# Patient Record
Sex: Female | Born: 1940 | Race: Black or African American | Hispanic: No | Smoking: Never smoker
Health system: Southern US, Community
[De-identification: ages and names within clinical notes are randomized; demographics above are authoritative.]

## PROBLEM LIST (undated history)

## (undated) DIAGNOSIS — M810 Age-related osteoporosis without current pathological fracture: Secondary | ICD-10-CM

## (undated) DIAGNOSIS — M1990 Unspecified osteoarthritis, unspecified site: Secondary | ICD-10-CM

## (undated) DIAGNOSIS — H269 Unspecified cataract: Secondary | ICD-10-CM

## (undated) DIAGNOSIS — C50919 Malignant neoplasm of unspecified site of unspecified female breast: Secondary | ICD-10-CM

## (undated) DIAGNOSIS — H409 Unspecified glaucoma: Secondary | ICD-10-CM

## (undated) HISTORY — PX: BREAST SURGERY: SHX581

## (undated) HISTORY — PX: ABDOMINAL HYSTERECTOMY: SHX81

## (undated) HISTORY — DX: Unspecified cataract: H26.9

## (undated) HISTORY — DX: Unspecified glaucoma: H40.9

## (undated) HISTORY — DX: Age-related osteoporosis without current pathological fracture: M81.0

## (undated) HISTORY — DX: Malignant neoplasm of unspecified site of unspecified female breast: C50.919

## (undated) HISTORY — DX: Unspecified osteoarthritis, unspecified site: M19.90

---

## 2012-02-07 DIAGNOSIS — R6889 Other general symptoms and signs: Secondary | ICD-10-CM | POA: Diagnosis not present

## 2012-02-07 DIAGNOSIS — Z1501 Genetic susceptibility to malignant neoplasm of breast: Secondary | ICD-10-CM | POA: Diagnosis not present

## 2012-03-20 DIAGNOSIS — Z1212 Encounter for screening for malignant neoplasm of rectum: Secondary | ICD-10-CM | POA: Diagnosis not present

## 2012-03-20 DIAGNOSIS — Z0000 Encounter for general adult medical examination without abnormal findings: Secondary | ICD-10-CM | POA: Diagnosis not present

## 2012-03-21 DIAGNOSIS — M810 Age-related osteoporosis without current pathological fracture: Secondary | ICD-10-CM | POA: Diagnosis not present

## 2012-03-21 DIAGNOSIS — C50919 Malignant neoplasm of unspecified site of unspecified female breast: Secondary | ICD-10-CM | POA: Diagnosis not present

## 2012-04-07 DIAGNOSIS — Z780 Asymptomatic menopausal state: Secondary | ICD-10-CM | POA: Diagnosis not present

## 2012-04-07 DIAGNOSIS — C50919 Malignant neoplasm of unspecified site of unspecified female breast: Secondary | ICD-10-CM | POA: Diagnosis not present

## 2012-04-07 DIAGNOSIS — M810 Age-related osteoporosis without current pathological fracture: Secondary | ICD-10-CM | POA: Diagnosis not present

## 2012-08-26 DIAGNOSIS — H4011X0 Primary open-angle glaucoma, stage unspecified: Secondary | ICD-10-CM | POA: Diagnosis not present

## 2012-08-26 DIAGNOSIS — H409 Unspecified glaucoma: Secondary | ICD-10-CM | POA: Diagnosis not present

## 2012-08-26 DIAGNOSIS — H524 Presbyopia: Secondary | ICD-10-CM | POA: Diagnosis not present

## 2012-08-28 DIAGNOSIS — C50919 Malignant neoplasm of unspecified site of unspecified female breast: Secondary | ICD-10-CM | POA: Diagnosis not present

## 2012-08-28 DIAGNOSIS — K219 Gastro-esophageal reflux disease without esophagitis: Secondary | ICD-10-CM | POA: Diagnosis not present

## 2012-09-09 DIAGNOSIS — Z23 Encounter for immunization: Secondary | ICD-10-CM | POA: Diagnosis not present

## 2012-12-27 ENCOUNTER — Ambulatory Visit (INDEPENDENT_AMBULATORY_CARE_PROVIDER_SITE_OTHER): Payer: Medicare Other | Admitting: Emergency Medicine

## 2012-12-27 VITALS — BP 144/68 | HR 77 | Temp 99.0°F | Resp 16 | Ht 64.5 in | Wt 128.0 lb

## 2012-12-27 DIAGNOSIS — R42 Dizziness and giddiness: Secondary | ICD-10-CM | POA: Diagnosis not present

## 2012-12-27 LAB — POCT CBC
HCT, POC: 44.9 % (ref 37.7–47.9)
Lymph, poc: 2.1 (ref 0.6–3.4)
MCH, POC: 30.7 pg (ref 27–31.2)
MCHC: 31.8 g/dL (ref 31.8–35.4)
MCV: 96.4 fL (ref 80–97)
MID (cbc): 0.4 (ref 0–0.9)
POC LYMPH PERCENT: 33.1 %L (ref 10–50)
Platelet Count, POC: 187 10*3/uL (ref 142–424)
RDW, POC: 13.8 %
WBC: 6.3 10*3/uL (ref 4.6–10.2)

## 2012-12-27 MED ORDER — MECLIZINE HCL 25 MG PO TABS: 25.0000 mg | ORAL_TABLET | Freq: Three times a day (TID) | ORAL | Status: DC | PRN

## 2012-12-27 NOTE — Progress Notes (Signed)
[facility] [address] [place] [address] [phone]  Date:  12/27/2012   Name:  Wanda Rivera   DOB:  [birth year]   MRN:  [id number]  PCP:  No primary provider on file.    Chief Complaint: Dizziness and Nausea   History of Present Illness:  Wanda Rivera is a 71 y.o. very pleasant female patient who presents with the following:  10 day history of intermittent lightheadedness.  Denies dizziness.  Some nausea with lightheadedness.  No vomiting.  No antecedent illness or injury.  No fever or chills.  No cough, coryza, headache.  No neuro or visual symptoms.  No stool change.  No rash.  No difficulty with gait balance or coordination.  No chest pain, tightness or shortness of breath or tachycardia or palpitations.  No slurred speech, weakness, facial asymmetry, no dysphasia.  Describes lightheadedness as being postural and not associated with position changes or her head.  There is no problem list on file for this patient.   Past Medical History  Diagnosis Date  . Arthritis   . Cancer   . Osteoporosis     Past Surgical History  Procedure Date  . Breast surgery   . Abdominal hysterectomy     History  Substance Use Topics  . Smoking status: Never Smoker   . Smokeless tobacco: Not on file  . Alcohol Use: No    Family History  Problem Relation Age of Onset  . Cancer Mother   . Diabetes Father   . Hypertension Sister   . Diabetes Brother   . Heart disease Brother     No Known Allergies  Medication list has been reviewed and updated.  Current Outpatient Prescriptions on File Prior to Visit  Medication Sig Dispense Refill  . omeprazole (PRILOSEC) 20 MG capsule Take 20 mg by mouth 2 (two) times daily.        Review of Systems:  As per HPI, otherwise negative.    Physical Examination: Filed Vitals:   12/27/12 1624  BP: 144/68  Pulse: 77  Temp: 99 F (37.2 C)  Resp: 16   Filed Vitals:   12/27/12 1624  Height: 5' 4.5" (1.638  m)  Weight: 128 lb (58.06 kg)   Body mass index is 21.63 kg/(m^2). Ideal Body Weight: Weight in (lb) to have BMI = 25: 147.6   GEN: WDWN, NAD, Non-toxic, A & O x 3  No sepsis or rash HEENT: Atraumatic, Normocephalic. Neck supple. No masses, No LAD.  Oropharynx negative.  PRRERLA EOMI CN2-12 intact Ears and Nose: No external deformity.  TM negative CV: RRR, No M/G/R. No JVD. No thrill. No extra heart sounds. PULM: CTA B, no wheezes, crackles, rhonchi. No retractions. No resp. distress. No accessory muscle use. ABD: S, NT, ND, +BS. No rebound. No HSM. EXTR: No c/c/e NEURO Normal gait, balance and coordination PSYCH: Normally interactive. Conversant. Not depressed or anxious appearing.  Calm demeanor.    Assessment and Plan:  Lightheadedness Labs Follow up based on labs antivert for nausea Consider cardiology consult after labs   Carmelina Dane, MD  Results for orders placed in visit on 12/27/12  POCT CBC      Component Value Range   WBC 6.3  4.6 - 10.2 K/uL   Lymph, poc 2.1  0.6 - 3.4   POC LYMPH PERCENT 33.1  10 - 50 %L   MID (cbc) 0.4  0 - 0.9   POC MID % 6.9  0 -  12 %M   POC Granulocyte 3.8  2 - 6.9   Granulocyte percent 60.0  37 - 80 %G   RBC 4.66  4.04 - 5.48 M/uL   Hemoglobin 14.3  12.2 - 16.2 g/dL   HCT, POC 96.0  45.4 - 47.9 %   MCV 96.4  80 - 97 fL   MCH, POC 30.7  27 - 31.2 pg   MCHC 31.8  31.8 - 35.4 g/dL   RDW, POC 09.8     Platelet Count, POC 187  142 - 424 K/uL   MPV 9.1  0 - 99.8 fL

## 2012-12-28 LAB — COMPREHENSIVE METABOLIC PANEL
AST: 20 U/L (ref 0–37)
Albumin: 4.1 g/dL (ref 3.5–5.2)
Alkaline Phosphatase: 73 U/L (ref 39–117)
BUN: 18 mg/dL (ref 6–23)
Creat: 0.99 mg/dL (ref 0.50–1.10)
Glucose, Bld: 93 mg/dL (ref 70–99)
Potassium: 4.2 mEq/L (ref 3.5–5.3)
Total Bilirubin: 0.4 mg/dL (ref 0.3–1.2)

## 2013-01-05 ENCOUNTER — Telehealth: Payer: Self-pay

## 2013-01-05 NOTE — Telephone Encounter (Signed)
Pt would like to know the results of the labs that she had done on 12/27/12, she is worried because she has not heard anything from [place]. Please advise.  Betst# [phone]

## 2013-01-06 NOTE — Telephone Encounter (Signed)
Can you please review pt labs?

## 2013-01-06 NOTE — Telephone Encounter (Signed)
Please let patient know they are normal

## 2013-01-09 DIAGNOSIS — H4011X0 Primary open-angle glaucoma, stage unspecified: Secondary | ICD-10-CM | POA: Diagnosis not present

## 2013-01-11 NOTE — Telephone Encounter (Signed)
Pt.notified

## 2013-01-22 DIAGNOSIS — H4011X0 Primary open-angle glaucoma, stage unspecified: Secondary | ICD-10-CM | POA: Diagnosis not present

## 2013-02-16 DIAGNOSIS — H4011X0 Primary open-angle glaucoma, stage unspecified: Secondary | ICD-10-CM | POA: Diagnosis not present

## 2013-03-09 DIAGNOSIS — H4011X0 Primary open-angle glaucoma, stage unspecified: Secondary | ICD-10-CM | POA: Diagnosis not present

## 2013-03-30 DIAGNOSIS — H4011X0 Primary open-angle glaucoma, stage unspecified: Secondary | ICD-10-CM | POA: Diagnosis not present

## 2013-04-09 DIAGNOSIS — E785 Hyperlipidemia, unspecified: Secondary | ICD-10-CM | POA: Diagnosis not present

## 2013-04-09 DIAGNOSIS — Z0000 Encounter for general adult medical examination without abnormal findings: Secondary | ICD-10-CM | POA: Diagnosis not present

## 2013-04-09 DIAGNOSIS — R109 Unspecified abdominal pain: Secondary | ICD-10-CM | POA: Diagnosis not present

## 2013-04-09 DIAGNOSIS — Z1501 Genetic susceptibility to malignant neoplasm of breast: Secondary | ICD-10-CM | POA: Diagnosis not present

## 2013-04-09 DIAGNOSIS — K219 Gastro-esophageal reflux disease without esophagitis: Secondary | ICD-10-CM | POA: Diagnosis not present

## 2013-04-09 DIAGNOSIS — M810 Age-related osteoporosis without current pathological fracture: Secondary | ICD-10-CM | POA: Diagnosis not present

## 2013-04-09 DIAGNOSIS — Z124 Encounter for screening for malignant neoplasm of cervix: Secondary | ICD-10-CM | POA: Diagnosis not present

## 2013-04-09 DIAGNOSIS — R42 Dizziness and giddiness: Secondary | ICD-10-CM | POA: Diagnosis not present

## 2013-04-10 DIAGNOSIS — Z79899 Other long term (current) drug therapy: Secondary | ICD-10-CM | POA: Diagnosis not present

## 2013-04-10 DIAGNOSIS — E785 Hyperlipidemia, unspecified: Secondary | ICD-10-CM | POA: Diagnosis not present

## 2013-04-17 DIAGNOSIS — N949 Unspecified condition associated with female genital organs and menstrual cycle: Secondary | ICD-10-CM | POA: Diagnosis not present

## 2013-04-17 DIAGNOSIS — R109 Unspecified abdominal pain: Secondary | ICD-10-CM | POA: Diagnosis not present

## 2013-04-17 DIAGNOSIS — Z853 Personal history of malignant neoplasm of breast: Secondary | ICD-10-CM | POA: Diagnosis not present

## 2013-05-05 DIAGNOSIS — H4011X0 Primary open-angle glaucoma, stage unspecified: Secondary | ICD-10-CM | POA: Diagnosis not present

## 2013-06-23 DIAGNOSIS — H4011X0 Primary open-angle glaucoma, stage unspecified: Secondary | ICD-10-CM | POA: Diagnosis not present

## 2013-07-28 DIAGNOSIS — H409 Unspecified glaucoma: Secondary | ICD-10-CM | POA: Diagnosis not present

## 2013-07-28 DIAGNOSIS — H4011X0 Primary open-angle glaucoma, stage unspecified: Secondary | ICD-10-CM | POA: Diagnosis not present

## 2013-08-02 ENCOUNTER — Ambulatory Visit (INDEPENDENT_AMBULATORY_CARE_PROVIDER_SITE_OTHER): Payer: Medicare Other | Admitting: Family Medicine

## 2013-08-02 VITALS — BP 142/80 | HR 55 | Temp 98.0°F | Resp 16 | Ht 65.0 in | Wt 129.2 lb

## 2013-08-02 DIAGNOSIS — R110 Nausea: Secondary | ICD-10-CM | POA: Diagnosis not present

## 2013-08-02 DIAGNOSIS — R001 Bradycardia, unspecified: Secondary | ICD-10-CM

## 2013-08-02 DIAGNOSIS — R42 Dizziness and giddiness: Secondary | ICD-10-CM | POA: Diagnosis not present

## 2013-08-02 DIAGNOSIS — R2681 Unsteadiness on feet: Secondary | ICD-10-CM

## 2013-08-02 DIAGNOSIS — R51 Headache: Secondary | ICD-10-CM | POA: Diagnosis not present

## 2013-08-02 LAB — COMPREHENSIVE METABOLIC PANEL WITH GFR
Albumin: 4.3 g/dL (ref 3.5–5.2)
Alkaline Phosphatase: 78 U/L (ref 39–117)
BUN: 13 mg/dL (ref 6–23)
CO2: 29 meq/L (ref 19–32)
Calcium: 9.7 mg/dL (ref 8.4–10.5)
Chloride: 102 meq/L (ref 96–112)
Glucose, Bld: 97 mg/dL (ref 70–99)
Potassium: 5 meq/L (ref 3.5–5.3)
Total Protein: 6.7 g/dL (ref 6.0–8.3)

## 2013-08-02 LAB — POCT URINALYSIS DIPSTICK
Bilirubin, UA: NEGATIVE
Blood, UA: NEGATIVE
Glucose, UA: NEGATIVE
Ketones, UA: NEGATIVE
Leukocytes, UA: NEGATIVE
Nitrite, UA: NEGATIVE
Protein, UA: NEGATIVE
Spec Grav, UA: 1.01
Urobilinogen, UA: 0.2
pH, UA: 7

## 2013-08-02 LAB — POCT UA - MICROSCOPIC ONLY
Bacteria, U Microscopic: NEGATIVE
Casts, Ur, LPF, POC: NEGATIVE
Crystals, Ur, HPF, POC: NEGATIVE
Epithelial cells, urine per micros: NEGATIVE
Mucus, UA: NEGATIVE
RBC, urine, microscopic: NEGATIVE
WBC, Ur, HPF, POC: NEGATIVE
Yeast, UA: NEGATIVE

## 2013-08-02 LAB — POCT CBC
Granulocyte percent: 70.6 %G (ref 37–80)
HCT, POC: 45.4 % (ref 37.7–47.9)
Hemoglobin: 14.7 g/dL (ref 12.2–16.2)
Lymph, poc: 1.5 (ref 0.6–3.4)
MCH, POC: 31.3 pg — AB (ref 27–31.2)
MCHC: 32.4 g/dL (ref 31.8–35.4)
MCV: 96.5 fL (ref 80–97)
MID (cbc): 0.3 (ref 0–0.9)
MPV: 9.5 fL (ref 0–99.8)
POC Granulocyte: 4.4 (ref 2–6.9)
POC LYMPH PERCENT: 24.2 %L (ref 10–50)
POC MID %: 5.2 % (ref 0–12)
Platelet Count, POC: 188 10*3/uL (ref 142–424)
RBC: 4.7 M/uL (ref 4.04–5.48)
RDW, POC: 13.8 %
WBC: 6.3 10*3/uL (ref 4.6–10.2)

## 2013-08-02 LAB — COMPREHENSIVE METABOLIC PANEL
ALT: 11 U/L (ref 0–35)
AST: 21 U/L (ref 0–37)
Creat: 1.07 mg/dL (ref 0.50–1.10)
Sodium: 140 mEq/L (ref 135–145)
Total Bilirubin: 0.6 mg/dL (ref 0.3–1.2)

## 2013-08-02 NOTE — Patient Instructions (Addendum)
Vertigo Vertigo means you feel like you or your surroundings are moving when they are not. Vertigo can be dangerous if it occurs when you are at work, driving, or performing difficult activities.  CAUSES  Vertigo occurs when there is a conflict of signals sent to your brain from the visual and sensory systems in your body. There are many different causes of vertigo, including:  Infections, especially in the inner ear.  A bad reaction to a drug or misuse of alcohol and medicines.  Withdrawal from drugs or alcohol.  Rapidly changing positions, such as lying down or rolling over in bed.  A migraine headache.  Decreased blood flow to the brain.  Increased pressure in the brain from a head injury, infection, tumor, or bleeding. SYMPTOMS  You may feel as though the world is spinning around or you are falling to the ground. Because your balance is upset, vertigo can cause nausea and vomiting. You may have involuntary eye movements (nystagmus). DIAGNOSIS  Vertigo is usually diagnosed by physical exam. If the cause of your vertigo is unknown, your caregiver may perform imaging tests, such as an MRI scan (magnetic resonance imaging). TREATMENT  Most cases of vertigo resolve on their own, without treatment. Depending on the cause, your caregiver may prescribe certain medicines. If your vertigo is related to body position issues, your caregiver may recommend movements or procedures to correct the problem. In rare cases, if your vertigo is caused by certain inner ear problems, you may need surgery. HOME CARE INSTRUCTIONS   Follow your caregiver's instructions.  Avoid driving.  Avoid operating heavy machinery.  Avoid performing any tasks that would be dangerous to you or others during a vertigo episode.  Tell your caregiver if you notice that certain medicines seem to be causing your vertigo. Some of the medicines used to treat vertigo episodes can actually make them worse in some people. SEEK  IMMEDIATE MEDICAL CARE IF:   Your medicines do not relieve your vertigo or are making it worse.  You develop problems with talking, walking, weakness, or using your arms, hands, or legs.  You develop severe headaches.  Your nausea or vomiting continues or gets worse.  You develop visual changes.  A family member notices behavioral changes.  Your condition gets worse. MAKE SURE YOU:  Understand these instructions.  Will watch your condition.  Will get help right away if you are not doing well or get worse. Document Released: 09/26/2005 Document Revised: 03/10/2012 Document Reviewed: 07/05/2011 Exit[employer] Patient Information 2014 [employer].  

## 2013-08-02 NOTE — Progress Notes (Signed)
[facility]:  Office Visit  Chief Complaint:  Chief Complaint  Patient presents with  . Nausea    x 2 days but worse today  . Headache  . Dizziness    HPI: Wanda Rivera is a 72 y.o. female who complains of  3 days ago of feeling stomach feeling out of sorts, this morning she was leaning down to get something from laundry basketball and she was dizzy when she tried to get up.  She feels nauseated. She has HA with this on the top of her head and also temporal area. She does have reflux. Her sister had a subarachnoid hemorrhage. She has pressure along her nasal area, she has lightheadedness when she moves her head. Was previosuly treated for vertigo and took medicine that was rx by Dr. Dareen Piano for a couple of months which helped. She denies having any CP, assymetric weakness, facial changes, stroke like sxs, confusion.  She does not know if moking her head makes it worse. She knows that walking can make her feel dizzy. Denies any URI sxs, ear infections. She denies any light sensitivity/photophobia, vision changes. No risk factors for heart disease ie DM, HTN, tobacco use, XOL.  She moved here from [place], she only has one son and he is here in the [employer]  She is a breast cancer survivor.   Past Medical History  Diagnosis Date  . Arthritis   . Osteoporosis   . Cataract   . Glaucoma   . Cancer     1982, recurrence in 1985   Past Surgical History  Procedure Laterality Date  . Abdominal hysterectomy    . Breast surgery      double masectomy for breast cancer   History   Social History  . Marital Status: Divorced    Spouse Name: N/A    Number of Children: N/A  . Years of Education: N/A   Social History Main Topics  . Smoking status: Never Smoker   . Smokeless tobacco: None  . Alcohol Use: No  . Drug Use: No  . Sexually Active: No   Other Topics Concern  . None   Social History Narrative  . None   Family History  Problem Relation Age of  Onset  . Cancer Mother   . Diabetes Father   . Hypertension Father   . Hypertension Sister   . Diabetes Brother   . Heart disease Brother    No Known Allergies Prior to Admission medications   Medication Sig Start Date End Date Taking? Authorizing Provider  omeprazole (PRILOSEC) 20 MG capsule Take 20 mg by mouth 2 (two) times daily.   Yes Historical Provider, MD  meclizine (ANTIVERT) 25 MG tablet Take 1 tablet (25 mg total) by mouth 3 (three) times daily as needed. 12/27/12   Phillips Odor, MD     ROS: The patient denies fevers, chills, night sweats, unintentional weight loss, chest pain, palpitations, wheezing, dyspnea on exertion, nausea, vomiting, abdominal pain, dysuria, hematuria, melena, numbness, weakness, or tingling.   All other systems have been reviewed and were otherwise negative with the exception of those mentioned in the HPI and as above.    PHYSICAL EXAM: Filed Vitals:   08/02/13 1352  BP: 142/80  Pulse: 55  Temp: 98 F (36.7 C)  Resp: 16   Filed Vitals:   08/02/13 1352  Height: 5\' 5"  (1.651 m)  Weight: 129 lb 3.2 oz (58.605 kg)   Body mass index is 21.5 kg/(m^2).  General: Alert, no acute distress HEENT:  Normocephalic, atraumatic, oropharynx patent. EOMI, PERRLA , fundoscopic exam nl Cardiovascular:  Regular rate and rhythm, no rubs murmurs or gallops.  No Carotid bruits, radial pulse intact. No pedal edema.  Respiratory: Clear to auscultation bilaterally.  No wheezes, rales, or rhonchi.  No cyanosis, no use of accessory musculature GI: No organomegaly, abdomen is soft and non-tender, positive bowel sounds.  No masses. Skin: No rashes. Neurologic: Facial musculature symmetric. CN 2-11 grossly normal Psychiatric: Patient is appropriate throughout our interaction. Lymphatic: No cervical lymphadenopathy Musculoskeletal: Generalized walking gait intact. Heel to toe--failed, she had unstable gait with this  Romberg was negative Gilberto Better was  negative   LABS: Results for orders placed in visit on 08/02/13  POCT CBC      Result Value Range   WBC 6.3  4.6 - 10.2 K/uL   Lymph, poc 1.5  0.6 - 3.4   POC LYMPH PERCENT 24.2  10 - 50 %L   MID (cbc) 0.3  0 - 0.9   POC MID % 5.2  0 - 12 %M   POC Granulocyte 4.4  2 - 6.9   Granulocyte percent 70.6  37 - 80 %G   RBC 4.70  4.04 - 5.48 M/uL   Hemoglobin 14.7  12.2 - 16.2 g/dL   HCT, POC 40.9  81.1 - 47.9 %   MCV 96.5  80 - 97 fL   MCH, POC 31.3 (*) 27 - 31.2 pg   MCHC 32.4  31.8 - 35.4 g/dL   RDW, POC 91.4     Platelet Count, POC 188  142 - 424 K/uL   MPV 9.5  0 - 99.8 fL  POCT UA - MICROSCOPIC ONLY      Result Value Range   WBC, Ur, HPF, POC neg     RBC, urine, microscopic neg     Bacteria, U Microscopic neg     Mucus, UA neg     Epithelial cells, urine per micros neg     Crystals, Ur, HPF, POC neg     Casts, Ur, LPF, POC neg     Yeast, UA neg    POCT URINALYSIS DIPSTICK      Result Value Range   Color, UA yellow     Clarity, UA clear     Glucose, UA neg     Bilirubin, UA neg     Ketones, UA neg     Spec Grav, UA 1.010     Blood, UA neg     pH, UA 7.0     Protein, UA neg     Urobilinogen, UA 0.2     Nitrite, UA neg     Leukocytes, UA Negative       EKG/XRAY:   Primary read interpreted by Dr. Conley Rolls at [facility]. Sinus brady at 54 bpm, no ST elevation   ASSESSMENT/PLAN: Encounter Diagnoses  Name Primary?  . Nausea alone Yes  . Dizziness   . Headache(784.0)    Sinus Bradycardia   . Unsteady gait    She most likely has an element of orthostatic BP and benign positional vertigo since usually with getting up nad down/head movement Her gait was unsteady with heel-to-toe, recommend CT Head scan if no improvement with meclizine otc. She declines to get t CT scan of head.  She also had sinus bradycardia which was not evident on last office visit 6 months ago. I will  refer to cardiology to make sure this is not the  cause of her sxs.  She will go to the ER prn for  worsening sxs.  Labs pending F/u prn    LE, THAO PHUONG, DO 08/02/2013 3:30 PM   08/05/13-spoke with patient she is doing about the same, increase meclizine to TID which she has been on ly taking daily. Labs were normal. Will refer and she will f.u prn if worsenign sxs or go to ER

## 2013-08-18 DIAGNOSIS — R42 Dizziness and giddiness: Secondary | ICD-10-CM | POA: Diagnosis not present

## 2013-08-18 DIAGNOSIS — I498 Other specified cardiac arrhythmias: Secondary | ICD-10-CM | POA: Diagnosis not present

## 2013-09-08 DIAGNOSIS — H2510 Age-related nuclear cataract, unspecified eye: Secondary | ICD-10-CM | POA: Diagnosis not present

## 2013-09-08 DIAGNOSIS — H4010X0 Unspecified open-angle glaucoma, stage unspecified: Secondary | ICD-10-CM | POA: Diagnosis not present

## 2013-09-08 DIAGNOSIS — H18419 Arcus senilis, unspecified eye: Secondary | ICD-10-CM | POA: Diagnosis not present

## 2013-09-08 DIAGNOSIS — H409 Unspecified glaucoma: Secondary | ICD-10-CM | POA: Diagnosis not present

## 2013-09-21 DIAGNOSIS — Z23 Encounter for immunization: Secondary | ICD-10-CM | POA: Diagnosis not present

## 2013-09-21 DIAGNOSIS — K219 Gastro-esophageal reflux disease without esophagitis: Secondary | ICD-10-CM | POA: Diagnosis not present

## 2013-09-21 DIAGNOSIS — R42 Dizziness and giddiness: Secondary | ICD-10-CM | POA: Diagnosis not present

## 2013-09-21 DIAGNOSIS — M810 Age-related osteoporosis without current pathological fracture: Secondary | ICD-10-CM | POA: Diagnosis not present

## 2013-09-24 DIAGNOSIS — Z853 Personal history of malignant neoplasm of breast: Secondary | ICD-10-CM | POA: Diagnosis not present

## 2013-09-24 DIAGNOSIS — R42 Dizziness and giddiness: Secondary | ICD-10-CM | POA: Diagnosis not present

## 2013-09-24 DIAGNOSIS — R51 Headache: Secondary | ICD-10-CM | POA: Diagnosis not present

## 2013-10-06 DIAGNOSIS — H4010X0 Unspecified open-angle glaucoma, stage unspecified: Secondary | ICD-10-CM | POA: Diagnosis not present

## 2013-10-06 DIAGNOSIS — H409 Unspecified glaucoma: Secondary | ICD-10-CM | POA: Diagnosis not present

## 2013-10-13 DIAGNOSIS — H4011X0 Primary open-angle glaucoma, stage unspecified: Secondary | ICD-10-CM | POA: Diagnosis not present

## 2013-10-27 DIAGNOSIS — H409 Unspecified glaucoma: Secondary | ICD-10-CM | POA: Diagnosis not present

## 2013-10-27 DIAGNOSIS — H4010X0 Unspecified open-angle glaucoma, stage unspecified: Secondary | ICD-10-CM | POA: Diagnosis not present

## 2013-11-03 DIAGNOSIS — H4011X0 Primary open-angle glaucoma, stage unspecified: Secondary | ICD-10-CM | POA: Diagnosis not present

## 2014-03-05 DIAGNOSIS — H4011X0 Primary open-angle glaucoma, stage unspecified: Secondary | ICD-10-CM | POA: Diagnosis not present

## 2014-03-05 DIAGNOSIS — H2510 Age-related nuclear cataract, unspecified eye: Secondary | ICD-10-CM | POA: Diagnosis not present

## 2014-03-05 DIAGNOSIS — H409 Unspecified glaucoma: Secondary | ICD-10-CM | POA: Diagnosis not present

## 2014-05-17 DIAGNOSIS — M810 Age-related osteoporosis without current pathological fracture: Secondary | ICD-10-CM | POA: Diagnosis not present

## 2014-05-17 DIAGNOSIS — C50919 Malignant neoplasm of unspecified site of unspecified female breast: Secondary | ICD-10-CM | POA: Diagnosis not present

## 2014-05-17 DIAGNOSIS — E785 Hyperlipidemia, unspecified: Secondary | ICD-10-CM | POA: Diagnosis not present

## 2014-05-17 DIAGNOSIS — Z0000 Encounter for general adult medical examination without abnormal findings: Secondary | ICD-10-CM | POA: Diagnosis not present

## 2014-05-17 DIAGNOSIS — K219 Gastro-esophageal reflux disease without esophagitis: Secondary | ICD-10-CM | POA: Diagnosis not present

## 2014-05-17 DIAGNOSIS — R42 Dizziness and giddiness: Secondary | ICD-10-CM | POA: Diagnosis not present

## 2014-05-18 DIAGNOSIS — Z01419 Encounter for gynecological examination (general) (routine) without abnormal findings: Secondary | ICD-10-CM | POA: Diagnosis not present

## 2014-05-18 DIAGNOSIS — Z79899 Other long term (current) drug therapy: Secondary | ICD-10-CM | POA: Diagnosis not present

## 2014-05-18 DIAGNOSIS — H409 Unspecified glaucoma: Secondary | ICD-10-CM | POA: Diagnosis not present

## 2014-05-18 DIAGNOSIS — E785 Hyperlipidemia, unspecified: Secondary | ICD-10-CM | POA: Diagnosis not present

## 2014-05-18 DIAGNOSIS — Z1501 Genetic susceptibility to malignant neoplasm of breast: Secondary | ICD-10-CM | POA: Diagnosis not present

## 2014-06-14 DIAGNOSIS — H2510 Age-related nuclear cataract, unspecified eye: Secondary | ICD-10-CM | POA: Diagnosis not present

## 2014-06-14 DIAGNOSIS — H409 Unspecified glaucoma: Secondary | ICD-10-CM | POA: Diagnosis not present

## 2014-06-14 DIAGNOSIS — H4011X0 Primary open-angle glaucoma, stage unspecified: Secondary | ICD-10-CM | POA: Diagnosis not present

## 2014-07-23 ENCOUNTER — Ambulatory Visit (INDEPENDENT_AMBULATORY_CARE_PROVIDER_SITE_OTHER): Payer: Medicare Other

## 2014-07-23 ENCOUNTER — Ambulatory Visit (INDEPENDENT_AMBULATORY_CARE_PROVIDER_SITE_OTHER): Payer: Medicare Other | Admitting: Family Medicine

## 2014-07-23 VITALS — BP 138/70 | HR 52 | Temp 97.5°F | Resp 16 | Ht 65.0 in | Wt 135.0 lb

## 2014-07-23 DIAGNOSIS — M25519 Pain in unspecified shoulder: Secondary | ICD-10-CM | POA: Diagnosis not present

## 2014-07-23 DIAGNOSIS — S43499A Other sprain of unspecified shoulder joint, initial encounter: Secondary | ICD-10-CM

## 2014-07-23 DIAGNOSIS — M25511 Pain in right shoulder: Secondary | ICD-10-CM

## 2014-07-23 DIAGNOSIS — S46811A Strain of other muscles, fascia and tendons at shoulder and upper arm level, right arm, initial encounter: Secondary | ICD-10-CM

## 2014-07-23 DIAGNOSIS — S46819A Strain of other muscles, fascia and tendons at shoulder and upper arm level, unspecified arm, initial encounter: Secondary | ICD-10-CM

## 2014-07-23 NOTE — Progress Notes (Addendum)
[facility] [address], [place] [address] [phone]  Date:  07/23/2014   Name:  Wanda Rivera   DOB:  October 04, 1941   MRN:  [id number]  PCP:  No primary provider on file.    Chief Complaint: Shoulder Pain, Tingling, Neck Pain and Chest Pain   History of Present Illness:  Wanda Rivera is a 73 y.o. very pleasant female patient who presents with the following:  Here today with a possible MSK concern.   It is now Friday night.  On Monday she noted onset of pain in her right shoulder blade.  She is not aware of any particular injury or unusual activity.  Insidious onset.  The pain stayed in her back, and she tried some tylenol.  The pain then moved into the right side of her neck, and then in the right upper chest over the last 2-3 days.  Also, she notes tingling in her right arm.  (She does not have much feeling in her arm to begin with due to her mastectomy.)  She has noted this tingling for a couple of days.   She has never had this sort of problem in the past.    She generally exercises on her eliptical machine; she may do this a few times a week.  No isses with CP or SOB while she is on the eliptical.    Otherwise she feels well, no fever or other systemic sx History of breast cancer and recurrence of same with resultant double mastectomy   There are no active problems to display for this patient.   Past Medical History  Diagnosis Date  . Arthritis   . Osteoporosis   . Cataract   . Glaucoma   . Cancer     1982, recurrence in 1985    Past Surgical History  Procedure Laterality Date  . Abdominal hysterectomy    . Breast surgery      double masectomy for breast cancer    History  Substance Use Topics  . Smoking status: Never Smoker   . Smokeless tobacco: Not on file  . Alcohol Use: No    Family History  Problem Relation Age of Onset  . Cancer Mother   . Diabetes Father   . Hypertension Father   . Hypertension Sister   . Diabetes  Brother   . Heart disease Brother     No Known Allergies  Medication list has been reviewed and updated.  Current Outpatient Prescriptions on File Prior to Visit  Medication Sig Dispense Refill  . omeprazole (PRILOSEC) 20 MG capsule Take 20 mg by mouth 2 (two) times daily.       No current facility-administered medications on file prior to visit.    Review of Systems:  As per HPI- otherwise negative.   Physical Examination: Filed Vitals:   07/23/14 1735  BP: 138/70  Pulse: 60  Temp: 97.5 F (36.4 C)  Resp: 16   Filed Vitals:   07/23/14 1735  Height: 5\' 5"  (1.651 m)  Weight: 135 lb (61.236 kg)   Body mass index is 22.47 kg/(m^2). Ideal Body Weight: Weight in (lb) to have BMI = 25: 149.9  GEN: WDWN, NAD, Non-toxic, A & O x 3, slim build, looks well and younger than age 60: Atraumatic, Normocephalic. Neck supple. No masses, No LAD.  Bilateral TM wnl, oropharynx normal.  PEERL,EOMI.   Normal motion of the cervical spine and no bony tenderness.  Her "neck" tenderness is in the  trapezius muscle Ears and Nose: No external deformity. CV: RRR, No M/G/R. No JVD. No thrill. No extra heart sounds. PULM: CTA B, no wheezes, crackles, rhonchi. No retractions. No resp. distress. No accessory muscle use. ABD: S, NT, ND EXTR: No c/c/e NEURO Normal gait.  PSYCH: Normally interactive. Conversant. Not depressed or anxious appearing.  Calm demeanor.  Normal ROM with no pain in right shoulder.  Tender over the right superior trapezius muscles, and in the right pectoralis especially towards the axilla.  No skin lesion or sign of shingles.   S/p bilateral mastectomy   [facility] reading (PRIMARY) by  Dr. Lorelei Pont. CXR: s/p mastectomy, otherwise negative  CHEST 2 VIEW  COMPARISON: None.  FINDINGS: Cardiac silhouette normal in size. Thoracic aorta mildly atherosclerotic consistent with age. Hilar and mediastinal contours otherwise unremarkable. Lungs clear. Bronchovascular  markings normal. Pulmonary vascularity normal. No visible pleural effusions. No pneumothorax. Visualized bony thorax intact. Bilateral mastectomies and axillary node dissection.  IMPRESSION: No acute cardiopulmonary disease.  No significant discrepancy with the original interpretation by Dr. Lorelei Pont.   EKG: NSR, no ST elevation or depression.  Slight bradycardia  Assessment and Plan: Right shoulder pain - Plan: EKG 12-Lead  Trapezius strain, right, initial encounter - Plan: DG Chest 2 View  Right sided likely MSK pain in the upper back and chest.  Discussed in detail with pt.  My evaluation using the resources I have at clinic does not suggest any dangerous etiology of her pain.  However I am glad to arrange an ER visit for further evaluation and testing as necessary.  At this time she feels comfortable observing her sx and will use tylenol as needed.  As she has had breast cancer twice encouraged her to follow-up if her pain persists beyond a week- in that case a CT might be indicated.   Signed Lamar Blinks, MD

## 2014-07-23 NOTE — Patient Instructions (Addendum)
Use tylenol and a heating pad as needed.  Let me know if your symtoms do not resolve in the next week or so.  If you get worse please seek care right away.

## 2014-08-11 ENCOUNTER — Ambulatory Visit (INDEPENDENT_AMBULATORY_CARE_PROVIDER_SITE_OTHER): Payer: Medicare Other | Admitting: Family Medicine

## 2014-08-11 VITALS — BP 123/72 | HR 54 | Temp 98.6°F | Resp 18 | Wt 131.0 lb

## 2014-08-11 DIAGNOSIS — M25519 Pain in unspecified shoulder: Secondary | ICD-10-CM | POA: Diagnosis not present

## 2014-08-11 DIAGNOSIS — Z853 Personal history of malignant neoplasm of breast: Secondary | ICD-10-CM

## 2014-08-11 DIAGNOSIS — M25511 Pain in right shoulder: Secondary | ICD-10-CM

## 2014-08-11 LAB — COMPLETE METABOLIC PANEL WITH GFR
ALBUMIN: 4.1 g/dL (ref 3.5–5.2)
ALK PHOS: 70 U/L (ref 39–117)
ALT: 14 U/L (ref 0–35)
AST: 23 U/L (ref 0–37)
BUN: 19 mg/dL (ref 6–23)
CO2: 28 mEq/L (ref 19–32)
Calcium: 9.5 mg/dL (ref 8.4–10.5)
Chloride: 105 mEq/L (ref 96–112)
Creat: 1.12 mg/dL — ABNORMAL HIGH (ref 0.50–1.10)
GFR, Est African American: 57 mL/min — ABNORMAL LOW
GFR, Est Non African American: 50 mL/min — ABNORMAL LOW
GLUCOSE: 87 mg/dL (ref 70–99)
POTASSIUM: 5.2 meq/L (ref 3.5–5.3)
SODIUM: 140 meq/L (ref 135–145)
TOTAL PROTEIN: 6.8 g/dL (ref 6.0–8.3)
Total Bilirubin: 0.6 mg/dL (ref 0.2–1.2)

## 2014-08-11 NOTE — Progress Notes (Signed)
Pt has been scheduled for CT Scan 8/13 at [facility] at 5:15pm She needs to RTC for a stat CMP prior to her CT. Pt advised. She will return to clinic- order has been placed.

## 2014-08-11 NOTE — Progress Notes (Signed)
[facility] [address] [place] [address] [phone]  Date:  08/11/2014   Name:  Wanda Rivera   DOB:  [birth year]   MRN:  [id number]  PCP:  No primary provider on file.    Chief Complaint: Follow-up   History of Present Illness:  Wanda Rivera is a 73 y.o. very pleasant female patient who presents with the following:  She was seen here on 7/24 with right shoulder blade pain.  She does not have any CP or SOB.   She is really afraid to take most medications because they affect her so strongly; therefore she has not tried any muscle relaxer or other Rx medication for this issue.  She can take tylenol OTC only.   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  She would like to do further imagine to ensure that all is well.  She had a negative CXR on 07/24/2014.   Otherwise she has felt well.  No skin changes S/p bilateral mastectomy   There are no active problems to display for this patient.   Past Medical History  Diagnosis Date  . Arthritis   . Osteoporosis   . Cataract   . Glaucoma   . Cancer     1982, recurrence in 1985    Past Surgical History  Procedure Laterality Date  . Abdominal hysterectomy    . Breast surgery      double masectomy for breast cancer    History  Substance Use Topics  . Smoking status: Never Smoker   . Smokeless tobacco: Not on file  . Alcohol Use: No    Family History  Problem Relation Age of Onset  . Cancer Mother   . Diabetes Father   . Hypertension Father   . Hypertension Sister   . Diabetes Brother   . Heart disease Brother     No Known Allergies  Medication list has been reviewed and updated.  Current Outpatient Prescriptions on File Prior to Visit  Medication Sig Dispense Refill  . acetaminophen (TYLENOL) 500 MG tablet Take 500 mg by mouth as needed.      Marland Kitchen omeprazole (PRILOSEC) 20 MG capsule  Take 20 mg by mouth 2 (two) times daily.       No current facility-administered medications on file prior to visit.    Review of Systems:  As per HPI- otherwise negative.   Physical Examination: Filed Vitals:   08/11/14 1212  BP: 123/72  Pulse: 49  Temp: 98.6 F (37 C)  Resp: 18   Filed Vitals:   08/11/14 1212  Weight: 131 lb (59.421 kg)   Body mass index is 21.8 kg/(m^2). Ideal Body Weight:    GEN: WDWN, NAD, Non-toxic, A & O x 3, slim build HEENT: Atraumatic, Normocephalic. Neck supple. No masses, No LAD. Ears and Nose: No external deformity. CV: RRR, No M/G/R. No JVD. No thrill. No extra heart sounds.  Chest wall is s/p bilateral mastectomy PULM: CTA B, no wheezes, crackles, rhonchi. No retractions. No resp. distress. No accessory muscle use. EXTR: No c/c/e NEURO Normal gait.  PSYCH: Normally interactive. Conversant. Not depressed or anxious appearing.  Calm demeanor.  She is tender along the medial border of the right scapula.  No swelling, redness, heat or bruising.  No pain with manipulation of the right shoulder. Shoulder with full ROM.    Assessment and  Plan: Right shoulder pain - Plan: CT Chest W Contrast, COMPLETE METABOLIC PANEL WITH GFR  History of breast cancer - Plan: CT Chest W Contrast  Persistent right shoulder pain with history of bilateral breast cancer.  Mahala reasonably would like to make sure there is no evidence of metastatic disease.  Will schedule a CT scan for tomorrow when her son is able to drive her to her appt.   Follow-up pending CT results   Signed Lamar Blinks, MD

## 2014-08-11 NOTE — Patient Instructions (Signed)
We will set up your CT scan for tomorrow and will give you a call with these details.

## 2014-08-12 ENCOUNTER — Ambulatory Visit (HOSPITAL_COMMUNITY)
Admission: RE | Admit: 2014-08-12 | Discharge: 2014-08-12 | Disposition: A | Discharge status: Home / Self Care | Payer: Medicare Other | Source: Ambulatory Visit | Attending: Family Medicine | Admitting: Family Medicine

## 2014-08-12 DIAGNOSIS — Z9010 Acquired absence of unspecified breast and nipple: Secondary | ICD-10-CM | POA: Diagnosis not present

## 2014-08-12 DIAGNOSIS — M25519 Pain in unspecified shoulder: Secondary | ICD-10-CM | POA: Insufficient documentation

## 2014-08-12 DIAGNOSIS — M25511 Pain in right shoulder: Secondary | ICD-10-CM

## 2014-08-12 DIAGNOSIS — Z853 Personal history of malignant neoplasm of breast: Secondary | ICD-10-CM

## 2014-08-12 DIAGNOSIS — J9819 Other pulmonary collapse: Secondary | ICD-10-CM | POA: Diagnosis not present

## 2014-08-12 MED ORDER — IOHEXOL 300 MG/ML  SOLN
80.0000 mL | Freq: Once | INTRAMUSCULAR | Status: AC | PRN
  Administered 2014-08-12: 80 mL via INTRAVENOUS

## 2014-08-14 ENCOUNTER — Telehealth: Payer: Self-pay | Admitting: Family Medicine

## 2014-08-14 ENCOUNTER — Encounter: Payer: Self-pay | Admitting: Family Medicine

## 2014-08-14 NOTE — Telephone Encounter (Signed)
Called and [facility]- CT looks fine.  I will send her a copy, let me know if sx persist

## 2014-08-16 ENCOUNTER — Telehealth: Payer: Self-pay

## 2014-08-16 DIAGNOSIS — H4011X0 Primary open-angle glaucoma, stage unspecified: Secondary | ICD-10-CM | POA: Diagnosis not present

## 2014-08-16 DIAGNOSIS — H2510 Age-related nuclear cataract, unspecified eye: Secondary | ICD-10-CM | POA: Diagnosis not present

## 2014-08-16 DIAGNOSIS — H409 Unspecified glaucoma: Secondary | ICD-10-CM | POA: Diagnosis not present

## 2014-08-16 NOTE — Telephone Encounter (Signed)
Pt calling in regards to mlom by Dr. Lorelei Pont, would like her to know that she is very happy about the report. Pt would like if the results could be mailed the results to her at  [address]

## 2014-08-17 NOTE — Telephone Encounter (Signed)
Per Dr. Serita Grit' note- copy has been mailed to the patient.

## 2014-09-01 ENCOUNTER — Encounter: Payer: Self-pay | Admitting: Family Medicine

## 2014-09-01 ENCOUNTER — Ambulatory Visit (INDEPENDENT_AMBULATORY_CARE_PROVIDER_SITE_OTHER): Payer: Medicare Other | Admitting: Family Medicine

## 2014-09-01 ENCOUNTER — Ambulatory Visit (INDEPENDENT_AMBULATORY_CARE_PROVIDER_SITE_OTHER): Payer: Medicare Other

## 2014-09-01 VITALS — BP 122/64 | HR 60 | Temp 97.7°F | Resp 16 | Ht 64.5 in | Wt 133.6 lb

## 2014-09-01 DIAGNOSIS — M25519 Pain in unspecified shoulder: Secondary | ICD-10-CM | POA: Diagnosis not present

## 2014-09-01 DIAGNOSIS — M47812 Spondylosis without myelopathy or radiculopathy, cervical region: Secondary | ICD-10-CM | POA: Diagnosis not present

## 2014-09-01 DIAGNOSIS — M25511 Pain in right shoulder: Secondary | ICD-10-CM

## 2014-09-01 MED ORDER — PREDNISONE 20 MG PO TABS: ORAL_TABLET | ORAL | Status: DC

## 2014-09-01 NOTE — Patient Instructions (Signed)
We will get you set up to see a spine doctor to see what else may be available to help you.  In the meantime use the steroid as directed (prednisone).   Let me know if you are getting worse or if you have any other concerns.

## 2014-09-01 NOTE — Progress Notes (Signed)
[facility] [address] [place] [address] [phone]  Date:  09/01/2014   Name:  Wanda Rivera   DOB:  [birth year]   MRN:  [id number]  PCP:  No PCP Per Patient    Chief Complaint: Follow-up   History of Present Illness:  Wanda Rivera is a 73 y.o. very pleasant female patient who presents with the following:  Here today to follow-up on persistent right shoulder blade pain.  She was originally seen with this on 7/24- she was concerned due to history of breast cancer and wondered if this could indicate a recurrence. She then came back on 8/12 with the same and we did a CT which was negative  She is here today because she still has this pain.  She is not sure what to do next.   She is able to use her shoulder ok, but if she flexes her neck for a significant period of time she has pain in the right trapezius and may feel a pulling sensation across her chest.  She does not notice any pain in her neck.  However she does have a soft cervical collar that she has used- this has seemed to help her some. She has been told that she had degenerative changes in her neck in the past   There are no active problems to display for this patient.   Past Medical History  Diagnosis Date  . Arthritis   . Osteoporosis   . Cataract   . Glaucoma   . Cancer     1982, recurrence in 1985    Past Surgical History  Procedure Laterality Date  . Abdominal hysterectomy    . Breast surgery      double masectomy for breast cancer    History  Substance Use Topics  . Smoking status: Never Smoker   . Smokeless tobacco: Not on file  . Alcohol Use: No    Family History  Problem Relation Age of Onset  . Cancer Mother   . Diabetes Father   . Hypertension Father   . Hypertension Sister   . Diabetes Brother   . Heart disease Brother     No Known Allergies  Medication list has been reviewed and updated.  Current Outpatient Prescriptions on File Prior to Visit  Medication  Sig Dispense Refill  . acetaminophen (TYLENOL) 500 MG tablet Take 500 mg by mouth as needed.      . meclizine (ANTIVERT) 12.5 MG tablet Take 25 mg by mouth 3 (three) times daily as needed for dizziness.       Marland Kitchen omeprazole (PRILOSEC) 20 MG capsule Take 20 mg by mouth 2 (two) times daily.       No current facility-administered medications on file prior to visit.    Review of Systems:  As per HPI- otherwise negative.   Physical Examination: Filed Vitals:   09/01/14 1024  BP: 122/64  Pulse: 48  Temp: 97.7 F (36.5 C)  Resp: 16   Filed Vitals:   09/01/14 1024  Height: 5' 4.5" (1.638 m)  Weight: 133 lb 9.6 oz (60.601 kg)   Body mass index is 22.59 kg/(m^2). Ideal Body Weight: Weight in (lb) to have BMI = 25: 147.6  GEN: WDWN, NAD, Non-toxic, A & O x 3, slim build, looks well HEENT: Atraumatic, Normocephalic. Neck supple. No masses, No LAD. Ears and Nose: No external deformity. CV: RRR, No M/G/R. No JVD. No thrill. No extra heart sounds. PULM: CTA B, no wheezes,  crackles, rhonchi. No retractions. No resp. distress. No accessory muscle use. EXTR: No c/c/e NEURO Normal gait.  PSYCH: Normally interactive. Conversant. Not depressed or anxious appearing.  Calm demeanor.  She has tightness and soreness in the right trapezius muscle.  Normal ROM of her neck, normal BUE strength, sensation and DTR.   She does not have any pain or tenderness in her chest at this time.  Right shoulder with normal ROM, no pain  [facility] reading (PRIMARY) by  Dr. Lorelei Pont. Cervical spine: significant degenerative change and apparent fusion at C4-5  CERVICAL SPINE 4+ VIEWS  COMPARISON: None  FINDINGS: Diffuse osseous demineralization. Prevertebral soft tissues normal thickness. 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 Multilevel disc space narrowing and endplate spur formation. Mild retrolisthesis at C6-C7. Vertebral body heights maintained without fracture or additional subluxation. Scattered facet  degenerative changes. Bony foramina grossly patent. C1-C2 alignment grossly normal for slight head rotation. Lung apices appear hyperaerated.  IMPRESSION: Scattered degenerative disc and facet disease changes as above. 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 No acute abnormalities.  Assessment and Plan: Cervical spine arthritis - Plan: predniSONE (DELTASONE) 20 MG tablet, AMB referral to orthopedics  Right shoulder pain - Plan: DG Cervical Spine Complete, predniSONE (DELTASONE) 20 MG tablet, AMB referral to orthopedics  Suspect that her pain may be due to her neck- will try a short course of steroids and have her see ortho or NSG for her neck.  She has a neck collar that she can use as needed.  She will let me know if not better- Sooner if worse.   She has a copy of her x-rays to take with her Had her do a few squats- pulse to 60 BPM Signed Lamar Blinks, MD

## 2014-09-03 DIAGNOSIS — H4011X0 Primary open-angle glaucoma, stage unspecified: Secondary | ICD-10-CM | POA: Diagnosis not present

## 2014-09-07 DIAGNOSIS — M47812 Spondylosis without myelopathy or radiculopathy, cervical region: Secondary | ICD-10-CM | POA: Diagnosis not present

## 2014-09-17 DIAGNOSIS — Z23 Encounter for immunization: Secondary | ICD-10-CM | POA: Diagnosis not present

## 2014-10-15 DIAGNOSIS — H4011X3 Primary open-angle glaucoma, severe stage: Secondary | ICD-10-CM | POA: Diagnosis not present

## 2014-10-15 DIAGNOSIS — H40011 Open angle with borderline findings, low risk, right eye: Secondary | ICD-10-CM | POA: Diagnosis not present

## 2014-11-08 DIAGNOSIS — H25812 Combined forms of age-related cataract, left eye: Secondary | ICD-10-CM | POA: Diagnosis not present

## 2014-11-08 DIAGNOSIS — H269 Unspecified cataract: Secondary | ICD-10-CM | POA: Diagnosis not present

## 2014-11-08 DIAGNOSIS — H40159 Residual stage of open-angle glaucoma, unspecified eye: Secondary | ICD-10-CM | POA: Diagnosis not present

## 2014-11-08 DIAGNOSIS — H4011X3 Primary open-angle glaucoma, severe stage: Secondary | ICD-10-CM | POA: Diagnosis not present

## 2014-11-08 DIAGNOSIS — H2512 Age-related nuclear cataract, left eye: Secondary | ICD-10-CM | POA: Diagnosis not present

## 2014-11-08 DIAGNOSIS — H2589 Other age-related cataract: Secondary | ICD-10-CM | POA: Diagnosis not present

## 2014-12-07 ENCOUNTER — Ambulatory Visit (INDEPENDENT_AMBULATORY_CARE_PROVIDER_SITE_OTHER): Payer: Medicare Other | Admitting: Internal Medicine

## 2014-12-07 ENCOUNTER — Encounter: Payer: Self-pay | Admitting: Internal Medicine

## 2014-12-07 VITALS — BP 112/72 | HR 56 | Temp 98.1°F | Resp 14 | Ht 64.0 in | Wt 135.1 lb

## 2014-12-07 DIAGNOSIS — H409 Unspecified glaucoma: Secondary | ICD-10-CM

## 2014-12-07 DIAGNOSIS — M8580 Other specified disorders of bone density and structure, unspecified site: Secondary | ICD-10-CM | POA: Diagnosis not present

## 2014-12-07 DIAGNOSIS — Z0000 Encounter for general adult medical examination without abnormal findings: Secondary | ICD-10-CM | POA: Diagnosis not present

## 2014-12-07 NOTE — Patient Instructions (Signed)
We will have you go down to the basement and stop it medical records so that we can request your records from [place].  We will not do any blood work today and we will see you back next year for your physical. If you have any new problems or questions before that visit please feel free to call our office.  Exercise to Stay Healthy Exercise helps you become and stay healthy. EXERCISE IDEAS AND TIPS Choose exercises that:  You enjoy.  Fit into your day. You do not need to exercise really hard to be healthy. You can do exercises at a slow or medium level and stay healthy. You can:  Stretch before and after working out.  Try yoga, Pilates, or tai chi.  Lift weights.  Walk fast, swim, jog, run, climb stairs, bicycle, dance, or rollerskate.  Take aerobic classes. Exercises that burn about 150 calories:  Running 1  miles in 15 minutes.  Playing volleyball for 45 to 60 minutes.  Washing and waxing a car for 45 to 60 minutes.  Playing touch football for 45 minutes.  Walking 1  miles in 35 minutes.  Pushing a stroller 1  miles in 30 minutes.  Playing basketball for 30 minutes.  Raking leaves for 30 minutes.  Bicycling 5 miles in 30 minutes.  Walking 2 miles in 30 minutes.  Dancing for 30 minutes.  Shoveling snow for 15 minutes.  Swimming laps for 20 minutes.  Walking up stairs for 15 minutes.  Bicycling 4 miles in 15 minutes.  Gardening for 30 to 45 minutes.  Jumping rope for 15 minutes.  Washing windows or floors for 45 to 60 minutes. Document Released: 01/19/2011 Document Revised: 03/10/2012 Document Reviewed: 01/19/2011 [facility] Patient Information 2015 [place], [place]. This information is not intended to replace advice given to you by your health care provider. Make sure you discuss any questions you have with your health care provider.

## 2014-12-07 NOTE — Progress Notes (Signed)
Pre visit review using our clinic review tool, if applicable. No additional management support is needed unless otherwise documented below in the visit note. 

## 2014-12-09 DIAGNOSIS — H409 Unspecified glaucoma: Secondary | ICD-10-CM | POA: Insufficient documentation

## 2014-12-09 DIAGNOSIS — Z0000 Encounter for general adult medical examination without abnormal findings: Secondary | ICD-10-CM | POA: Insufficient documentation

## 2014-12-09 DIAGNOSIS — M810 Age-related osteoporosis without current pathological fracture: Secondary | ICD-10-CM | POA: Insufficient documentation

## 2014-12-09 DIAGNOSIS — M8580 Other specified disorders of bone density and structure, unspecified site: Secondary | ICD-10-CM | POA: Insufficient documentation

## 2014-12-09 NOTE — Assessment & Plan Note (Signed)
Patient continues to follow with her eye doctor and is stable at this time.

## 2014-12-09 NOTE — Progress Notes (Signed)
   Subjective:    Patient ID: Wanda Rivera, female    DOB: [birth year], 73 y.o.   MRN: [id number]  HPI The patient is a 73 year old female comes in today to establish care. She did move from [place] some years ago and is wanting to establish primary care. She has had some problems off and on over the years with dizziness however has not had those issues in some time. She also has been diagnosed with osteopenia in the past and does take calcium and vitamin D and do weightbearing exercise. She also has glaucoma and continues to follow with an eye doctor regularly. Otherwise she denies chest pain, shortness breath, abdominal pain. She denies any balance problems or falls. She denies any major osteoarthritis. She has been going back and forth from [place] several times a year and has been doing her annual physicals there however her doctor in [place] is retiring and advised she get a doctor in [place].  Review of Systems  Constitutional: Negative for fever, activity change, appetite change, fatigue and unexpected weight change.  HENT: Negative.   Respiratory: Negative for cough, chest tightness, shortness of breath and wheezing.   Cardiovascular: Negative for chest pain, palpitations and leg swelling.  Gastrointestinal: Negative for nausea, abdominal pain, diarrhea, constipation and abdominal distention.  Musculoskeletal: Negative.   Neurological: Negative.       Objective:   Physical Exam  Constitutional: She is oriented to person, place, and time. She appears well-developed and well-nourished.  HENT:  Head: Normocephalic and atraumatic.  Eyes: EOM are normal.  Neck: Normal range of motion.  Cardiovascular: Normal rate and regular rhythm.   Pulmonary/Chest: Effort normal and breath sounds normal. No respiratory distress. She has no wheezes. She has no rales.  Abdominal: Soft. Bowel sounds are normal. She exhibits no distension. There is no tenderness. There is no rebound.  Neurological: She  is alert and oriented to person, place, and time. Coordination normal.  Skin: Skin is warm and dry.   Filed Vitals:   12/07/14 1103  BP: 112/72  Pulse: 56  Temp: 98.1 F (36.7 C)  TempSrc: Oral  Resp: 14  Height: 5\' 4"  (1.626 m)  Weight: 135 lb 1.9 oz (61.29 kg)  SpO2: 97%      Assessment & Plan:

## 2014-12-09 NOTE — Assessment & Plan Note (Signed)
Patient was reminded to schedule mammogram. She states she likely has had a tetanus shot in the pneumonia shot within the last 10 years. We will wait on her records from [place]. Talked to her about exercise and diet. She states she just had lab work done so will obtain those records as well.

## 2014-12-09 NOTE — Assessment & Plan Note (Signed)
Will obtain her records from [place]. It is likely been several years since her last DEXA scan however we'll wait for results then schedule DEXA scan if needed. She continues with calcium and vitamin D and weightbearing exercise.

## 2014-12-10 ENCOUNTER — Telehealth: Payer: Self-pay | Admitting: Internal Medicine

## 2014-12-10 NOTE — Telephone Encounter (Signed)
Rec'd from [facility] forward 11 pages to Dr. Doug Sou

## 2014-12-14 ENCOUNTER — Other Ambulatory Visit: Payer: Self-pay | Admitting: Geriatric Medicine

## 2014-12-14 MED ORDER — OMEPRAZOLE 20 MG PO CPDR: 20.0000 mg | DELAYED_RELEASE_CAPSULE | Freq: Two times a day (BID) | ORAL | Status: DC

## 2014-12-29 DIAGNOSIS — H25811 Combined forms of age-related cataract, right eye: Secondary | ICD-10-CM | POA: Diagnosis not present

## 2014-12-29 DIAGNOSIS — H2589 Other age-related cataract: Secondary | ICD-10-CM | POA: Diagnosis not present

## 2014-12-29 DIAGNOSIS — H40159 Residual stage of open-angle glaucoma, unspecified eye: Secondary | ICD-10-CM | POA: Diagnosis not present

## 2014-12-29 DIAGNOSIS — H2511 Age-related nuclear cataract, right eye: Secondary | ICD-10-CM | POA: Diagnosis not present

## 2014-12-29 DIAGNOSIS — H4011X3 Primary open-angle glaucoma, severe stage: Secondary | ICD-10-CM | POA: Diagnosis not present

## 2015-01-02 ENCOUNTER — Ambulatory Visit (INDEPENDENT_AMBULATORY_CARE_PROVIDER_SITE_OTHER): Payer: Medicare Other | Admitting: Family Medicine

## 2015-01-02 ENCOUNTER — Ambulatory Visit (INDEPENDENT_AMBULATORY_CARE_PROVIDER_SITE_OTHER): Payer: Medicare Other

## 2015-01-02 VITALS — BP 135/75 | HR 49 | Temp 98.1°F | Resp 16 | Ht 65.5 in | Wt 135.0 lb

## 2015-01-02 DIAGNOSIS — M25512 Pain in left shoulder: Secondary | ICD-10-CM | POA: Diagnosis not present

## 2015-01-02 DIAGNOSIS — M25432 Effusion, left wrist: Secondary | ICD-10-CM

## 2015-01-02 DIAGNOSIS — W19XXXA Unspecified fall, initial encounter: Secondary | ICD-10-CM

## 2015-01-02 NOTE — Patient Instructions (Signed)

## 2015-01-02 NOTE — Progress Notes (Signed)
74 yo woman who fell when exiting back door and landed on cement injuring her left side:  Shoulder, elbow, wrist, ribs and hip  No head injury, no loss of consciousness  She has also been having some abdominal problems.  Last colonoscopy 07/2010. She has undergone cardiac workup in past (Dr. Einar Gip) for vertigo with head scan  Moved here from [facility] [place]  Objective:  NAD Left dorsal proximal radial wrist is swollen Abrasion left patellar area, left olecranon  [facility] reading (PRIMARY) by  Dr. Joseph Art:  Left shoulder, left wrist.   mastectomy clips present. No fracture seen  This chart was scribed in my presence and reviewed by me personally.    ICD-9-CM ICD-10-CM   1. Fall, initial encounter 8010916509 W19.XXXA DG Shoulder Left     DG Wrist Complete Left  2. Wrist swelling, left 719.03 M25.432 DG Wrist Complete Left  3. Pain in joint, shoulder region, left 719.41 M25.512 DG Shoulder Left     Signed, Robyn Haber, MD

## 2015-01-07 ENCOUNTER — Ambulatory Visit (INDEPENDENT_AMBULATORY_CARE_PROVIDER_SITE_OTHER): Payer: Medicare Other | Admitting: Internal Medicine

## 2015-01-07 ENCOUNTER — Encounter: Payer: Self-pay | Admitting: Internal Medicine

## 2015-01-07 VITALS — BP 130/68 | HR 60 | Temp 98.3°F | Resp 20 | Wt 134.2 lb

## 2015-01-07 DIAGNOSIS — R1011 Right upper quadrant pain: Secondary | ICD-10-CM | POA: Diagnosis not present

## 2015-01-07 NOTE — Progress Notes (Signed)
Pre visit review using our clinic review tool, if applicable. No additional management support is needed unless otherwise documented below in the visit note. 

## 2015-01-07 NOTE — Patient Instructions (Signed)
We are going to get an ultrasound of the stomach which will let us see the organs and make sure there are no problems. You will get a phone call about scheduling it and where to go. It will be at [facility]

## 2015-01-07 NOTE — Assessment & Plan Note (Signed)
Feel likely that this is a superficial muscular pain but given her strong concern of cancer in the liver will check US abdomen and also rule out gall bladder problems.

## 2015-01-07 NOTE — Progress Notes (Signed)
   Subjective:    Patient ID: Wanda Rivera, female    DOB: February 11, 1941, 74 y.o.   MRN: [id number]  HPI The patient is a 74 YO female who is coming in today for RUQ pain. She had a fall recently and is in a wrist splint for now (no fracture) but this pain has been going on for several weeks. If feels like a pinching in her stomach below the ribs on the right. She is concerned that it could be cancer even though her breast cancer was 30 years ago. Her mother died of breast cancer that spread to the liver. She denies nausea, vomiting, diarrhea, constipation. She has been using heating pad on it and this seems to help some but it keeps coming back. She denies recent cough or other strain on her muscles.   Review of Systems  Constitutional: Positive for activity change. Negative for fever, appetite change, fatigue and unexpected weight change.  HENT: Negative.   Respiratory: Negative for cough, chest tightness, shortness of breath and wheezing.   Cardiovascular: Negative for chest pain, palpitations and leg swelling.  Gastrointestinal: Positive for abdominal pain. Negative for nausea, diarrhea, constipation, blood in stool and abdominal distention.  Musculoskeletal: Positive for myalgias and arthralgias. Negative for gait problem.  Neurological: Negative for dizziness, weakness, light-headedness and numbness.  Psychiatric/Behavioral: Negative.       Objective:   Physical Exam  Constitutional: She is oriented to person, place, and time. She appears well-developed and well-nourished.  HENT:  Head: Normocephalic and atraumatic.  Eyes: EOM are normal.  Neck: Normal range of motion.  Cardiovascular: Normal rate and regular rhythm.   Pulmonary/Chest: Effort normal and breath sounds normal. No respiratory distress. She has no wheezes. She has no rales.  Abdominal: Soft. Bowel sounds are normal. She exhibits no distension. There is tenderness. There is no rebound.  Mild tenderness to palpation RUQ    Musculoskeletal:  Left wrist in splint, removed and mild swelling around the wrist and hematoma on the forearm.   Neurological: She is alert and oriented to person, place, and time. Coordination normal.  Skin: Skin is warm and dry.   Filed Vitals:   01/07/15 1038  BP: 130/68  Pulse: 60  Temp: 98.3 F (36.8 C)  TempSrc: Oral  Resp: 20  Weight: 134 lb 3.2 oz (60.873 kg)  SpO2: 98%      Assessment & Plan:

## 2015-01-13 ENCOUNTER — Ambulatory Visit
Admission: RE | Admit: 2015-01-13 | Discharge: 2015-01-13 | Disposition: A | Discharge status: Home / Self Care | Payer: Medicare Other | Source: Ambulatory Visit | Attending: Internal Medicine | Admitting: Internal Medicine

## 2015-01-13 DIAGNOSIS — R1011 Right upper quadrant pain: Secondary | ICD-10-CM

## 2015-02-22 ENCOUNTER — Ambulatory Visit (INDEPENDENT_AMBULATORY_CARE_PROVIDER_SITE_OTHER): Payer: Medicare Other

## 2015-02-22 ENCOUNTER — Ambulatory Visit (INDEPENDENT_AMBULATORY_CARE_PROVIDER_SITE_OTHER): Payer: Medicare Other | Admitting: Emergency Medicine

## 2015-02-22 VITALS — BP 118/64 | HR 52 | Temp 98.4°F | Resp 18

## 2015-02-22 DIAGNOSIS — R5381 Other malaise: Secondary | ICD-10-CM | POA: Diagnosis not present

## 2015-02-22 DIAGNOSIS — R531 Weakness: Secondary | ICD-10-CM

## 2015-02-22 DIAGNOSIS — R001 Bradycardia, unspecified: Secondary | ICD-10-CM | POA: Diagnosis not present

## 2015-02-22 DIAGNOSIS — R5383 Other fatigue: Secondary | ICD-10-CM | POA: Diagnosis not present

## 2015-02-22 LAB — POCT CBC
Granulocyte percent: 64.4 %G (ref 37–80)
HCT, POC: 42.3 % (ref 37.7–47.9)
HEMOGLOBIN: 13.5 g/dL (ref 12.2–16.2)
Lymph, poc: 1.9 (ref 0.6–3.4)
MCH, POC: 30.4 pg (ref 27–31.2)
MCHC: 32 g/dL (ref 31.8–35.4)
MCV: 95.2 fL (ref 80–97)
MID (cbc): 0.2 (ref 0–0.9)
MPV: 8.1 fL (ref 0–99.8)
POC Granulocyte: 3.7 (ref 2–6.9)
POC LYMPH %: 32.9 % (ref 10–50)
POC MID %: 2.7 %M (ref 0–12)
Platelet Count, POC: 208 10*3/uL (ref 142–424)
RBC: 4.45 M/uL (ref 4.04–5.48)
RDW, POC: 13.6 %
WBC: 5.8 10*3/uL (ref 4.6–10.2)

## 2015-02-22 LAB — COMPREHENSIVE METABOLIC PANEL
ALT: 10 U/L (ref 0–35)
AST: 17 U/L (ref 0–37)
Albumin: 4 g/dL (ref 3.5–5.2)
Alkaline Phosphatase: 81 U/L (ref 39–117)
BILIRUBIN TOTAL: 0.5 mg/dL (ref 0.2–1.2)
BUN: 20 mg/dL (ref 6–23)
CO2: 25 meq/L (ref 19–32)
CREATININE: 1.31 mg/dL — AB (ref 0.50–1.10)
Calcium: 9.3 mg/dL (ref 8.4–10.5)
Chloride: 108 mEq/L (ref 96–112)
Glucose, Bld: 79 mg/dL (ref 70–99)
Potassium: 4.7 mEq/L (ref 3.5–5.3)
Sodium: 139 mEq/L (ref 135–145)
TOTAL PROTEIN: 6.6 g/dL (ref 6.0–8.3)

## 2015-02-22 LAB — POCT URINALYSIS DIPSTICK
BILIRUBIN UA: NEGATIVE
Glucose, UA: NEGATIVE
Ketones, UA: NEGATIVE
LEUKOCYTES UA: NEGATIVE
NITRITE UA: NEGATIVE
Protein, UA: NEGATIVE
RBC UA: NEGATIVE
Spec Grav, UA: 1.01
Urobilinogen, UA: 0.2
pH, UA: 7

## 2015-02-22 LAB — POCT UA - MICROSCOPIC ONLY
BACTERIA, U MICROSCOPIC: NEGATIVE
CASTS, UR, LPF, POC: NEGATIVE
CRYSTALS, UR, HPF, POC: NEGATIVE
EPITHELIAL CELLS, URINE PER MICROSCOPY: NEGATIVE
Mucus, UA: NEGATIVE
RBC, urine, microscopic: NEGATIVE
WBC, Ur, HPF, POC: NEGATIVE
Yeast, UA: NEGATIVE

## 2015-02-22 NOTE — Patient Instructions (Signed)
Bradycardia °Bradycardia is a term for a heart rate (pulse) that, in adults, is slower than 60 beats per minute. A normal rate is 60 to 100 beats per minute. A heart rate below 60 beats per minute may be normal for some adults with healthy hearts. If the rate is too slow, the heart may have trouble pumping the volume of blood the body needs. If the heart rate gets too low, blood flow to the brain may be decreased and may make you feel lightheaded, dizzy, or faint. °The heart has a natural pacemaker in the top of the heart called the SA node (sinoatrial or sinus node). This pacemaker sends out regular electrical signals to the muscle of the heart, telling the heart muscle when to beat (contract). The electrical signal travels from the upper parts of the heart (atria) through the AV node (atrioventricular node), to the lower chambers of the heart (ventricles). The ventricles squeeze, pumping the blood from your heart to your lungs and to the rest of your body. °CAUSES  °· Problem with the heart's electrical system. °· Problem with the heart's natural pacemaker. °· Heart disease, damage, or infection. °· Medications. °· Problems with minerals and salts (electrolytes). °SYMPTOMS  °· Fainting (syncope). °· Fatigue and weakness. °· Shortness of breath (dyspnea). °· Chest pain (angina). °· Drowsiness. °· Confusion. °DIAGNOSIS  °· An electrocardiogram (ECG) can help your caregiver determine the type of slow heart rate you have. °· If the cause is not seen on an ECG, you may need to wear a heart monitor that records your heart rhythm for several hours or days. °· Blood tests. °TREATMENT  °· Electrolyte supplements. °· Medications. °· Withholding medication which is causing a slow heart rate. °· Pacemaker placement. °SEEK IMMEDIATE MEDICAL CARE IF:  °· You feel lightheaded or faint. °· You develop an irregular heart rate. °· You feel chest pain or have trouble breathing. °MAKE SURE YOU:  °· Understand these  instructions. °· Will watch your condition. °· Will get help right away if you are not doing well or get worse. °Document Released: 09/08/2002 Document Revised: 03/10/2012 Document Reviewed: 03/24/2014 °[employer]® Patient Information ©2015 [employer]. This information is not intended to replace advice given to you by your health care provider. Make sure you discuss any questions you have with your health care provider. ° °

## 2015-02-22 NOTE — Progress Notes (Signed)
[facility] [address] [place] [address] [phone]  Date:  02/22/2015   Name:  Wanda Rivera   DOB:  [birth year]   MRN:  [id number]  PCP:  Olga Millers, MD    Chief Complaint: Fatigue and Chills   History of Present Illness:  Wanda Rivera is a 74 y.o. very pleasant female patient who presents with the following:  Says she has had an upper respiratory infection 10 days ago that she says has resolved. She comes to the office with a complaint of weakness and fatigue Denies cough, shortness of breath.  No wheezing. No coryza, fever or chills No nausea or vomiting.  No stool change No rash No peripheral edema No chest pain or tightness, no peripheral edema Denies other complaint or health concern today.   Patient Active Problem List   Diagnosis Date Noted  . RUQ pain 01/07/2015  . Routine general medical examination at a health care facility 12/09/2014  . Osteopenia 12/09/2014  . Glaucoma 12/09/2014    Past Medical History  Diagnosis Date  . Arthritis   . Osteoporosis   . Cataract   . Glaucoma   . Cancer     1982, recurrence in 1985    Past Surgical History  Procedure Laterality Date  . Abdominal hysterectomy    . Breast surgery      double masectomy for breast cancer    History  Substance Use Topics  . Smoking status: Never Smoker   . Smokeless tobacco: Not on file  . Alcohol Use: No    Family History  Problem Relation Age of Onset  . Cancer Mother   . Diabetes Father   . Hypertension Father   . Hypertension Sister   . Diabetes Brother   . Heart disease Brother     No Known Allergies  Medication list has been reviewed and updated.  Current Outpatient Prescriptions on File Prior to Visit  Medication Sig Dispense Refill  . acetaminophen (TYLENOL) 500 MG tablet Take 500 mg by mouth as needed.    . Calcium Carb-Cholecalciferol (CALCIUM + D3) 600-200 MG-UNIT TABS Take by mouth 2 (two) times daily.    .  Cholecalciferol (VITAMIN D3) 400 UNITS CAPS Take by mouth.    . dorzolamide-timolol (COSOPT) 22.3-6.8 MG/ML ophthalmic solution Place 1 drop into both eyes 2 (two) times daily.     . meclizine (ANTIVERT) 12.5 MG tablet Take 25 mg by mouth 3 (three) times daily as needed for dizziness.     . Omega-3 Fat Ac-Cholecalciferol (OMEGA ESSENTIALS/VIT D3) LIQD Take by mouth daily.    Marland Kitchen omeprazole (PRILOSEC) 20 MG capsule Take 1 capsule (20 mg total) by mouth 2 (two) times daily. 180 capsule 3   No current facility-administered medications on file prior to visit.    Review of Systems:  As per HPI, otherwise negative.    Physical Examination: Filed Vitals:   02/22/15 1138  BP: 118/64  Pulse: 52  Temp: 98.4 F (36.9 C)  Resp: 18   There were no vitals filed for this visit. There is no weight on file to calculate BMI. Ideal Body Weight:    GEN: WDWN, NAD, Non-toxic, A & O x 3 HEENT: Atraumatic, Normocephalic. Neck supple. No masses, No LAD. Ears and Nose: No external deformity. CV: RRR, No M/G/R. No JVD. No thrill. No extra heart sounds. PULM: CTA B, no wheezes, crackles, rhonchi. No retractions. No resp. distress. No accessory muscle use. ABD: S, NT, ND, +  BS. No rebound. No HSM. EXTR: No c/c/e NEURO Normal gait.  PSYCH: Normally interactive. Conversant. Not depressed or anxious appearing.  Calm demeanor.    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 Ouida Sills.  negative.   Results for orders placed or performed in visit on 02/22/15  POCT CBC  Result Value Ref Range   WBC 5.8 4.6 - 10.2 K/uL   Lymph, poc 1.9 0.6 - 3.4   POC LYMPH PERCENT 32.9 10 - 50 %L   MID (cbc) 0.2 0 - 0.9   POC MID % 2.7 0 - 12 %M   POC Granulocyte 3.7 2 - 6.9   Granulocyte percent 64.4 37 - 80 %G   RBC 4.45 4.04 - 5.48 M/uL   Hemoglobin 13.5 12.2  - 16.2 g/dL   HCT, POC 42.3 37.7 - 47.9 %   MCV 95.2 80 - 97 fL   MCH, POC 30.4 27 - 31.2 pg   MCHC 32.0 31.8 - 35.4 g/dL   RDW, POC 13.6 %   Platelet Count, POC 208 142 - 424 K/uL   MPV 8.1 0 - 99.8 fL  POCT urinalysis dipstick  Result Value Ref Range   Color, UA yellow    Clarity, UA clear    Glucose, UA neg    Bilirubin, UA neg    Ketones, UA neg    Spec Grav, UA 1.010    Blood, UA neg    pH, UA 7.0    Protein, UA neg    Urobilinogen, UA 0.2    Nitrite, UA neg    Leukocytes, UA Negative   POCT UA - Microscopic Only  Result Value Ref Range   WBC, Ur, HPF, POC neg    RBC, urine, microscopic neg    Bacteria, U Microscopic neg    Mucus, UA neg    Epithelial cells, urine per micros neg    Crystals, Ur, HPF, POC neg    Casts, Ur, LPF, POC neg    Yeast, UA neg

## 2015-03-08 DIAGNOSIS — H4011X2 Primary open-angle glaucoma, moderate stage: Secondary | ICD-10-CM | POA: Diagnosis not present

## 2015-03-08 DIAGNOSIS — H4011X3 Primary open-angle glaucoma, severe stage: Secondary | ICD-10-CM | POA: Diagnosis not present

## 2015-03-21 DIAGNOSIS — R001 Bradycardia, unspecified: Secondary | ICD-10-CM | POA: Diagnosis not present

## 2015-06-09 DIAGNOSIS — Z808 Family history of malignant neoplasm of other organs or systems: Secondary | ICD-10-CM | POA: Diagnosis not present

## 2015-06-10 ENCOUNTER — Telehealth: Payer: Self-pay | Admitting: Emergency Medicine

## 2015-06-10 NOTE — Telephone Encounter (Signed)
LVM informing pt we are updating our records for mammograms, call back to update records.

## 2015-06-15 NOTE — Telephone Encounter (Signed)
Patient called back to advise that she had a double mastectomy in 1985. She does not get mammograms any longer.

## 2015-06-23 DIAGNOSIS — H4011X3 Primary open-angle glaucoma, severe stage: Secondary | ICD-10-CM | POA: Diagnosis not present

## 2015-06-23 DIAGNOSIS — H4011X2 Primary open-angle glaucoma, moderate stage: Secondary | ICD-10-CM | POA: Diagnosis not present

## 2015-07-08 ENCOUNTER — Encounter: Payer: Self-pay | Admitting: Internal Medicine

## 2015-07-08 ENCOUNTER — Ambulatory Visit (INDEPENDENT_AMBULATORY_CARE_PROVIDER_SITE_OTHER): Payer: Medicare Other | Admitting: Internal Medicine

## 2015-07-08 ENCOUNTER — Other Ambulatory Visit (INDEPENDENT_AMBULATORY_CARE_PROVIDER_SITE_OTHER): Payer: Medicare Other

## 2015-07-08 ENCOUNTER — Other Ambulatory Visit: Payer: Medicare Other

## 2015-07-08 VITALS — BP 122/78 | HR 51 | Temp 98.4°F | Ht 65.5 in | Wt 132.8 lb

## 2015-07-08 DIAGNOSIS — R1032 Left lower quadrant pain: Secondary | ICD-10-CM

## 2015-07-08 LAB — URINALYSIS, ROUTINE W REFLEX MICROSCOPIC
Bilirubin Urine: NEGATIVE
Hgb urine dipstick: NEGATIVE
KETONES UR: NEGATIVE
Leukocytes, UA: NEGATIVE
Nitrite: NEGATIVE
PH: 6 (ref 5.0–8.0)
RBC / HPF: NONE SEEN (ref 0–?)
SPECIFIC GRAVITY, URINE: 1.01 (ref 1.000–1.030)
Total Protein, Urine: NEGATIVE
URINE GLUCOSE: NEGATIVE
Urobilinogen, UA: 0.2 (ref 0.0–1.0)

## 2015-07-08 LAB — CBC
HEMATOCRIT: 41.2 % (ref 36.0–46.0)
Hemoglobin: 13.8 g/dL (ref 12.0–15.0)
MCHC: 33.5 g/dL (ref 30.0–36.0)
MCV: 93.1 fl (ref 78.0–100.0)
Platelets: 194 10*3/uL (ref 150.0–400.0)
RBC: 4.43 Mil/uL (ref 3.87–5.11)
RDW: 14 % (ref 11.5–15.5)
WBC: 6.7 10*3/uL (ref 4.0–10.5)

## 2015-07-08 LAB — COMPREHENSIVE METABOLIC PANEL
ALK PHOS: 82 U/L (ref 39–117)
ALT: 10 U/L (ref 0–35)
AST: 19 U/L (ref 0–37)
Albumin: 3.9 g/dL (ref 3.5–5.2)
BILIRUBIN TOTAL: 0.4 mg/dL (ref 0.2–1.2)
BUN: 18 mg/dL (ref 6–23)
CO2: 31 meq/L (ref 19–32)
Calcium: 9.5 mg/dL (ref 8.4–10.5)
Chloride: 106 mEq/L (ref 96–112)
Creatinine, Ser: 1.03 mg/dL (ref 0.40–1.20)
GFR: 67.59 mL/min (ref >60.00)
Glucose, Bld: 58 mg/dL — ABNORMAL LOW (ref 70–99)
Potassium: 4.5 mEq/L (ref 3.5–5.1)
Sodium: 142 mEq/L (ref 135–145)
TOTAL PROTEIN: 6.7 g/dL (ref 6.0–8.3)

## 2015-07-08 LAB — LIPASE: Lipase: 56 U/L (ref 11.0–59.0)

## 2015-07-08 NOTE — Assessment & Plan Note (Signed)
Checking CMP, CBC, lipase today. Concern for muscle sprain versus kidney stone. If no abnormalities on exam will get CT abdomen to evaluate. Continue using tylenol for pain as she has had bad reactions with any stronger pain medications and this is the only thing she can take.

## 2015-07-08 NOTE — Progress Notes (Signed)
   Subjective:    Patient ID: Wanda Rivera, female    DOB: [birth year], 74 y.o.   MRN: [id number]  HPI The patient is a 74 YO female coming in for low back pain with LLQ pain for 2 weeks. Denies doing anything extra or injury prior to onset. No change in bowels with either diarrhea or constipation. No pain on urination or increased frequency. No change in medicines. No bending or lifting. She has taken tylenol for the pain with moderate success. Overall worsening over the last 2 weeks. New problem.   Review of Systems  Constitutional: Negative.   Respiratory: Negative.   Cardiovascular: Negative.   Gastrointestinal: Positive for abdominal pain. Negative for nausea, vomiting, diarrhea, constipation and blood in stool.  Musculoskeletal: Positive for myalgias and back pain. Negative for arthralgias and gait problem.  Neurological: Negative.       Objective:   Physical Exam  Constitutional: She is oriented to person, place, and time. She appears well-developed and well-nourished.  HENT:  Head: Normocephalic and atraumatic.  Eyes: EOM are normal.  Neck: Normal range of motion.  Cardiovascular: Normal rate and regular rhythm.   Pulmonary/Chest: Effort normal. No respiratory distress. She has no wheezes. She has no rales.  Abdominal: Soft. She exhibits no distension. There is tenderness. There is no rebound and no guarding.  Tenderness in the left lower quadrant and left flank  Musculoskeletal: She exhibits no edema.  Neurological: She is alert and oriented to person, place, and time.  Skin: Skin is warm and dry.   Filed Vitals:   07/08/15 0942  BP: 122/78  Pulse: 51  Temp: 98.4 F (36.9 C)  TempSrc: Oral  Height: 5' 5.5" (1.664 m)  Weight: 132 lb 12 oz (60.215 kg)  SpO2: 97%      Assessment & Plan:

## 2015-07-08 NOTE — Patient Instructions (Signed)
We will check on the blood and the urine today to find out the cause. If we do not get any good answers we will check a CT of the stomach to see what the problem is.   Probably the two most likely things would be a muscle sprain and kidney stones or infection.

## 2015-07-08 NOTE — Progress Notes (Signed)
Pre visit review using our clinic review tool, if applicable. No additional management support is needed unless otherwise documented below in the visit note. 

## 2015-07-12 ENCOUNTER — Ambulatory Visit (INDEPENDENT_AMBULATORY_CARE_PROVIDER_SITE_OTHER)
Admission: RE | Admit: 2015-07-12 | Discharge: 2015-07-12 | Disposition: A | Discharge status: Home / Self Care | Payer: Medicare Other | Source: Ambulatory Visit | Attending: Internal Medicine | Admitting: Internal Medicine

## 2015-07-12 DIAGNOSIS — R1030 Lower abdominal pain, unspecified: Secondary | ICD-10-CM | POA: Diagnosis not present

## 2015-07-12 DIAGNOSIS — R1032 Left lower quadrant pain: Secondary | ICD-10-CM

## 2015-08-02 ENCOUNTER — Ambulatory Visit: Payer: Medicare Other | Admitting: Internal Medicine

## 2015-08-03 ENCOUNTER — Encounter: Payer: Self-pay | Admitting: *Deleted

## 2015-08-04 ENCOUNTER — Ambulatory Visit (INDEPENDENT_AMBULATORY_CARE_PROVIDER_SITE_OTHER)
Admission: RE | Admit: 2015-08-04 | Discharge: 2015-08-04 | Disposition: A | Discharge status: Home / Self Care | Payer: Medicare Other | Source: Ambulatory Visit | Attending: Internal Medicine | Admitting: Internal Medicine

## 2015-08-04 ENCOUNTER — Ambulatory Visit (INDEPENDENT_AMBULATORY_CARE_PROVIDER_SITE_OTHER): Payer: Medicare Other | Admitting: Internal Medicine

## 2015-08-04 ENCOUNTER — Encounter: Payer: Self-pay | Admitting: Internal Medicine

## 2015-08-04 ENCOUNTER — Ambulatory Visit: Payer: Medicare Other | Admitting: Internal Medicine

## 2015-08-04 VITALS — BP 144/86 | HR 64 | Temp 98.6°F | Resp 16 | Wt 137.0 lb

## 2015-08-04 DIAGNOSIS — M5416 Radiculopathy, lumbar region: Secondary | ICD-10-CM

## 2015-08-04 DIAGNOSIS — M47816 Spondylosis without myelopathy or radiculopathy, lumbar region: Secondary | ICD-10-CM | POA: Diagnosis not present

## 2015-08-04 DIAGNOSIS — I70209 Unspecified atherosclerosis of native arteries of extremities, unspecified extremity: Secondary | ICD-10-CM

## 2015-08-04 DIAGNOSIS — M5137 Other intervertebral disc degeneration, lumbosacral region: Secondary | ICD-10-CM | POA: Diagnosis not present

## 2015-08-04 DIAGNOSIS — R0989 Other specified symptoms and signs involving the circulatory and respiratory systems: Secondary | ICD-10-CM | POA: Diagnosis not present

## 2015-08-04 MED ORDER — TRAMADOL HCL 50 MG PO TABS
50.0000 mg | ORAL_TABLET | Freq: Three times a day (TID) | ORAL | Status: DC | PRN
Start: 2015-08-04 — End: 2015-10-07

## 2015-08-04 MED ORDER — GABAPENTIN 100 MG PO CAPS: ORAL_CAPSULE | ORAL | Status: DC

## 2015-08-04 NOTE — Progress Notes (Signed)
Pre visit review using our clinic review tool, if applicable. No additional management support is needed unless otherwise documented below in the visit note. 

## 2015-08-04 NOTE — Patient Instructions (Signed)
  Your next office appointment will be determined based upon review of your pending xrays  Those written interpretation of the lab results and instructions will be transmitted to you by mail for your records.  Critical results will be called.   Followup as needed for any active or acute issue. Please report any significant change in your symptoms.

## 2015-08-04 NOTE — Progress Notes (Signed)
   Subjective:    Patient ID: Wanda Rivera, female    DOB: [birth year], 74 y.o.   MRN: [id number]  HPI  She describes 6 weeks of discomfort in the left lumbosacral area with initial radiation to the buttock and subsequently to the anterior thigh. There was no trigger or injury for this. She simply awoke 1 day with it. She had fallen in January but did not injure her back. She's been using topical agents like Icy Hot without benefit. The symptoms are worse when she sits. It's constant but can vary from 3-4 up to 9-10 in intensity. She's had some weakness of the left lower extremity and actually limps on that side. 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 She's also seeing an orthopedist  Unrelated is a past history of vertigo.  Review of Systems Fever, chills, sweats, or unexplained weight loss not present. No significant headaches. Mental status change or memory loss denied. Blurred vision , diplopia or vision loss absent. Near syncope or imbalance denied. There is no numbness or tingling in extremities.   No loss of control of bladder or bowels. No seizure stigmata. No rash or change in color or temp of skin in affected area.    Objective:   Physical Exam  Pertinent or positive findings include: She appears much younger than stated age. A left carotid bruit is present.She pushes up from the chair. She is able to walk on her heels and toes but describes discomfort in the left back and upper leg. She is able to lie flat and sit up from the table without help. Negative SLR except for L inguinal discomfort @ 60 degrees. There is slight crepitus of the knees, this is greater in the left.  General appearance :adequately nourished; in no distress.  Eyes: No conjunctival inflammation or scleral icterus is  present.  Oral exam:  Lips and gums are healthy appearing.There is no oropharyngeal erythema or exudate noted. Dental hygiene is good.  Heart:  Normal rate and regular rhythm. S1 and S2 normal without gallop, murmur, click, rub or other extra sounds    Lungs:Chest clear to auscultation; no wheezes, rhonchi,rales ,or rubs present.No increased work of breathing.   Abdomen: bowel sounds normal, soft and non-tender without masses, organomegaly or hernias noted.  No guarding or rebound. No flank tenderness to percussion.  Vascular : all pulses equal ; no bruits present.  Skin:Warm & dry.  Intact without suspicious lesions or rashes ; no tenting   Lymphatic: No lymphadenopathy is noted about the head, neck, axilla, or inguinal areas.   Neuro: Strength, tone & DTRs normal.Cranial nerve exam WNL.       Assessment & Plan:  #1 lumbar radiculopathy #2 remote PMH of breast cancer Plan: imaging Gabapentin qhs trial Consider MRI if symptoms progressive

## 2015-08-06 ENCOUNTER — Other Ambulatory Visit: Payer: Self-pay | Admitting: Internal Medicine

## 2015-08-06 DIAGNOSIS — I70209 Unspecified atherosclerosis of native arteries of extremities, unspecified extremity: Secondary | ICD-10-CM | POA: Insufficient documentation

## 2015-08-12 ENCOUNTER — Other Ambulatory Visit: Payer: Self-pay | Admitting: Internal Medicine

## 2015-08-12 ENCOUNTER — Other Ambulatory Visit: Payer: Medicare Other

## 2015-08-12 DIAGNOSIS — I70209 Unspecified atherosclerosis of native arteries of extremities, unspecified extremity: Secondary | ICD-10-CM

## 2015-08-16 ENCOUNTER — Other Ambulatory Visit: Payer: Self-pay | Admitting: Internal Medicine

## 2015-08-16 ENCOUNTER — Encounter: Payer: Self-pay | Admitting: Internal Medicine

## 2015-08-16 DIAGNOSIS — R0989 Other specified symptoms and signs involving the circulatory and respiratory systems: Secondary | ICD-10-CM | POA: Insufficient documentation

## 2015-08-16 DIAGNOSIS — E785 Hyperlipidemia, unspecified: Secondary | ICD-10-CM | POA: Insufficient documentation

## 2015-08-16 LAB — NMR LIPOPROFILE WITH LIPIDS
Cholesterol, Total: 200 mg/dL — ABNORMAL HIGH (ref 100–199)
HDL PARTICLE NUMBER: 23.8 umol/L — AB (ref >=30.5)
HDL SIZE: 9.1 nm — AB (ref >=9.2)
HDL-C: 52 mg/dL (ref >39)
LARGE HDL: 6.2 umol/L (ref >=4.8)
LDL (calc): 132 mg/dL — ABNORMAL HIGH (ref 0–99)
LDL PARTICLE NUMBER: 1598 nmol/L — AB (ref <1000)
LDL Size: 20.8 nm (ref >=20.8)
LP-IR Score: 25 (ref <=45)
Small LDL Particle Number: 610 nmol/L — ABNORMAL HIGH (ref <=527)
Triglycerides: 82 mg/dL (ref 0–149)

## 2015-08-25 ENCOUNTER — Ambulatory Visit (HOSPITAL_COMMUNITY)
Admission: RE | Admit: 2015-08-25 | Discharge: 2015-08-25 | Disposition: A | Discharge status: Home / Self Care | Payer: Medicare Other | Source: Ambulatory Visit | Attending: Cardiology | Admitting: Cardiology

## 2015-08-25 DIAGNOSIS — R0989 Other specified symptoms and signs involving the circulatory and respiratory systems: Secondary | ICD-10-CM | POA: Diagnosis present

## 2015-08-25 DIAGNOSIS — I6523 Occlusion and stenosis of bilateral carotid arteries: Secondary | ICD-10-CM | POA: Insufficient documentation

## 2015-08-25 DIAGNOSIS — E785 Hyperlipidemia, unspecified: Secondary | ICD-10-CM | POA: Diagnosis not present

## 2015-08-31 DIAGNOSIS — R001 Bradycardia, unspecified: Secondary | ICD-10-CM | POA: Diagnosis not present

## 2015-08-31 DIAGNOSIS — R5383 Other fatigue: Secondary | ICD-10-CM | POA: Diagnosis not present

## 2015-08-31 DIAGNOSIS — R5381 Other malaise: Secondary | ICD-10-CM | POA: Diagnosis not present

## 2015-09-30 DIAGNOSIS — Z23 Encounter for immunization: Secondary | ICD-10-CM | POA: Diagnosis not present

## 2015-10-05 ENCOUNTER — Encounter: Payer: Self-pay | Admitting: Geriatric Medicine

## 2015-10-07 ENCOUNTER — Encounter: Payer: Self-pay | Admitting: Internal Medicine

## 2015-10-07 ENCOUNTER — Ambulatory Visit (INDEPENDENT_AMBULATORY_CARE_PROVIDER_SITE_OTHER): Payer: Medicare Other | Admitting: Internal Medicine

## 2015-10-07 VITALS — BP 112/64 | HR 53 | Temp 98.3°F | Resp 14 | Ht 65.5 in | Wt 134.4 lb

## 2015-10-07 DIAGNOSIS — I70209 Unspecified atherosclerosis of native arteries of extremities, unspecified extremity: Secondary | ICD-10-CM

## 2015-10-07 DIAGNOSIS — M545 Low back pain, unspecified: Secondary | ICD-10-CM

## 2015-10-07 DIAGNOSIS — M5416 Radiculopathy, lumbar region: Secondary | ICD-10-CM | POA: Diagnosis not present

## 2015-10-07 NOTE — Progress Notes (Signed)
   Subjective:    Patient ID: Wanda Rivera, female    DOB: Oct 01, 1941, 74 y.o.   MRN: [id number]  HPI The patient is a 74 YO female coming in for follow up of back pain. She was seen last month and placed on tramadol for a couple weeks as well as gabapentin. She has stopped the tramadol and is taking gabapentin (100 mg nightly) and is still pain free. She is starting to exercise some again since the pain is finally better. No numbness or weakness in her legs. No falls or injuries since last visit.   Review of Systems  Constitutional: Negative.   Respiratory: Negative.   Cardiovascular: Negative.   Gastrointestinal: Negative for nausea, vomiting, abdominal pain, diarrhea, constipation and blood in stool.  Musculoskeletal: Negative for myalgias, back pain, arthralgias, gait problem, neck pain and neck stiffness.  Skin: Negative.   Neurological: Negative.       Objective:   Physical Exam  Constitutional: She is oriented to person, place, and time. She appears well-developed and well-nourished.  HENT:  Head: Normocephalic and atraumatic.  Eyes: EOM are normal.  Neck: Normal range of motion.  Cardiovascular: Normal rate and regular rhythm.   Pulmonary/Chest: Effort normal. No respiratory distress. She has no wheezes. She has no rales.  Abdominal: Soft. She exhibits no distension. There is no tenderness. There is no rebound and no guarding.  Musculoskeletal: She exhibits no edema.  Neurological: She is alert and oriented to person, place, and time.  Skin: Skin is warm and dry.   Filed Vitals:   10/07/15 1321  BP: 112/64  Pulse: 53  Temp: 98.3 F (36.8 C)  TempSrc: Oral  Resp: 14  Height: 5' 5.5" (1.664 m)  Weight: 134 lb 6.4 oz (60.963 kg)  SpO2: 97%      Assessment & Plan:

## 2015-10-07 NOTE — Assessment & Plan Note (Signed)
Is off tramadol and now using gabapentin 100 mg nightly. Will advise her to continue for 1 week while restarting her exercise regimen. Then she will stop and given exercises today for strengthening the back muscles to avoid re-injury.

## 2015-10-07 NOTE — Progress Notes (Signed)
Pre visit review using our clinic review tool, if applicable. No additional management support is needed unless otherwise documented below in the visit note. 

## 2015-10-07 NOTE — Patient Instructions (Signed)
We will have you take the gabapentin for another week then you can stop.   I will give you some information about back stretches so that you can work on avoiding further problems.   Back Exercises If you have pain in your back, do these exercises 2-3 times each day or as told by your doctor. When the pain goes away, do the exercises once each day, but repeat the steps more times for each exercise (do more repetitions). If you do not have pain in your back, do these exercises once each day or as told by your doctor. EXERCISES Single Knee to Chest Do these steps 3-5 times in a row for each leg: 1. Lie on your back on a firm bed or the floor with your legs stretched out. 2. Bring one knee to your chest. 3. Hold your knee to your chest by grabbing your knee or thigh. 4. Pull on your knee until you feel a gentle stretch in your lower back. 5. Keep doing the stretch for 10-30 seconds. 6. Slowly let go of your leg and straighten it. Pelvic Tilt Do these steps 5-10 times in a row: 1. Lie on your back on a firm bed or the floor with your legs stretched out. 2. Bend your knees so they point up to the ceiling. Your feet should be flat on the floor. 3. Tighten your lower belly (abdomen) muscles to press your lower back against the floor. This will make your tailbone point up to the ceiling instead of pointing down to your feet or the floor. 4. Stay in this position for 5-10 seconds while you gently tighten your muscles and breathe evenly. Cat-Cow Do these steps until your lower back bends more easily: 1. Get on your hands and knees on a firm surface. Keep your hands under your shoulders, and keep your knees under your hips. You may put padding under your knees. 2. Let your head hang down, and make your tailbone point down to the floor so your lower back is round like the back of a cat. 3. Stay in this position for 5 seconds. 4. Slowly lift your head and make your tailbone point up to the ceiling so  your back hangs low (sags) like the back of a cow. 5. Stay in this position for 5 seconds. Press-Ups Do these steps 5-10 times in a row: 1. Lie on your belly (face-down) on the floor. 2. Place your hands near your head, about shoulder-width apart. 3. While you keep your back relaxed and keep your hips on the floor, slowly straighten your arms to raise the top half of your body and lift your shoulders. Do not use your back muscles. To make yourself more comfortable, you may change where you place your hands. 4. Stay in this position for 5 seconds. 5. Slowly return to lying flat on the floor. Bridges Do these steps 10 times in a row: 1. Lie on your back on a firm surface. 2. Bend your knees so they point up to the ceiling. Your feet should be flat on the floor. 3. Tighten your butt muscles and lift your butt off of the floor until your waist is almost as high as your knees. If you do not feel the muscles working in your butt and the back of your thighs, slide your feet 1-2 inches farther away from your butt. 4. Stay in this position for 3-5 seconds. 5. Slowly lower your butt to the floor, and let your butt muscles  relax. If this exercise is too easy, try doing it with your arms crossed over your chest. Belly Crunches Do these steps 5-10 times in a row: 1. Lie on your back on a firm bed or the floor with your legs stretched out. 2. Bend your knees so they point up to the ceiling. Your feet should be flat on the floor. 3. Cross your arms over your chest. 4. Tip your chin a little bit toward your chest but do not bend your neck. 5. Tighten your belly muscles and slowly raise your chest just enough to lift your shoulder blades a tiny bit off of the floor. 6. Slowly lower your chest and your head to the floor. Back Lifts Do these steps 5-10 times in a row: 1. Lie on your belly (face-down) with your arms at your sides, and rest your forehead on the floor. 2. Tighten the muscles in your legs and  your butt. 3. Slowly lift your chest off of the floor while you keep your hips on the floor. Keep the back of your head in line with the curve in your back. Look at the floor while you do this. 4. Stay in this position for 3-5 seconds. 5. Slowly lower your chest and your face to the floor. GET HELP IF:  Your back pain gets a lot worse when you do an exercise.  Your back pain does not lessen 2 hours after you exercise. If you have any of these problems, stop doing the exercises. Do not do them again unless your doctor says it is okay. GET HELP RIGHT AWAY IF:  You have sudden, very bad back pain. If this happens, stop doing the exercises. Do not do them again unless your doctor says it is okay.   This information is not intended to replace advice given to you by your health care provider. Make sure you discuss any questions you have with your health care provider.   Document Released: 01/19/2011 Document Revised: 09/07/2015 Document Reviewed: 02/10/2015 Elsevier Interactive Patient Education [employer].

## 2015-10-17 ENCOUNTER — Other Ambulatory Visit: Payer: Self-pay

## 2015-10-17 MED ORDER — "INSULIN SYRINGE-NEEDLE U-100 31G X 5/16"" 0.3 ML MISC": Status: DC

## 2015-11-28 ENCOUNTER — Other Ambulatory Visit: Payer: Self-pay | Admitting: Internal Medicine

## 2015-12-02 DIAGNOSIS — H401123 Primary open-angle glaucoma, left eye, severe stage: Secondary | ICD-10-CM | POA: Diagnosis not present

## 2015-12-02 DIAGNOSIS — H401112 Primary open-angle glaucoma, right eye, moderate stage: Secondary | ICD-10-CM | POA: Diagnosis not present

## 2015-12-09 ENCOUNTER — Ambulatory Visit (INDEPENDENT_AMBULATORY_CARE_PROVIDER_SITE_OTHER)
Admission: RE | Admit: 2015-12-09 | Discharge: 2015-12-09 | Disposition: A | Discharge status: Home / Self Care | Payer: Medicare Other | Source: Ambulatory Visit | Attending: Internal Medicine | Admitting: Internal Medicine

## 2015-12-09 ENCOUNTER — Telehealth: Payer: Self-pay | Admitting: Internal Medicine

## 2015-12-09 ENCOUNTER — Encounter: Payer: Self-pay | Admitting: Internal Medicine

## 2015-12-09 ENCOUNTER — Other Ambulatory Visit (INDEPENDENT_AMBULATORY_CARE_PROVIDER_SITE_OTHER): Payer: Medicare Other

## 2015-12-09 ENCOUNTER — Ambulatory Visit (INDEPENDENT_AMBULATORY_CARE_PROVIDER_SITE_OTHER): Payer: Medicare Other | Admitting: Internal Medicine

## 2015-12-09 VITALS — BP 118/80 | HR 62 | Temp 98.0°F | Resp 12 | Ht 65.0 in | Wt 135.0 lb

## 2015-12-09 DIAGNOSIS — E785 Hyperlipidemia, unspecified: Secondary | ICD-10-CM

## 2015-12-09 DIAGNOSIS — Z0000 Encounter for general adult medical examination without abnormal findings: Secondary | ICD-10-CM

## 2015-12-09 DIAGNOSIS — Z780 Asymptomatic menopausal state: Secondary | ICD-10-CM

## 2015-12-09 DIAGNOSIS — Z23 Encounter for immunization: Secondary | ICD-10-CM | POA: Diagnosis not present

## 2015-12-09 DIAGNOSIS — Z1211 Encounter for screening for malignant neoplasm of colon: Secondary | ICD-10-CM

## 2015-12-09 LAB — LIPID PANEL
Cholesterol: 182 mg/dL (ref 0–200)
HDL: 45 mg/dL (ref >39.00)
LDL Cholesterol: 122 mg/dL — ABNORMAL HIGH (ref 0–99)
NONHDL: 136.58
Total CHOL/HDL Ratio: 4
Triglycerides: 72 mg/dL (ref 0.0–149.0)
VLDL: 14.4 mg/dL (ref 0.0–40.0)

## 2015-12-09 LAB — COMPREHENSIVE METABOLIC PANEL
ALT: 9 U/L (ref 0–35)
AST: 18 U/L (ref 0–37)
Albumin: 3.9 g/dL (ref 3.5–5.2)
Alkaline Phosphatase: 73 U/L (ref 39–117)
BILIRUBIN TOTAL: 0.5 mg/dL (ref 0.2–1.2)
BUN: 15 mg/dL (ref 6–23)
CO2: 31 mEq/L (ref 19–32)
Calcium: 9.2 mg/dL (ref 8.4–10.5)
Chloride: 106 mEq/L (ref 96–112)
Creatinine, Ser: 1.01 mg/dL (ref 0.40–1.20)
GFR: 69.05 mL/min (ref >60.00)
GLUCOSE: 90 mg/dL (ref 70–99)
POTASSIUM: 4.6 meq/L (ref 3.5–5.1)
SODIUM: 142 meq/L (ref 135–145)
TOTAL PROTEIN: 6.5 g/dL (ref 6.0–8.3)

## 2015-12-09 NOTE — Patient Instructions (Signed)
We are going to get you in with the GI doctor for the colonoscopy and also downstairs for the bone density.   We would like you to go downstairs to the lab for blood work.   Health Maintenance, Female Adopting a healthy lifestyle and getting preventive care can go a long way to promote health and wellness. Talk with your health care provider about what schedule of regular examinations is right for you. This is a good chance for you to check in with your provider about disease prevention and staying healthy. In between checkups, there are plenty of things you can do on your own. Experts have done a lot of research about which lifestyle changes and preventive measures are most likely to keep you healthy. Ask your health care provider for more information. WEIGHT AND DIET  Eat a healthy diet  Be sure to include plenty of vegetables, fruits, low-fat dairy products, and lean protein.  Do not eat a lot of foods high in solid fats, added sugars, or salt.  Get regular exercise. This is one of the most important things you can do for your health.  Most adults should exercise for at least 150 minutes each week. The exercise should increase your heart rate and make you sweat (moderate-intensity exercise).  Most adults should also do strengthening exercises at least twice a week. This is in addition to the moderate-intensity exercise.  Maintain a healthy weight  Body mass index (BMI) is a measurement that can be used to identify possible weight problems. It estimates body fat based on height and weight. Your health care provider can help determine your BMI and help you achieve or maintain a healthy weight.  For females 30 years of age and older:   A BMI below 18.5 is considered underweight.  A BMI of 18.5 to 24.9 is normal.  A BMI of 25 to 29.9 is considered overweight.  A BMI of 30 and above is considered obese.  Watch levels of cholesterol and blood lipids  You should start having your  blood tested for lipids and cholesterol at 73 years of age, then have this test every 5 years.  You may need to have your cholesterol levels checked more often if:  Your lipid or cholesterol levels are high.  You are older than 73 years of age.  You are at high risk for heart disease.  CANCER SCREENING   Lung Cancer  Lung cancer screening is recommended for adults 22-39 years old who are at high risk for lung cancer because of a history of smoking.  A yearly low-dose CT scan of the lungs is recommended for people who:  Currently smoke.  Have quit within the past 15 years.  Have at least a 30-pack-year history of smoking. A pack year is smoking an average of one pack of cigarettes a day for 1 year.  Yearly screening should continue until it has been 15 years since you quit.  Yearly screening should stop if you develop a health problem that would prevent you from having lung cancer treatment.  Breast Cancer  Practice breast self-awareness. This means understanding how your breasts normally appear and feel.  It also means doing regular breast self-exams. Let your health care provider know about any changes, no matter how small.  If you are in your 20s or 30s, you should have a clinical breast exam (CBE) by a health care provider every 1-3 years as part of a regular health exam.  If you are 40  or older, have a CBE every year. Also consider having a breast X-ray (mammogram) every year.  If you have a family history of breast cancer, talk to your health care provider about genetic screening.  If you are at high risk for breast cancer, talk to your health care provider about having an MRI and a mammogram every year.  Breast cancer gene (BRCA) assessment is recommended for women who have family members with BRCA-related cancers. BRCA-related cancers include:  Breast.  Ovarian.  Tubal.  Peritoneal cancers.  Results of the assessment will determine the need for genetic  counseling and BRCA1 and BRCA2 testing. Cervical Cancer Your health care provider may recommend that you be screened regularly for cancer of the pelvic organs (ovaries, uterus, and vagina). This screening involves a pelvic examination, including checking for microscopic changes to the surface of your cervix (Pap test). You may be encouraged to have this screening done every 3 years, beginning at age 12.  For women ages 84-65, health care providers may recommend pelvic exams and Pap testing every 3 years, or they may recommend the Pap and pelvic exam, combined with testing for human papilloma virus (HPV), every 5 years. Some types of HPV increase your risk of cervical cancer. Testing for HPV may also be done on women of any age with unclear Pap test results.  Other health care providers may not recommend any screening for nonpregnant women who are considered low risk for pelvic cancer and who do not have symptoms. Ask your health care provider if a screening pelvic exam is right for you.  If you have had past treatment for cervical cancer or a condition that could lead to cancer, you need Pap tests and screening for cancer for at least 20 years after your treatment. If Pap tests have been discontinued, your risk factors (such as having a new sexual partner) need to be reassessed to determine if screening should resume. Some women have medical problems that increase the chance of getting cervical cancer. In these cases, your health care provider may recommend more frequent screening and Pap tests. Colorectal Cancer  This type of cancer can be detected and often prevented.  Routine colorectal cancer screening usually begins at 73 years of age and continues through 73 years of age.  Your health care provider may recommend screening at an earlier age if you have risk factors for colon cancer.  Your health care provider may also recommend using home test kits to check for hidden blood in the stool.  A  small camera at the end of a tube can be used to examine your colon directly (sigmoidoscopy or colonoscopy). This is done to check for the earliest forms of colorectal cancer.  Routine screening usually begins at age 49.  Direct examination of the colon should be repeated every 5-10 years through 73 years of age. However, you may need to be screened more often if early forms of precancerous polyps or small growths are found. Skin Cancer  Check your skin from head to toe regularly.  Tell your health care provider about any new moles or changes in moles, especially if there is a change in a mole's shape or color.  Also tell your health care provider if you have a mole that is larger than the size of a pencil eraser.  Always use sunscreen. Apply sunscreen liberally and repeatedly throughout the day.  Protect yourself by wearing long sleeves, pants, a wide-brimmed hat, and sunglasses whenever you are outside. HEART DISEASE,  DIABETES, AND HIGH BLOOD PRESSURE   High blood pressure causes heart disease and increases the risk of stroke. High blood pressure is more likely to develop in:  People who have blood pressure in the high end of the normal range (130-139/85-89 mm Hg).  People who are overweight or obese.  People who are African American.  If you are 42-41 years of age, have your blood pressure checked every 3-5 years. If you are 38 years of age or older, have your blood pressure checked every year. You should have your blood pressure measured twice--once when you are at a hospital or clinic, and once when you are not at a hospital or clinic. Record the average of the two measurements. To check your blood pressure when you are not at a hospital or clinic, you can use:  An automated blood pressure machine at a pharmacy.  A home blood pressure monitor.  If you are between 10 years and 87 years old, ask your health care provider if you should take aspirin to prevent strokes.  Have  regular diabetes screenings. This involves taking a blood sample to check your fasting blood sugar level.  If you are at a normal weight and have a low risk for diabetes, have this test once every three years after 73 years of age.  If you are overweight and have a high risk for diabetes, consider being tested at a younger age or more often. PREVENTING INFECTION  Hepatitis B  If you have a higher risk for hepatitis B, you should be screened for this virus. You are considered at high risk for hepatitis B if:  You were born in a country where hepatitis B is common. Ask your health care provider which countries are considered high risk.  Your parents were born in a high-risk country, and you have not been immunized against hepatitis B (hepatitis B vaccine).  You have HIV or AIDS.  You use needles to inject street drugs.  You live with someone who has hepatitis B.  You have had sex with someone who has hepatitis B.  You get hemodialysis treatment.  You take certain medicines for conditions, including cancer, organ transplantation, and autoimmune conditions. Hepatitis C  Blood testing is recommended for:  Everyone born from [birth year] through 1964.  Anyone with known risk factors for hepatitis C. Sexually transmitted infections (STIs)  You should be screened for sexually transmitted infections (STIs) including gonorrhea and chlamydia if:  You are sexually active and are younger than 73 years of age.  You are older than 73 years of age and your health care provider tells you that you are at risk for this type of infection.  Your sexual activity has changed since you were last screened and you are at an increased risk for chlamydia or gonorrhea. Ask your health care provider if you are at risk.  If you do not have HIV, but are at risk, it may be recommended that you take a prescription medicine daily to prevent HIV infection. This is called pre-exposure prophylaxis (PrEP). You are  considered at risk if:  You are sexually active and do not regularly use condoms or know the HIV status of your partner(s).  You take drugs by injection.  You are sexually active with a partner who has HIV. Talk with your health care provider about whether you are at high risk of being infected with HIV. If you choose to begin PrEP, you should first be tested for HIV. You should then  be tested every 3 months for as long as you are taking PrEP.  PREGNANCY   If you are premenopausal and you may become pregnant, ask your health care provider about preconception counseling.  If you may become pregnant, take 400 to 800 micrograms (mcg) of folic acid every day.  If you want to prevent pregnancy, talk to your health care provider about birth control (contraception). OSTEOPOROSIS AND MENOPAUSE   Osteoporosis is a disease in which the bones lose minerals and strength with aging. This can result in serious bone fractures. Your risk for osteoporosis can be identified using a bone density scan.  If you are 33 years of age or older, or if you are at risk for osteoporosis and fractures, ask your health care provider if you should be screened.  Ask your health care provider whether you should take a calcium or vitamin D supplement to lower your risk for osteoporosis.  Menopause may have certain physical symptoms and risks.  Hormone replacement therapy may reduce some of these symptoms and risks. Talk to your health care provider about whether hormone replacement therapy is right for you.  HOME CARE INSTRUCTIONS   Schedule regular health, dental, and eye exams.  Stay current with your immunizations.   Do not use any tobacco products including cigarettes, chewing tobacco, or electronic cigarettes.  If you are pregnant, do not drink alcohol.  If you are breastfeeding, limit how much and how often you drink alcohol.  Limit alcohol intake to no more than 1 drink per day for nonpregnant women. One  drink equals 12 ounces of beer, 5 ounces of wine, or 1 ounces of hard liquor.  Do not use street drugs.  Do not share needles.  Ask your health care provider for help if you need support or information about quitting drugs.  Tell your health care provider if you often feel depressed.  Tell your health care provider if you have ever been abused or do not feel safe at home.   This information is not intended to replace advice given to you by your health care provider. Make sure you discuss any questions you have with your health care provider.   Document Released: 07/02/2011 Document Revised: 01/07/2015 Document Reviewed: 11/18/2013 Elsevier Interactive Patient Education [employer].

## 2015-12-09 NOTE — Telephone Encounter (Signed)
Notified patient.

## 2015-12-09 NOTE — Progress Notes (Signed)
Pre visit review using our clinic review tool, if applicable. No additional management support is needed unless otherwise documented below in the visit note. 

## 2015-12-09 NOTE — Telephone Encounter (Signed)
Patient states she had two missed calls from our office while she was in our office today.  Do you know anything in regards?

## 2015-12-09 NOTE — Telephone Encounter (Signed)
Dr. Sharlet Salina and I have not tried to reach patient.

## 2015-12-09 NOTE — Assessment & Plan Note (Signed)
Colonoscopy due and referred to GI. Bone density due and ordered today. Given Tdap to update her immunizations. Counseled about exercise and stretching. 10 year screening recommendations given at visit.

## 2015-12-09 NOTE — Progress Notes (Signed)
   Subjective:    Patient ID: Wanda Rivera, female    DOB: [birth year], 74 y.o.   MRN: [id number]  HPI Here for medicare wellness, no new complaints. Please see A/P for status and treatment of chronic medical problems.   Diet: heart healthy Physical activity: sedentary Depression/mood screen: negative Hearing: intact to whispered voice Visual acuity: grossly normal, performs annual eye exam  ADLs: capable Fall risk: none Home safety: good Cognitive evaluation: intact to orientation, naming, recall and repetition EOL planning: adv directives discussed  I have personally reviewed and have noted 1. The patient's medical and social history - reviewed today no changes 2. Their use of alcohol, tobacco or illicit drugs 3. Their current medications and supplements 4. The patient's functional ability including ADL's, fall risks, home safety risks and hearing or visual impairment. 5. Diet and physical activities 6. Evidence for depression or mood disorders 7. Care team reviewed and updated (available in snapshot)  Review of Systems  Constitutional: Negative.   Respiratory: Negative.   Cardiovascular: Negative.   Gastrointestinal: Negative for nausea, vomiting, abdominal pain, diarrhea, constipation and blood in stool.  Musculoskeletal: Negative for myalgias, back pain, arthralgias, gait problem, neck pain and neck stiffness.  Skin: Negative.   Neurological: Negative.       Objective:   Physical Exam  Constitutional: She is oriented to person, place, and time. She appears well-developed and well-nourished.  HENT:  Head: Normocephalic and atraumatic.  Eyes: EOM are normal.  Neck: Normal range of motion.  Cardiovascular: Normal rate and regular rhythm.   Pulmonary/Chest: Effort normal. No respiratory distress. She has no wheezes. She has no rales.  Abdominal: Soft. She exhibits no distension. There is no tenderness. There is no rebound and no guarding.  Musculoskeletal: She exhibits  no edema.  Neurological: She is alert and oriented to person, place, and time.  Skin: Skin is warm and dry.   Filed Vitals:   12/09/15 1053  BP: 118/80  Pulse: 62  Temp: 98 F (36.7 C)  TempSrc: Oral  Resp: 12  Height: 5\' 5"  (1.651 m)  Weight: 135 lb (61.236 kg)  SpO2: 92%      Assessment & Plan:  Tdap given at visit

## 2015-12-16 DIAGNOSIS — H401112 Primary open-angle glaucoma, right eye, moderate stage: Secondary | ICD-10-CM | POA: Diagnosis not present

## 2015-12-16 DIAGNOSIS — H401123 Primary open-angle glaucoma, left eye, severe stage: Secondary | ICD-10-CM | POA: Diagnosis not present

## 2015-12-20 ENCOUNTER — Telehealth: Payer: Self-pay | Admitting: Internal Medicine

## 2015-12-20 NOTE — Telephone Encounter (Signed)
Inform pt about the massage bone density result. Pt stated she is taking calcium 2 day, x-tra vitamine D, orange juice. Pt also stated that she was on medication before but it was affectiing her stomach that's why she had to stop. Please advise and call pt back

## 2015-12-20 NOTE — Telephone Encounter (Signed)
That's fine, we can discuss at next visit. She can continue with the calcium and vitamin D. Work on exercising 3-4 times a week as well.

## 2015-12-20 NOTE — Telephone Encounter (Signed)
Left message informing patient that she can continue to take vit C and calcium.

## 2016-01-12 ENCOUNTER — Telehealth: Payer: Self-pay | Admitting: Gastroenterology

## 2016-01-16 NOTE — Telephone Encounter (Signed)
Dr. Silverio Decamp reviewed records and has accepted patient. Ok to schedule Direct Colon. I spoke with patient and she will call back to schedule.

## 2016-01-16 NOTE — Telephone Encounter (Signed)
ok 

## 2016-01-27 ENCOUNTER — Encounter: Payer: Self-pay | Admitting: Gastroenterology

## 2016-02-01 ENCOUNTER — Encounter: Payer: Self-pay | Admitting: Internal Medicine

## 2016-02-01 NOTE — Progress Notes (Signed)
This encounter was created in error - please disregard.

## 2016-02-28 ENCOUNTER — Ambulatory Visit (AMBULATORY_SURGERY_CENTER): Payer: Self-pay

## 2016-02-28 VITALS — Ht 64.5 in | Wt 134.2 lb

## 2016-02-28 DIAGNOSIS — Z853 Personal history of malignant neoplasm of breast: Secondary | ICD-10-CM

## 2016-02-28 DIAGNOSIS — Z1211 Encounter for screening for malignant neoplasm of colon: Secondary | ICD-10-CM

## 2016-02-28 MED ORDER — NA SULFATE-K SULFATE-MG SULF 17.5-3.13-1.6 GM/177ML PO SOLN: ORAL | Status: DC

## 2016-02-28 NOTE — Progress Notes (Signed)
Per pt, no allergies to soy or egg products.Pt not taking any weight loss meds or using  O2 at home. 

## 2016-03-13 ENCOUNTER — Ambulatory Visit (AMBULATORY_SURGERY_CENTER): Payer: Medicare Other | Admitting: Gastroenterology

## 2016-03-13 ENCOUNTER — Encounter: Payer: Self-pay | Admitting: Gastroenterology

## 2016-03-13 VITALS — BP 121/64 | HR 51 | Temp 96.8°F | Resp 10

## 2016-03-13 DIAGNOSIS — Z1211 Encounter for screening for malignant neoplasm of colon: Secondary | ICD-10-CM

## 2016-03-13 MED ORDER — SODIUM CHLORIDE 0.9 % IV SOLN: 500.0000 mL | INTRAVENOUS | Status: DC

## 2016-03-13 NOTE — Patient Instructions (Signed)
YOU HAD AN ENDOSCOPIC PROCEDURE TODAY AT THE [facility]:   Refer to the procedure report that was given to you for any specific questions about what was found during the examination.  If the procedure report does not answer your questions, please call your gastroenterologist to clarify.  If you requested that your care partner not be given the details of your procedure findings, then the procedure report has been included in a sealed envelope for you to review at your convenience later.  YOU SHOULD EXPECT: Some feelings of bloating in the abdomen. Passage of more gas than usual.  Walking can help get rid of the air that was put into your GI tract during the procedure and reduce the bloating. If you had a lower endoscopy (such as a colonoscopy or flexible sigmoidoscopy) you may notice spotting of blood in your stool or on the toilet paper. If you underwent a bowel prep for your procedure, you may not have a normal bowel movement for a few days.  Please Note:  You might notice some irritation and congestion in your nose or some drainage.  This is from the oxygen used during your procedure.  There is no need for concern and it should clear up in a day or so.  SYMPTOMS TO REPORT IMMEDIATELY:   Following lower endoscopy (colonoscopy or flexible sigmoidoscopy):  Excessive amounts of blood in the stool  Significant tenderness or worsening of abdominal pains  Swelling of the abdomen that is new, acute  Fever of 100F or higher   For urgent or emergent issues, a gastroenterologist can be reached at any hour by calling [phone].   DIET: Your first meal following the procedure should be a small meal and then it is ok to progress to your normal diet. Heavy or fried foods are harder to digest and may make you feel nauseous or bloated.  Likewise, meals heavy in dairy and vegetables can increase bloating.  Drink plenty of fluids but you should avoid alcoholic beverages for 24  hours.  ACTIVITY:  You should plan to take it easy for the rest of today and you should NOT DRIVE or use heavy machinery until tomorrow (because of the sedation medicines used during the test).    FOLLOW UP: Our staff will call the number listed on your records the next business day following your procedure to check on you and address any questions or concerns that you may have regarding the information given to you following your procedure. If we do not reach you, we will leave a message.  However, if you are feeling well and you are not experiencing any problems, there is no need to return our call.  We will assume that you have returned to your regular daily activities without incident.  If any biopsies were taken you will be contacted by phone or by letter within the next 1-3 weeks.  Please call us at [phone] if you have not heard about the biopsies in 3 weeks.    SIGNATURES/CONFIDENTIALITY: You and/or your care partner have signed paperwork which will be entered into your electronic medical record.  These signatures attest to the fact that that the information above on your After Visit Summary has been reviewed and is understood.  Full responsibility of the confidentiality of this discharge information lies with you and/or your care-partner.   Resume medications. 

## 2016-03-13 NOTE — Progress Notes (Signed)
A/ox3 pleased with MAC, report to Sheila RN 

## 2016-03-13 NOTE — Op Note (Signed)
[facility] Patient Name: Wanda Rivera Procedure Date: 03/13/2016 1:59 PM MRN: [id number] Endoscopist: Mauri Pole , MD Age: 75 Referring MD:  Date of Birth: 1941/11/28 Gender: Female Procedure:                Colonoscopy Indications:              Screening for colorectal malignant neoplasm Medicines:                Propofol total dose 200 mg IV, Monitored Anesthesia                            Care Procedure:                Pre-Anesthesia Assessment:                           - Prior to the procedure, a History and Physical                            was performed, and patient medications and                            allergies were reviewed. The patient's tolerance of                            previous anesthesia was also reviewed. The risks                            and benefits of the procedure and the sedation                            options and risks were discussed with the patient.                            All questions were answered, and informed consent                            was obtained. Prior Anticoagulants: The patient has                            taken no previous anticoagulant or antiplatelet                            agents. ASA Grade Assessment: II - A patient with                            mild systemic disease. After reviewing the risks                            and benefits, the patient was deemed in                            satisfactory condition to undergo the procedure.  After obtaining informed consent, the colonoscope                            was passed under direct vision. Throughout the                            procedure, the patient's blood pressure, pulse, and                            oxygen saturations were monitored continuously. The                            Model CF-HQ190L [id number]) scope was introduced                            through the anus and advanced to the the terminal                             ileum, with identification of the appendiceal                            orifice and IC valve. The colonoscopy was performed                            without difficulty. The patient tolerated the                            procedure well. The quality of the bowel                            preparation was good. The terminal ileum, ileocecal                            valve, appendiceal orifice, and rectum were                            photographed. Scope In: 2:04:58 PM Scope Out: 2:25:49 PM Scope Withdrawal Time: 0 hours 9 minutes 17 seconds  Total Procedure Duration: 0 hours 20 minutes 51 seconds  Findings:      The perianal and digital rectal examinations were normal.      Non-bleeding internal hemorrhoids were found during retroflexion. The       hemorrhoids were small.      The exam was otherwise without abnormality. Complications:            No immediate complications. Estimated Blood Loss:     Estimated blood loss: none. Impression:               - Non-bleeding internal hemorrhoids.                           - The examination was otherwise normal.                           - No specimens collected. Recommendation:           -  Patient has a contact number available for                            emergencies. The signs and symptoms of potential                            delayed complications were discussed with the                            patient. Return to normal activities tomorrow.                            Written discharge instructions were provided to the                            patient.                           - Resume previous diet.                           - Continue present medications.                           - Repeat colonoscopy in 10 years for screening                            purposes. Procedure Code(s):        --- Professional ---                           [id number], Colorectal cancer screening; colonoscopy on                             individual not meeting criteria for high risk CPT copyright 2016 [employer] Medical Association. All rights reserved. Mauri Pole, MD 03/13/2016 2:30:17 PM This report has been signed electronically. Number of Addenda: 0

## 2016-03-13 NOTE — Progress Notes (Signed)
Patient stating to use either arm for IV sticks or BP readings. No restrictions for either. Mastectomies greater than 25 years ago.

## 2016-03-14 ENCOUNTER — Telehealth: Payer: Self-pay

## 2016-03-14 NOTE — Telephone Encounter (Signed)
  Follow up Call-  Call back number 03/13/2016  Post procedure Call Back phone  # [phone]  Permission to leave phone message No     Patient questions:  Do you have a fever, pain , or abdominal swelling? No. Pain Score  0 *  Have you tolerated food without any problems? Yes.    Have you been able to return to your normal activities? Yes.    Do you have any questions about your discharge instructions: Diet   No. Medications  No. Follow up visit  No.  Do you have questions or concerns about your Care? No.  Actions: * If pain score is 4 or above: No action needed, pain <4.

## 2016-03-15 DIAGNOSIS — H401123 Primary open-angle glaucoma, left eye, severe stage: Secondary | ICD-10-CM | POA: Diagnosis not present

## 2016-03-15 DIAGNOSIS — H401112 Primary open-angle glaucoma, right eye, moderate stage: Secondary | ICD-10-CM | POA: Diagnosis not present

## 2016-03-20 ENCOUNTER — Encounter (HOSPITAL_COMMUNITY): Payer: Self-pay

## 2016-03-20 ENCOUNTER — Emergency Department (HOSPITAL_COMMUNITY): Payer: Medicare Other

## 2016-03-20 ENCOUNTER — Emergency Department (HOSPITAL_COMMUNITY)
Admission: EM | Admit: 2016-03-20 | Discharge: 2016-03-20 | Disposition: A | Discharge status: Home / Self Care | Payer: Medicare Other | Attending: Emergency Medicine | Admitting: Emergency Medicine

## 2016-03-20 ENCOUNTER — Ambulatory Visit (INDEPENDENT_AMBULATORY_CARE_PROVIDER_SITE_OTHER): Payer: Medicare Other | Admitting: Emergency Medicine

## 2016-03-20 VITALS — BP 120/80 | HR 105 | Temp 99.8°F | Resp 17 | Ht 64.5 in | Wt 133.0 lb

## 2016-03-20 DIAGNOSIS — Z87898 Personal history of other specified conditions: Secondary | ICD-10-CM

## 2016-03-20 DIAGNOSIS — M19011 Primary osteoarthritis, right shoulder: Secondary | ICD-10-CM | POA: Diagnosis not present

## 2016-03-20 DIAGNOSIS — R509 Fever, unspecified: Secondary | ICD-10-CM | POA: Diagnosis not present

## 2016-03-20 DIAGNOSIS — M25461 Effusion, right knee: Secondary | ICD-10-CM | POA: Diagnosis not present

## 2016-03-20 DIAGNOSIS — S99911A Unspecified injury of right ankle, initial encounter: Secondary | ICD-10-CM | POA: Diagnosis not present

## 2016-03-20 DIAGNOSIS — M25561 Pain in right knee: Secondary | ICD-10-CM | POA: Diagnosis not present

## 2016-03-20 DIAGNOSIS — R55 Syncope and collapse: Secondary | ICD-10-CM | POA: Diagnosis not present

## 2016-03-20 DIAGNOSIS — Z79899 Other long term (current) drug therapy: Secondary | ICD-10-CM | POA: Diagnosis not present

## 2016-03-20 DIAGNOSIS — S79911A Unspecified injury of right hip, initial encounter: Secondary | ICD-10-CM | POA: Diagnosis not present

## 2016-03-20 DIAGNOSIS — Y9289 Other specified places as the place of occurrence of the external cause: Secondary | ICD-10-CM | POA: Insufficient documentation

## 2016-03-20 DIAGNOSIS — H409 Unspecified glaucoma: Secondary | ICD-10-CM | POA: Diagnosis not present

## 2016-03-20 DIAGNOSIS — W1839XA Other fall on same level, initial encounter: Secondary | ICD-10-CM | POA: Diagnosis not present

## 2016-03-20 DIAGNOSIS — M25571 Pain in right ankle and joints of right foot: Secondary | ICD-10-CM

## 2016-03-20 DIAGNOSIS — Y9389 Activity, other specified: Secondary | ICD-10-CM | POA: Insufficient documentation

## 2016-03-20 DIAGNOSIS — S8991XA Unspecified injury of right lower leg, initial encounter: Secondary | ICD-10-CM | POA: Diagnosis not present

## 2016-03-20 DIAGNOSIS — M25551 Pain in right hip: Secondary | ICD-10-CM

## 2016-03-20 DIAGNOSIS — Y998 Other external cause status: Secondary | ICD-10-CM | POA: Diagnosis not present

## 2016-03-20 DIAGNOSIS — Z8679 Personal history of other diseases of the circulatory system: Secondary | ICD-10-CM | POA: Diagnosis not present

## 2016-03-20 DIAGNOSIS — S8001XA Contusion of right knee, initial encounter: Secondary | ICD-10-CM | POA: Diagnosis not present

## 2016-03-20 DIAGNOSIS — T148 Other injury of unspecified body region: Secondary | ICD-10-CM | POA: Diagnosis not present

## 2016-03-20 DIAGNOSIS — Z853 Personal history of malignant neoplasm of breast: Secondary | ICD-10-CM | POA: Diagnosis not present

## 2016-03-20 DIAGNOSIS — R404 Transient alteration of awareness: Secondary | ICD-10-CM | POA: Diagnosis not present

## 2016-03-20 DIAGNOSIS — M19012 Primary osteoarthritis, left shoulder: Secondary | ICD-10-CM | POA: Insufficient documentation

## 2016-03-20 DIAGNOSIS — T07XXXA Unspecified multiple injuries, initial encounter: Secondary | ICD-10-CM

## 2016-03-20 DIAGNOSIS — R52 Pain, unspecified: Secondary | ICD-10-CM | POA: Diagnosis not present

## 2016-03-20 DIAGNOSIS — M810 Age-related osteoporosis without current pathological fracture: Secondary | ICD-10-CM | POA: Insufficient documentation

## 2016-03-20 DIAGNOSIS — S9001XA Contusion of right ankle, initial encounter: Secondary | ICD-10-CM | POA: Diagnosis not present

## 2016-03-20 LAB — CBC
HCT: 43.3 % (ref 36.0–46.0)
Hemoglobin: 15.6 g/dL — ABNORMAL HIGH (ref 12.0–15.0)
MCH: 31.5 pg (ref 26.0–34.0)
MCHC: 36 g/dL (ref 30.0–36.0)
MCV: 87.3 fL (ref 78.0–100.0)
PLATELETS: 150 10*3/uL (ref 150–400)
RBC: 4.96 MIL/uL (ref 3.87–5.11)
RDW: 13.8 % (ref 11.5–15.5)
WBC: 9.1 10*3/uL (ref 4.0–10.5)

## 2016-03-20 LAB — BASIC METABOLIC PANEL
Anion gap: 10 (ref 5–15)
BUN: 18 mg/dL (ref 6–20)
CALCIUM: 9.7 mg/dL (ref 8.9–10.3)
CHLORIDE: 104 mmol/L (ref 101–111)
CO2: 26 mmol/L (ref 22–32)
Creatinine, Ser: 0.99 mg/dL (ref 0.44–1.00)
GFR calc non Af Amer: 55 mL/min — ABNORMAL LOW (ref >60)
GLUCOSE: 94 mg/dL (ref 65–99)
Potassium: 4.5 mmol/L (ref 3.5–5.1)
Sodium: 140 mmol/L (ref 135–145)

## 2016-03-20 LAB — URINALYSIS, ROUTINE W REFLEX MICROSCOPIC
BILIRUBIN URINE: NEGATIVE
GLUCOSE, UA: NEGATIVE mg/dL
HGB URINE DIPSTICK: NEGATIVE
KETONES UR: NEGATIVE mg/dL
Leukocytes, UA: NEGATIVE
Nitrite: NEGATIVE
Protein, ur: NEGATIVE mg/dL
Specific Gravity, Urine: 1.01 (ref 1.005–1.030)
pH: 7.5 (ref 5.0–8.0)

## 2016-03-20 LAB — GLUCOSE, POCT (MANUAL RESULT ENTRY): POC Glucose: 88 mg/dl (ref 70–99)

## 2016-03-20 LAB — CBG MONITORING, ED: GLUCOSE-CAPILLARY: 114 mg/dL — AB (ref 65–99)

## 2016-03-20 MED ORDER — ACETAMINOPHEN 325 MG PO TABS
650.0000 mg | ORAL_TABLET | Freq: Once | ORAL | Status: AC
  Administered 2016-03-20: 650 mg via ORAL
  Filled 2016-03-20: qty 2

## 2016-03-20 NOTE — Patient Instructions (Signed)
     IF you received an x-ray today, you will receive an invoice from [facility]. Please contact [facility] at [phone] with questions or concerns regarding your invoice.   IF you received labwork today, you will receive an invoice from [facility]. Please contact [facility] at [phone] with questions or concerns regarding your invoice.   Our billing staff will not be able to assist you with questions regarding bills from these companies.  You will be contacted with the lab results as soon as they are available. The fastest way to get your results is to activate your My Chart account. Instructions are located on the last page of this paperwork. If you have not heard from us regarding the results in 2 weeks, please contact this office.      

## 2016-03-20 NOTE — ED Notes (Signed)
Social gave patient info for home health.

## 2016-03-20 NOTE — Progress Notes (Signed)
Patient ID: Wanda Rivera, female   DOB: 04-24-1941, 75 y.o.   MRN: [id number]    By signing my name below, I, Essence Howell, attest that this documentation has been prepared under the direction and in the presence of Darlyne Russian, MD Electronically Signed: Ladene Artist, ED Scribe 03/20/2016 at 1:52 PM.  Chief Complaint:  Chief Complaint  Patient presents with  . Hip Pain    right side   . right hip down to ankle pain   HPI: Wanda Rivera is a 75 y.o. female, with a h/o sinus bradycardia, who reports to [facility] today complaining of a fall that occurred around 9 AM this morning. Pt states that she got out of bed when she experienced a syncopal episode and landed on her right side. Pt is unsure how long the episode lasted; no witnesses. She denies head injury, chest pain, SOB. Pt states that she landed on carpet on her right hip, with her right leg bent underneath her. She reports associated constant right hip pain, right knee pain and right ankle pain that is exacerbated with bearing weight and palpation. Pt was able to drive herself to the office today. She reports hx of 1 previous syncopal episode in 2011 that she attributes to dehydration.    Past Medical History  Diagnosis Date  . 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 Osteoporosis   . Cataract     Bil  . Glaucoma     Bil  . Breast cancer ([place])     1982, recurrence in 1985   Past Surgical History  Procedure Laterality Date  . Abdominal hysterectomy    . Breast surgery      double masectomy for breast cancer   Social History   Social History  . Marital Status: Divorced    Spouse Name: N/A  . Number of Children: N/A  . Years of Education: N/A   Social History Main Topics  . Smoking status: Never Smoker   . Smokeless tobacco: Never Used  . Alcohol Use: 0.0 oz/week    0 Standard drinks or equivalent per week  . Drug Use: No  . Sexual Activity: No   Other Topics Concern  . None   Social History Narrative   Family  History  Problem Relation Age of Onset  . Cancer Mother   . Breast cancer Mother   . Liver cancer Mother   . Diabetes Father   . Hypertension Father   . Hypertension Sister   . Diabetes Brother   . Heart disease Brother    Allergies  Allergen Reactions  . Darvon [Propoxyphene]     Darvocet-Makes her "crazy"  . Morphine And Related     Makes her "crazy"  . Percocet [Oxycodone-Acetaminophen]     Makes her "crazy"  . Vicodin [Hydrocodone-Acetaminophen]     Makes her "crazy"   Prior to Admission medications   Medication Sig Start Date End Date Taking? Authorizing Provider  acetaminophen (TYLENOL) 500 MG tablet Take 500 mg by mouth as needed.   Yes Historical Provider, MD  bimatoprost (LUMIGAN) 0.03 % ophthalmic solution 1 drop at bedtime.   Yes Historical Provider, MD  BIOTIN PO Take 1,000 mcg by mouth daily.   Yes Historical Provider, MD  brimonidine (ALPHAGAN) 0.2 % ophthalmic solution Place into both eyes 2 (two) times daily.   Yes Historical Provider, MD  Calcium Carb-Cholecalciferol (CALCIUM + D3) 600-200 MG-UNIT TABS Take by mouth 2 (two) times daily.   Yes Historical Provider,  MD  Cholecalciferol (VITAMIN D3) 400 UNITS CAPS Take by mouth.   Yes Historical Provider, MD  Cyanocobalamin (VITAMIN B 12 PO) Take 500 mg by mouth daily.   Yes Historical Provider, MD  DIGESTIVE ENZYMES PO Take by mouth daily.   Yes Historical Provider, MD  dorzolamide-timolol (COSOPT) 22.3-6.8 MG/ML ophthalmic solution Place 1 drop into both eyes 2 (two) times daily.  12/03/14  Yes Historical Provider, MD  meclizine (ANTIVERT) 12.5 MG tablet Take 25 mg by mouth 3 (three) times daily as needed for dizziness.    Yes Historical Provider, MD  Misc Natural Products (TUMERSAID) TABS Take by mouth.   Yes Historical Provider, MD  Multiple Vitamin (ONE-A-DAY ESSENTIAL PO) Take by mouth daily.   Yes Historical Provider, MD  Omega-3 Fat Ac-Cholecalciferol (OMEGA ESSENTIALS/VIT D3) LIQD Take by mouth daily.   Yes  Historical Provider, MD  omeprazole (PRILOSEC) 20 MG capsule TAKE 1 CAPSULE TWICE DAILY 11/28/15  Yes Hoyt Koch, MD  Turmeric 500 MG TABS Take by mouth daily.   Yes Historical Provider, MD   ROS: The patient denies fevers, chills, night sweats, unintentional weight loss, -chest pain, palpitations, -SOB, nausea, vomiting, abdominal pain, dysuria, hematuria, melena, numbness, weakness, or tingling. +arthralgias, +syncope  All other systems have been reviewed and were otherwise negative with the exception of those mentioned in the HPI and as above.    PHYSICAL EXAM: Filed Vitals:   03/20/16 1221  BP: 120/80  Pulse: 105  Temp: 99.8 F (37.7 C)  Resp: 17   Body mass index is 22.48 kg/(m^2).  General: Alert, no acute distress HEENT:  Normocephalic, atraumatic, oropharynx patent. Eye: Juliette Mangle [facility] Cardiovascular: Regular rate and rhythm, no rubs murmurs or gallops. No Carotid bruits, radial pulse intact. No pedal edema.  Respiratory: Clear to auscultation bilaterally. No wheezes, rales, or rhonchi. No cyanosis, no use of accessory musculature Abdominal: No organomegaly, abdomen is soft and non-tender, positive bowel sounds. No masses. Musculoskeletal: Tender over the lateral R hip. No pelvic tenderness. Exquisite pain over the patellar and pain with any movement of the R knee. Severe pain over the R ankle with any movement.  Skin: No rashes. Patient has had a double mastectomy. Neurologic: Facial musculature symmetric. Psychiatric: Patient acts appropriately throughout our interaction. Lymphatic: No cervical or submandibular lymphadenopathy  LABS:  EKG/XRAY:   Primary read interpreted by [facility]lene Far[facility] at [facility]. No acute changes no evidence of block  ASSESSMENT/PLAN: Patient presents with an unwitnessed syncopal episode. It is unclear how long the patient was unconscious. She denies striking her head but did strike her right hip and ankle on the floor. It is unclear how long she  was out. She had no prodrome prior to fainting. She has been mentally clear since that time. She has exquisite tenderness over the right hip right knee and right ankle. There is a history in his chart of sinus bradycardia. It is suspicious that patient had some form of arrhythmia prior to her syncopal episode. We are unable to perform x-rays at the present time because there is concern about moving the patient around to perform her x-rays. EKG will be done along with a CBC and glucose. She will be transported to the hospital for further evaluation of her syncopal episode and imaging of her entire right leg.I personally performed the services described in this documentation, which was scribed in my presence. The recorded information has been reviewed and is accurate. Of note the patient also had a low-grade fever in our office.  Gross sideeffects, risk and benefits, and alternatives of medications d/w patient. Patient is aware that all medications have potential sideeffects and we are unable to predict every sideeffect or drug-drug interaction that may occur.  Arlyss Queen MD 03/20/2016 1:36 PM

## 2016-03-20 NOTE — Progress Notes (Signed)
CSW met with patient at bedside. Patient was alert and oriented. Son was present. Patient confirms that she presents to [facility] due to fall. Patient states that this is her first time falling falling in several years.  Patient informed CSW that she lives home alone in [place]. Patient states that she completes her ADL's independently. Patient informed CSW that she is not interested in facility information. Also, she states that she is not interested in speaking with someone regarding home health.  Patient informed CSW that her son is her primary support. Patient and son state that they are interested in Med alert information. CSW will provide patient with information for med alert.  Son/Sean Miah [phone]  Dalene Robards, LCSWA [phone] ED CSW 03/20/2016 8:15 PM    

## 2016-03-20 NOTE — ED Notes (Addendum)
Pt presents she had a syncopal episode this morning. Pt reported to EMS that the last thing she remembers was the clock reading 8:50 am and then she woke up around 9:15 and she was twisted up on the floor with her right leg underneath her. Pt is c/o right hip, right knee, and right ankle pain. Pt went to [facility] to take her daughter to be seen and ended up becoming a patient herself after being unable to ambulate. No shortening or deformity noted by EMS. Pt has an IV placed by EMS, left AC, CBG 126.

## 2016-03-20 NOTE — Discharge Instructions (Signed)

## 2016-03-27 ENCOUNTER — Ambulatory Visit (INDEPENDENT_AMBULATORY_CARE_PROVIDER_SITE_OTHER): Payer: Medicare Other | Admitting: Emergency Medicine

## 2016-03-27 ENCOUNTER — Ambulatory Visit (INDEPENDENT_AMBULATORY_CARE_PROVIDER_SITE_OTHER): Payer: Medicare Other

## 2016-03-27 VITALS — BP 112/68 | HR 57 | Temp 98.2°F | Resp 20 | Ht 64.5 in | Wt 134.2 lb

## 2016-03-27 DIAGNOSIS — R55 Syncope and collapse: Secondary | ICD-10-CM | POA: Diagnosis not present

## 2016-03-27 DIAGNOSIS — Z8679 Personal history of other diseases of the circulatory system: Secondary | ICD-10-CM

## 2016-03-27 DIAGNOSIS — M5441 Lumbago with sciatica, right side: Secondary | ICD-10-CM

## 2016-03-27 DIAGNOSIS — Z87898 Personal history of other specified conditions: Secondary | ICD-10-CM

## 2016-03-27 DIAGNOSIS — R509 Fever, unspecified: Secondary | ICD-10-CM

## 2016-03-27 DIAGNOSIS — S32030A Wedge compression fracture of third lumbar vertebra, initial encounter for closed fracture: Secondary | ICD-10-CM | POA: Diagnosis not present

## 2016-03-27 MED ORDER — GABAPENTIN 100 MG PO CAPS: ORAL_CAPSULE | ORAL | Status: DC

## 2016-03-27 NOTE — ED Provider Notes (Signed)
CSN: [id number]     Arrival date & time 03/20/16  1453 History   First MD Initiated Contact with Patient 03/20/16 1825     Chief Complaint  Patient presents with  . Fall  . Loss of Consciousness     (Consider location/radiation/quality/duration/timing/severity/associated sxs/prior Treatment) HPI   75 year old female presenting after syncopal episode. She was ambulating in her bathroom when she fell to the ground. She is not completely sure why. She denies any preceding prodrome. She estimates that she may have been "out" for 10-15 minutes. Woke up with severe pain in her right lower extremity. Her fitting with her ability to ambulate.She is not sure she struck her head or not. Denies any significant headache, neck or back pain.No dyspnea. No palpitations. No chest pain.  Past Medical History  Diagnosis Date  . 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 Osteoporosis   . Cataract     Bil  . Glaucoma     Bil  . Breast cancer ([place])     1982, recurrence in 1985   Past Surgical History  Procedure Laterality Date  . Abdominal hysterectomy    . Breast surgery      double masectomy for breast cancer   Family History  Problem Relation Age of Onset  . Cancer Mother   . Breast cancer Mother   . Liver cancer Mother   . Diabetes Father   . Hypertension Father   . Hypertension Sister   . Diabetes Brother   . Heart disease Brother    Social History  Substance Use Topics  . Smoking status: Never Smoker   . Smokeless tobacco: Never Used  . Alcohol Use: 0.0 oz/week    0 Standard drinks or equivalent per week   OB History    No data available     Review of Systems  All systems reviewed and negative, other than as noted in HPI.    Allergies  Darvon; Morphine and related; Percocet; and Vicodin  Home Medications   Prior to Admission medications   Medication Sig Start Date End Date Taking? Authorizing Provider  acetaminophen (TYLENOL) 500 MG tablet Take 500 mg by mouth as needed  for moderate pain or headache.    Yes Historical Provider, MD  bimatoprost (LUMIGAN) 0.03 % ophthalmic solution Place 1 drop into both eyes at bedtime.    Yes Historical Provider, MD  BIOTIN PO Take 1,000 mcg by mouth 2 (two) times daily.    Yes Historical Provider, MD  Calcium Carb-Cholecalciferol (CALCIUM + D3) 600-200 MG-UNIT TABS Take 1 tablet by mouth 2 (two) times daily.    Yes Historical Provider, MD  Cholecalciferol (VITAMIN D3) 400 UNITS CAPS Take 400 Units by mouth daily.    Yes Historical Provider, MD  Cyanocobalamin (VITAMIN B 12 PO) Take 500 mg by mouth daily.   Yes Historical Provider, MD  DIGESTIVE ENZYMES PO Take 1 capsule by mouth daily.    Yes Historical Provider, MD  dorzolamide-timolol (COSOPT) 22.3-6.8 MG/ML ophthalmic solution Place 1 drop into both eyes 2 (two) times daily.  12/03/14  Yes Historical Provider, MD  meclizine (ANTIVERT) 12.5 MG tablet Take 12.5 mg by mouth 3 (three) times daily as needed for dizziness.    Yes Historical Provider, MD  Multiple Vitamin (ONE-A-DAY ESSENTIAL PO) Take by mouth daily.   Yes Historical Provider, MD  Omega-3 Fat Ac-Cholecalciferol (OMEGA ESSENTIALS/VIT D3) LIQD Take 5 mLs by mouth daily.    Yes Historical Provider, MD  omeprazole (PRILOSEC) 20  MG capsule TAKE 1 CAPSULE TWICE DAILY 11/28/15  Yes Hoyt Koch, MD  Turmeric 500 MG TABS Take by mouth daily.   Yes Historical Provider, MD  gabapentin (NEURONTIN) 100 MG capsule Take 1 capsule 3 times a day for back pain. If persistent pain can increase  to 2 capsules 3 times a day for pain 03/27/16   Darlyne Russian, MD   BP 123/80 mmHg  Pulse 98  Temp(Src) 99.7 F (37.6 C) (Oral)  Resp 19  SpO2 94% Physical Exam  Constitutional: She appears well-developed and well-nourished. No distress.  HENT:  Head: Normocephalic and atraumatic.  Eyes: Conjunctivae are normal. Right eye exhibits no discharge. Left eye exhibits no discharge.  Neck: Neck supple.  Cardiovascular: Normal rate,  regular rhythm and normal heart sounds.  Exam reveals no gallop and no friction rub.   No murmur heard. Pulmonary/Chest: Effort normal and breath sounds normal. No respiratory distress.  Abdominal: Soft. She exhibits no distension. There is no tenderness.  Musculoskeletal: She exhibits no edema or tenderness.  Small amount of right knee swelling. Soft tissue versus effusion?Mild diffuse tenderness anteriorly and along the lateral aspect.can actively range although with increased pain.No ligamentous laxity appreciated. Both active and passive range of motion of the right hip although she can actively range as well. Tenderness over the lateral malleolus of the right ankle. Good DP pulse. Sensation is intact to light touch. No midline spinal tenderness.  Neurological: She is alert.  Skin: Skin is warm and dry.  Psychiatric: She has a normal mood and affect. Her behavior is normal. Thought content normal.  Nursing note and vitals reviewed.   ED Course  Procedures (including critical care time) Labs Review Labs Reviewed  BASIC METABOLIC PANEL - Abnormal; Notable for the following:    GFR calc non Af Amer 55 (*)    All other components within normal limits  CBC - Abnormal; Notable for the following:    Hemoglobin 15.6 (*)    All other components within normal limits  CBG MONITORING, ED - Abnormal; Notable for the following:    Glucose-Capillary 114 (*)    All other components within normal limits  URINALYSIS, ROUTINE W REFLEX MICROSCOPIC (NOT AT [facility])    Imaging Review Dg Lumbar Spine Complete  03/27/2016  CLINICAL DATA:  RIGHT side low back pain with sciatica down RIGHT leg since falling 1 week ago EXAM: LUMBAR SPINE - COMPLETE 4+ VIEW COMPARISON:  08/04/2015 FINDINGS: Five non-rib-bearing lumbar vertebra. Diffuse osseous demineralization. Scattered displaced narrowing and endplate spur formation. New superior endplate compression fracture of L3 vertebral body with approximately 15% anterior  height loss. Remaining vertebral body and disc space heights appear maintained. No additional fracture, subluxation or bone destruction. Multilevel facet degenerative changes lower lumbar spine. SI joints symmetric. IMPRESSION: New superior endplate compression fracture L3 with approximately 15% anterior height loss. 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 Electronically Signed   By: Lavonia Dana M.D.   On: 03/27/2016 09:29   I have personally reviewed and evaluated these images and lab results as part of my medical decision-making.   EKG Interpretation   Date/Time:  Tuesday March 20 2016 15:06:23 EDT Ventricular Rate:  73 PR Interval:  148 QRS Duration: 76 QT Interval:  345 QTC Calculation: 380 R Axis:   70 Text Interpretation:  Sinus arrhythmia No old tracing to compare Confirmed  by Jayton Popelka  MD, [place] [id number]) on 03/20/2016 7:15:34 PM  MDM   Final diagnoses:  Syncope and collapse  Multiple contusions    75 year old female brought in for evaluation after having a syncopal event earlier today. Now currently feels better with regards to this but does have right lower extremity pain  Resulting from the fall. Pain limiting ambulation but is a closed injury and she is neurovascular intact. No osseous abnormality noted on imaging.    Virgel Manifold, MD 03/27/16 1126

## 2016-03-27 NOTE — Progress Notes (Addendum)
Patient ID: Wanda Rivera, female   DOB: 1941-02-12, 75 y.o.   MRN: [id number]    By signing my name below, I, Wanda Rivera, attest that this documentation has been prepared under the direction and in the presence of Wanda Russian, MD Electronically Signed: Ladene Artist, ED Scribe 03/27/2016 at 8:54 AM.  Chief Complaint:  Chief Complaint  Patient presents with  . Follow-up    hip pain   HPI: Wanda Rivera is a 75 y.o. female who reports to [facility] today complaining of persistent right low back s/p a fall that occurred 1 week ago. Pt reports that back pain radiates into her right buttock and right hip. Pain is exacerbated with ambulating and palpation. She currently ambulates with a cane. She reports that pain is similar to pain that she experienced last year after another fall. She was seen by her PCP last year for the same and was started on Gabapentin x90 days which provided relief. Pt denies fever, chills, chest pain, SOB.  Cardiology Pt was seen by Dr. Einar Gip last year. Normal stress test. She was told that she does not need to be seen again unless she is referred back to him.   Past Medical History  Diagnosis Date  . 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 Osteoporosis   . Cataract     Bil  . Glaucoma     Bil  . Breast cancer ([place])     1982, recurrence in 1985   Past Surgical History  Procedure Laterality Date  . Abdominal hysterectomy    . Breast surgery      double masectomy for breast cancer   Social History   Social History  . Marital Status: Divorced    Spouse Name: N/A  . Number of Children: N/A  . Years of Education: N/A   Social History Main Topics  . Smoking status: Never Smoker   . Smokeless tobacco: Never Used  . Alcohol Use: 0.0 oz/week    0 Standard drinks or equivalent per week  . Drug Use: No  . Sexual Activity: No   Other Topics Concern  . None   Social History Narrative   Family History  Problem Relation Age of Onset  . Cancer Mother   .  Breast cancer Mother   . Liver cancer Mother   . Diabetes Father   . Hypertension Father   . Hypertension Sister   . Diabetes Brother   . Heart disease Brother    Allergies  Allergen Reactions  . Darvon [Propoxyphene]     Darvocet-Makes her "crazy"  . Morphine And Related     Makes her "crazy"  . Percocet [Oxycodone-Acetaminophen]     Makes her "crazy"  . Vicodin [Hydrocodone-Acetaminophen]     Makes her "crazy"   Prior to Admission medications   Medication Sig Start Date End Date Taking? Authorizing Provider  acetaminophen (TYLENOL) 500 MG tablet Take 500 mg by mouth as needed for moderate pain or headache.    Yes Historical Provider, MD  bimatoprost (LUMIGAN) 0.03 % ophthalmic solution Place 1 drop into both eyes at bedtime.    Yes Historical Provider, MD  BIOTIN PO Take 1,000 mcg by mouth 2 (two) times daily.    Yes Historical Provider, MD  Calcium Carb-Cholecalciferol (CALCIUM + D3) 600-200 MG-UNIT TABS Take 1 tablet by mouth 2 (two) times daily.    Yes Historical Provider, MD  Cholecalciferol (VITAMIN D3) 400 UNITS CAPS Take 400 Units by mouth daily.  Yes Historical Provider, MD  Cyanocobalamin (VITAMIN B 12 PO) Take 500 mg by mouth daily.   Yes Historical Provider, MD  DIGESTIVE ENZYMES PO Take 1 capsule by mouth daily.    Yes Historical Provider, MD  dorzolamide-timolol (COSOPT) 22.3-6.8 MG/ML ophthalmic solution Place 1 drop into both eyes 2 (two) times daily.  12/03/14  Yes Historical Provider, MD  meclizine (ANTIVERT) 12.5 MG tablet Take 12.5 mg by mouth 3 (three) times daily as needed for dizziness.    Yes Historical Provider, MD  Multiple Vitamin (ONE-A-DAY ESSENTIAL PO) Take by mouth daily.   Yes Historical Provider, MD  Omega-3 Fat Ac-Cholecalciferol (OMEGA ESSENTIALS/VIT D3) LIQD Take 5 mLs by mouth daily.    Yes Historical Provider, MD  omeprazole (PRILOSEC) 20 MG capsule TAKE 1 CAPSULE TWICE DAILY 11/28/15  Yes Hoyt Koch, MD  Turmeric 500 MG TABS Take by  mouth daily.   Yes Historical Provider, MD   ROS: The patient denies fevers, chills, night sweats, unintentional weight loss, -chest pain, palpitations, wheezing, -SOB, nausea, vomiting, abdominal pain, dysuria, hematuria, melena, numbness, weakness, or tingling. +back pain  All other systems have been reviewed and were otherwise negative with the exception of those mentioned in the HPI and as above.    PHYSICAL EXAM: Filed Vitals:   03/27/16 0834  BP: 112/68  Pulse: 57  Temp: 98.2 F (36.8 C)  Resp: 20   Body mass index is 22.69 kg/(m^2).  General: Alert, no acute distress HEENT:  Normocephalic, atraumatic, oropharynx patent. Eye: Juliette Mangle [facility] Cardiovascular: Regular rate and rhythm, no rubs murmurs or gallops. No Carotid bruits, radial pulse intact. No pedal edema.  Respiratory: Clear to auscultation bilaterally. No wheezes, rales, or rhonchi. No cyanosis, no use of accessory musculature Abdominal: No organomegaly, abdomen is soft and non-tender, positive bowel sounds. No masses. Musculoskeletal: Tenderness in L5-S1 on the R and into the R buttock. DTRs are 2+. Straight leg raising is negative.  Skin: No rashes. Neurologic: Facial musculature symmetric. Psychiatric: Patient acts appropriately throughout our interaction. Lymphatic: No cervical or submandibular lymphadenopathy  LABS:  EKG/XRAY:   Primary read interpreted by Dr. Everlene Farrier at [facility].   Dg Chest 2 View  03/20/2016  CLINICAL DATA:  Syncope this morning. Right leg pain. Initial encounter. EXAM: CHEST  2 VIEW COMPARISON:  PA and lateral chest 02/22/2015. FINDINGS: Surgical clips projecting over the upper chest are unchanged. Lungs are clear. Heart size is normal. No pneumothorax or pleural effusion. No focal bony abnormality. IMPRESSION: No acute disease. Electronically Signed   By: Wanda Rivera M.D.   On: 03/20/2016 19:36   Dg Lumbar Spine Complete  03/27/2016  CLINICAL DATA:  RIGHT side low back pain with sciatica  down RIGHT leg since falling 1 week ago EXAM: LUMBAR SPINE - COMPLETE 4+ VIEW COMPARISON:  08/04/2015 FINDINGS: Five non-rib-bearing lumbar vertebra. Diffuse osseous demineralization. Scattered displaced narrowing and endplate spur formation. New superior endplate compression fracture of L3 vertebral body with approximately 15% anterior height loss. Remaining vertebral body and disc space heights appear maintained. No additional fracture, subluxation or bone destruction. Multilevel facet degenerative changes lower lumbar spine. SI joints symmetric. IMPRESSION: New superior endplate compression fracture L3 with approximately 15% anterior height loss. 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 Electronically Signed   By: Lavonia Dana M.D.   On: 03/27/2016 09:29   Dg Ankle Complete Right  03/20/2016  CLINICAL DATA:  Syncopal episode this morning.  Fell. EXAM: DG HIP (WITH OR  WITHOUT PELVIS) 2-3V RIGHT; RIGHT ANKLE - COMPLETE 3+ VIEW; RIGHT KNEE - COMPLETE 4+ VIEW COMPARISON:  None. FINDINGS: Right hip: Both hips are normally located. No acute hip fracture or plain film evidence of avascular necrosis. Minimal hip joint degenerative changes bilaterally. The pubic symphysis and SI joints are intact. No definite pelvic fractures. Right knee: The joint spaces are maintained. No acute fracture or osteochondral abnormality. Subchondral cystic change or intraosseous ganglion involving the lateral femoral condyle. Small joint effusion is noted. Right ankle: The ankle mortise is maintained. No acute ankle fracture. The mid and hindfoot bony structures are intact. Vascular calcifications are noted. IMPRESSION: No acute fracture involving the right hip, right knee or right ankle. Small right knee joint effusion. Electronically Signed   By: Marijo Sanes M.D.   On: 03/20/2016 16:29   Ct Knee Right Wo Contrast  03/20/2016  CLINICAL DATA:  Syncope with fall. Right knee  pain. Unable to ambulate. EXAM: CT OF THE right KNEE WITHOUT CONTRAST TECHNIQUE: Multidetector CT imaging of the right knee was performed according to the standard protocol. Multiplanar CT image reconstructions were also generated. COMPARISON:  Right knee radiographs 03/20/2016 FINDINGS: Degenerative changes in the right knee with mild medial and lateral compartment narrowing. Subcortical cystic changes in the posterior right lateral femoral condyle. These likely represent degenerative cysts. Mild surface contour irregularity of the lateral femoral cortex likely degenerative. No specific osteochondral lesion. Small osteophytes. Small right knee effusion. No evidence of acute fracture or dislocation. No focal bone sclerosis or bone destruction. Vascular calcifications in the popliteal artery. 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 Small effusion. No acute fracture or dislocation identified. Electronically Signed   By: Lucienne Capers M.D.   On: 03/20/2016 19:54   Dg Knee Complete 4 Views Right  03/20/2016  CLINICAL DATA:  Syncopal episode this morning.  Fell. EXAM: DG HIP (WITH OR WITHOUT PELVIS) 2-3V RIGHT; RIGHT ANKLE - COMPLETE 3+ VIEW; RIGHT KNEE - COMPLETE 4+ VIEW COMPARISON:  None. FINDINGS: Right hip: Both hips are normally located. No acute hip fracture or plain film evidence of avascular necrosis. Minimal hip joint degenerative changes bilaterally. The pubic symphysis and SI joints are intact. No definite pelvic fractures. Right knee: The joint spaces are maintained. No acute fracture or osteochondral abnormality. Subchondral cystic change or intraosseous ganglion involving the lateral femoral condyle. Small joint effusion is noted. Right ankle: The ankle mortise is maintained. No acute ankle fracture. The mid and hindfoot bony structures are intact. Vascular calcifications are noted. IMPRESSION: No acute fracture involving the right hip, right knee  or right ankle. Small right knee joint effusion. Electronically Signed   By: Marijo Sanes M.D.   On: 03/20/2016 16:29   Dg Hip Unilat  With Pelvis 2-3 Views Right  03/20/2016  CLINICAL DATA:  Syncopal episode this morning.  Fell. EXAM: DG HIP (WITH OR WITHOUT PELVIS) 2-3V RIGHT; RIGHT ANKLE - COMPLETE 3+ VIEW; RIGHT KNEE - COMPLETE 4+ VIEW COMPARISON:  None. FINDINGS: Right hip: Both hips are normally located. No acute hip fracture or plain film evidence of avascular necrosis. Minimal hip joint degenerative changes bilaterally. The pubic symphysis and SI joints are intact. No definite pelvic fractures. Right knee: The joint spaces are maintained. No acute fracture or osteochondral abnormality. Subchondral cystic change or intraosseous ganglion involving the lateral femoral condyle. Small joint effusion is noted. Right ankle: The ankle mortise is maintained. No acute ankle fracture. The mid and hindfoot bony structures  are intact. Vascular calcifications are noted. IMPRESSION: No acute fracture involving the right hip, right knee or right ankle. Small right knee joint effusion. Electronically Signed   By: Marijo Sanes M.D.   On: 03/20/2016 16:29       ASSESSMENT/PLAN: Patient having low back pain with radicular symptoms right leg. X-rays show a new L3 compression fracture. We'll proceed with CT of the back. She will also be started on Neurontin for pain relief. Referral made to Dr. Sharlyne Cai for reevaluation of syncope. I suspect her syncope was illness related and not cardiac.I personally performed the services described in this documentation, which was scribed in my presence. The recorded information has been reviewed and is accurate.    Gross sideeffects, risk and benefits, and alternatives of medications d/w patient. Patient is aware that all medications have potential sideeffects and we are unable to predict every sideeffect or drug-drug interaction that may occur.  Arlyss Queen MD 03/27/2016 8:36  AM

## 2016-03-27 NOTE — Patient Instructions (Signed)
     IF you received an x-ray today, you will receive an invoice from [facility]. Please contact [facility] at [phone] with questions or concerns regarding your invoice.   IF you received labwork today, you will receive an invoice from [facility]. Please contact [facility] at [phone] with questions or concerns regarding your invoice.   Our billing staff will not be able to assist you with questions regarding bills from these companies.  You will be contacted with the lab results as soon as they are available. The fastest way to get your results is to activate your My Chart account. Instructions are located on the last page of this paperwork. If you have not heard from us regarding the results in 2 weeks, please contact this office.      

## 2016-04-02 ENCOUNTER — Encounter: Payer: Self-pay | Admitting: Emergency Medicine

## 2016-04-02 DIAGNOSIS — R55 Syncope and collapse: Secondary | ICD-10-CM | POA: Diagnosis not present

## 2016-04-02 DIAGNOSIS — I951 Orthostatic hypotension: Secondary | ICD-10-CM | POA: Diagnosis not present

## 2016-04-02 DIAGNOSIS — R001 Bradycardia, unspecified: Secondary | ICD-10-CM | POA: Diagnosis not present

## 2016-04-05 DIAGNOSIS — R55 Syncope and collapse: Secondary | ICD-10-CM | POA: Diagnosis not present

## 2016-04-30 DIAGNOSIS — R0989 Other specified symptoms and signs involving the circulatory and respiratory systems: Secondary | ICD-10-CM | POA: Diagnosis not present

## 2016-04-30 DIAGNOSIS — I951 Orthostatic hypotension: Secondary | ICD-10-CM | POA: Diagnosis not present

## 2016-04-30 DIAGNOSIS — I6523 Occlusion and stenosis of bilateral carotid arteries: Secondary | ICD-10-CM | POA: Diagnosis not present

## 2016-04-30 DIAGNOSIS — R001 Bradycardia, unspecified: Secondary | ICD-10-CM | POA: Diagnosis not present

## 2016-05-02 ENCOUNTER — Ambulatory Visit
Admission: RE | Admit: 2016-05-02 | Discharge: 2016-05-02 | Disposition: A | Discharge status: Home / Self Care | Payer: Medicare Other | Source: Ambulatory Visit | Attending: Emergency Medicine | Admitting: Emergency Medicine

## 2016-05-02 DIAGNOSIS — S32030A Wedge compression fracture of third lumbar vertebra, initial encounter for closed fracture: Secondary | ICD-10-CM

## 2016-05-03 ENCOUNTER — Telehealth: Payer: Self-pay

## 2016-05-03 NOTE — Telephone Encounter (Signed)
-----   Message from Darlyne Russian, MD sent at 05/02/2016  2:53 PM EDT ----- CT shows a subacute fracture at L3. The compression is approximately 40%. I would advise she return to clinic to discuss with me either Friday of this week or Monday or Tuesday next week.

## 2016-05-03 NOTE — Telephone Encounter (Signed)
IC patient the result note per Dr. Everlene Farrier.  Pt states she is not in pain and will come in on Friday 5/5 for follow up.

## 2016-05-04 ENCOUNTER — Ambulatory Visit (INDEPENDENT_AMBULATORY_CARE_PROVIDER_SITE_OTHER): Payer: Medicare Other | Admitting: Emergency Medicine

## 2016-05-04 VITALS — BP 118/60 | HR 58 | Temp 98.5°F | Resp 16 | Ht 64.0 in | Wt 137.0 lb

## 2016-05-04 DIAGNOSIS — S32030A Wedge compression fracture of third lumbar vertebra, initial encounter for closed fracture: Secondary | ICD-10-CM | POA: Insufficient documentation

## 2016-05-04 DIAGNOSIS — S32030D Wedge compression fracture of third lumbar vertebra, subsequent encounter for fracture with routine healing: Secondary | ICD-10-CM | POA: Diagnosis not present

## 2016-05-04 NOTE — Patient Instructions (Addendum)
   IF you received an x-ray today, you will receive an invoice from [facility]. Please contact [facility] at [phone] with questions or concerns regarding your invoice.   IF you received labwork today, you will receive an invoice from [facility]. Please contact [facility] at [phone] with questions or concerns regarding your invoice.   Our billing staff will not be able to assist you with questions regarding bills from these companies.  You will be contacted with the lab results as soon as they are available. The fastest way to get your results is to activate your My Chart account. Instructions are located on the last page of this paperwork. If you have not heard from us regarding the results in 2 weeks, please contact this office.      Vertebral Fracture A vertebral fracture is a break in one of the bones that make up the spine (vertebrae). The vertebrae are stacked on top of each other to form the spinal column. They support the body and protect the spinal cord. The vertebral column has an upper part (cervical spine), a middle part (thoracic spine), and a lower part (lumbar spine). Most vertebral fractures occur in the thoracic spine or lumbar spine. There are three main types of vertebral fractures:  Flexion fracture. This happens when vertebrae collapse. Vertebrae can collapse:  In the front (compression fracture). This type of fracture is common in people who have a condition that causes their bones to be weak and brittle (osteoporosis). The fracture can make a person lose height.  In the front and back (axial burst fracture).  Extension fracture. This happens when an external force pulls apart the vertebrae.  Rotation fracture. This happens when the spine bends extremely in one direction. This type can cause a piece of a vertebra to break off (transverse process fracture) or move out of its normal position (fracture dislocation).  This type of fracture has a high risk for spinal cord injury. Vertebral fractures can range from mild to very severe. The most severe types are those that cause the broken bones to move out of place (unstable) and those that injure or press on the spinal cord. CAUSES This condition is usually caused by a forceful injury. This type of injury commonly results from:  Car accidents.  Falling or jumping from a great height.  Collisions in contact sports.  Violent acts, such as an assault or a gunshot wound. RISK FACTORS This injury is more likely to happen to people who:  Have osteoporosis.  Participate in contact sports.  Are in situations that could result in falls or other violent injuries. SYMPTOMS Symptoms of this injury depend on the location and the type of fracture. The most common symptom is back pain that gets worse with movement. You may also have trouble standing or walking. If a fracture has damaged your spinal cord or is pressing on it, you may also have:  Numbness.  Tingling.  Weakness.  Loss of movement.  Loss of bowel or bladder control. DIAGNOSIS This injury may be diagnosed based on symptoms, medical history, and a physical exam. You may also have imaging tests to confirm the diagnosis. These may include:  Spine X-ray.  CT scan.  MRI. TREATMENT Treatment for this injury depends on the type of fracture. If your fracture is stable and does not affect your spinal cord, it may heal with nonsurgical treatment, such as:  Taking pain medicine.  Wearing a cast or a brace.  Doing   physical therapy exercises. If your vertebral fracture is unstable or it affects your spinal cord, you may need surgical treatment, such as:  Laminectomy. This procedure involves removing the part of a vertebra that is pushing on the spinal cord (spinal decompression surgery). Bone fragments may also be removed.  Spinal fusion. This procedure is used to stabilize an unstable fracture.  Vertebrae may be joined together with a piece of bone from another part of your body (graft) and held in place with rods, plates, or screws.  Vertebroplasty. In this procedure, bone cement is used to rebuild collapsed vertebrae. HOME CARE INSTRUCTIONS General Instructions  Take medicines only as directed by your health care provider.  Do not drive or operate heavy machinery while taking pain medicine.  If directed, apply ice to the injured area:  Put ice in a plastic bag.  Place a towel between your skin and the bag.  Leave the ice on for 30 minutes every two hours at first. Then apply the ice as needed.  Wear your neck brace or back brace as directed by your health care provider.  Do not drink alcohol. Alcohol can interfere with your treatment.  Keep all follow-up visits as directed by your health care provider. This is important. It can help to prevent permanent injury, disability, and long-lasting (chronic) pain. Activity  Stay in bed (on bed rest) only as directed by your health care provider. Being on bed rest for too long can make your condition worse.  Return to your normal activities as directed by your health care provider. Ask what activities are safe for you.  Do exercises to improve motion and strength in your back (physical therapy), as recommended by your health care provider.   Exercise regularly as directed by your health care provider. SEEK MEDICAL CARE IF:  You have a fever.  You develop a cough that makes your pain worse.  Your pain medicine is not helping.  Your pain does not get better over time.  You cannot return to your normal activities as planned or expected. SEEK IMMEDIATE MEDICAL CARE IF:  Your pain is very bad and it suddenly gets worse.  You are unable to move any body part (paralysis) that is below the level of your injury.  You have numbness, tingling, or weakness in any body part that is below the level of your injury.  You cannot  control your bladder or bowels.   This information is not intended to replace advice given to you by your health care provider. Make sure you discuss any questions you have with your health care provider.   Document Released: 01/24/2005 Document Revised: 05/03/2015 Document Reviewed: 12/22/2014 Elsevier Interactive Patient Education 2016 [employer]  

## 2016-05-04 NOTE — Progress Notes (Signed)
By signing my name below, I, Raven Small, attest that this documentation has been prepared under the direction and in the presence of Arlyss Queen, MD.  Electronically Signed: Thea Alken, ED Scribe. 05/04/2016. 10:04 AM.  Chief Complaint:  Chief Complaint  Patient presents with  . Follow-up    results from CTscan    HPI: Wanda Rivera is a 75 y.o. female who reports to [facility] today for a follow up. Pt was seen here 3/28 complaining of right low back pain due to fall that occurred 1 week prior.  At that time back pain radiated into right buttock and right hip. XR showed L3 compression fx. She was then sent for a CT lumbar spine which she had done 2 days ago. CT reveled subacute 40% compression fx of L3 with 49mm posterior bony retropulsion as well as lumbar spondylosis. She denies having pain at this time. She is still taking gabapentin for pain which is working well for her. Her last bone density study was 1 year ago. She takes calcium and vitamin D. She denies syncopal episode but has a cane at home for support if needed.  She also reports improving, intermittent right ankle with occasional swelling onset since falling several years ago.    Past Medical History  Diagnosis Date  . 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 Osteoporosis   . Cataract     Bil  . Glaucoma     Bil  . Breast cancer ([place])     1982, recurrence in 1985   Past Surgical History  Procedure Laterality Date  . Abdominal hysterectomy    . Breast surgery      double masectomy for breast cancer   Social History   Social History  . Marital Status: Divorced    Spouse Name: N/A  . Number of Children: N/A  . Years of Education: N/A   Social History Main Topics  . Smoking status: Never Smoker   . Smokeless tobacco: Never Used  . Alcohol Use: 0.0 oz/week    0 Standard drinks or equivalent per week  . Drug Use: No  . Sexual Activity: No   Other Topics Concern  . None   Social History Narrative   Family History   Problem Relation Age of Onset  . Cancer Mother   . Breast cancer Mother   . Liver cancer Mother   . Diabetes Father   . Hypertension Father   . Hypertension Sister   . Diabetes Brother   . Heart disease Brother    Allergies  Allergen Reactions  . Darvon [Propoxyphene]     Darvocet-Makes her "crazy"  . Morphine And Related     Makes her "crazy"  . Percocet [Oxycodone-Acetaminophen]     Makes her "crazy"  . Vicodin [Hydrocodone-Acetaminophen]     Makes her "crazy"   Prior to Admission medications   Medication Sig Start Date End Date Taking? Authorizing Provider  acetaminophen (TYLENOL) 500 MG tablet Take 500 mg by mouth as needed for moderate pain or headache.    Yes Historical Provider, MD  bimatoprost (LUMIGAN) 0.03 % ophthalmic solution Place 1 drop into both eyes at bedtime.    Yes Historical Provider, MD  BIOTIN PO Take 1,000 mcg by mouth 2 (two) times daily.    Yes Historical Provider, MD  Calcium Carb-Cholecalciferol (CALCIUM + D3) 600-200 MG-UNIT TABS Take 1 tablet by mouth 2 (two) times daily.    Yes Historical Provider, MD  Cholecalciferol (VITAMIN D3)  400 UNITS CAPS Take 400 Units by mouth daily.    Yes Historical Provider, MD  Cyanocobalamin (VITAMIN B 12 PO) Take 500 mg by mouth daily.   Yes Historical Provider, MD  DIGESTIVE ENZYMES PO Take 1 capsule by mouth daily.    Yes Historical Provider, MD  dorzolamide-timolol (COSOPT) 22.3-6.8 MG/ML ophthalmic solution Place 1 drop into both eyes 2 (two) times daily.  12/03/14  Yes Historical Provider, MD  gabapentin (NEURONTIN) 100 MG capsule Take 1 capsule 3 times a day for back pain. If persistent pain can increase  to 2 capsules 3 times a day for pain 03/27/16  Yes Darlyne Russian, MD  meclizine (ANTIVERT) 12.5 MG tablet Take 12.5 mg by mouth 3 (three) times daily as needed for dizziness.    Yes Historical Provider, MD  Multiple Vitamin (ONE-A-DAY ESSENTIAL PO) Take by mouth daily.   Yes Historical Provider, MD  Omega-3 Fat  Ac-Cholecalciferol (OMEGA ESSENTIALS/VIT D3) LIQD Take 5 mLs by mouth daily.    Yes Historical Provider, MD  omeprazole (PRILOSEC) 20 MG capsule TAKE 1 CAPSULE TWICE DAILY 11/28/15  Yes Hoyt Koch, MD  Turmeric 500 MG TABS Take by mouth daily.   Yes Historical Provider, MD     ROS: The patient denies fevers, chills, night sweats, unintentional weight loss, chest pain, palpitations, wheezing, dyspnea on exertion, nausea, vomiting, abdominal pain, dysuria, hematuria, melena, numbness, weakness, or tingling.   All other systems have been reviewed and were otherwise negative with the exception of those mentioned in the HPI and as above.    PHYSICAL EXAM: Filed Vitals:   05/04/16 0840  BP: 118/60  Pulse: 58  Temp: 98.5 F (36.9 C)  Resp: 16   Body mass index is 23.5 kg/(m^2).   General: Alert, no acute distress HEENT:  Normocephalic, atraumatic, oropharynx patent. Eye: Juliette Mangle [facility] Cardiovascular:  Regular rate and rhythm, no rubs murmurs or gallops.  No Carotid bruits, radial pulse intact. No pedal edema.  Respiratory: Clear to auscultation bilaterally.  No wheezes, rales, or rhonchi.  No cyanosis, no use of accessory musculature Abdominal: No organomegaly, abdomen is soft and non-tender, positive bowel sounds.  No masses. Musculoskeletal: Gait intact. No edema, tenderness Skin: No rashes. Neurologic: Facial musculature symmetric. Psychiatric: Patient acts appropriately throughout our interaction. Lymphatic: No cervical or submandibular lymphadenopathy    EKG/XRAY:   Primary read interpreted by Dr. Everlene Farrier at [facility]. Ct Lumbar Spine Wo Contrast  05/02/2016  CLINICAL DATA:  Low back pain. History of compression fracture. History of breast cancer. Syncope on March 2017. Bilateral leg weakness. EXAM: CT LUMBAR SPINE WITHOUT CONTRAST TECHNIQUE: Multidetector CT imaging of the lumbar spine was performed without intravenous contrast administration. Multiplanar CT image  reconstructions were also generated. COMPARISON:  03/27/2016 FINDINGS: The lowest lumbar type non-rib-bearing vertebra is labeled as L5. 40% superior endplate compression fracture at L3 with 2 mm posterior bony retropulsion. Fracture plane visible, sclerosis along the superior endplate indicating subacute chronicity. Expanded intervertebral disc height at L2-3 favoring benign etiology of the fracture. Mild loss of intervertebral disc height at L4-5 and L5-S1. Bone island in the right S1 vertebral level. There is high density in the left anterior abdomen on image 56/5, likely barium within bowel. Additional findings at individual levels are as follows: L1-2:  No impingement, mild disc bulge. L2-3: No impingement. Disc bulge and suspected right inferior foraminal disc protrusion. Mild midline spurring and minimal posterior retropulsion as noted above. L3-4: Mild bilateral foraminal stenosis and mild displacement of both  L3 nerves in the lateral extraforaminal space due to underlying disc bulge. L4-5: Mild left and borderline right foraminal stenosis due to disc bulge and facet arthropathy along with left inferior foraminal disc protrusion. Probable left subarticular lateral recess stenosis. L5-S1: Mild bilateral foraminal stenosis due to disc bulge, intervertebral spurring, and facet arthropathy. IMPRESSION: 1. Subacute 40% compression fracture of L3 with 2 mm posterior bony retropulsion. The fracture has generally benign imaging characteristics. 2. Lumbar spondylosis and degenerative disc disease causing mild impingement at L3-4, L4-5, and L5-S1. Electronically Signed   By: Van Clines M.D.   On: 05/02/2016 12:12     ASSESSMENT/PLAN: Patient is doing great. She has very minimal discomfort. She takes 2 Neurontin twice a day. She is ready to resume exercise. She has been on calcium and vitamin D. She did not tolerate biphosphonate. She will follow-up with Dr. Sharlet Salina. Would advise repeat film in about 3  months. She did have 2 mm of retropulsion on the CT scan but has no neurological symptoms to the lower extremities. I personally performed the services described in this documentation, which was scribed in my presence. The recorded information has been reviewed and is accurate.  Gross sideeffects, risk and benefits, and alternatives of medications d/w patient. Patient is aware that all medications have potential sideeffects and we are unable to predict every sideeffect or drug-drug interaction that may occur.  Arlyss Queen MD 05/04/2016 10:03 AM

## 2016-06-05 DIAGNOSIS — M545 Low back pain: Secondary | ICD-10-CM | POA: Diagnosis not present

## 2016-06-05 DIAGNOSIS — Z808 Family history of malignant neoplasm of other organs or systems: Secondary | ICD-10-CM | POA: Diagnosis not present

## 2016-06-27 ENCOUNTER — Other Ambulatory Visit (INDEPENDENT_AMBULATORY_CARE_PROVIDER_SITE_OTHER): Payer: Medicare Other

## 2016-06-27 ENCOUNTER — Ambulatory Visit (INDEPENDENT_AMBULATORY_CARE_PROVIDER_SITE_OTHER): Payer: Medicare Other | Admitting: Internal Medicine

## 2016-06-27 ENCOUNTER — Encounter: Payer: Self-pay | Admitting: Internal Medicine

## 2016-06-27 VITALS — BP 92/60 | HR 63 | Temp 98.0°F | Resp 16 | Ht 65.0 in | Wt 137.6 lb

## 2016-06-27 DIAGNOSIS — E789 Disorder of lipoprotein metabolism, unspecified: Secondary | ICD-10-CM

## 2016-06-27 DIAGNOSIS — S32030D Wedge compression fracture of third lumbar vertebra, subsequent encounter for fracture with routine healing: Secondary | ICD-10-CM | POA: Diagnosis not present

## 2016-06-27 LAB — LIPID PANEL
CHOLESTEROL: 168 mg/dL (ref 0–200)
HDL: 44.5 mg/dL (ref >39.00)
LDL CALC: 111 mg/dL — AB (ref 0–99)
NonHDL: 123.48
TRIGLYCERIDES: 62 mg/dL (ref 0.0–149.0)
Total CHOL/HDL Ratio: 4
VLDL: 12.4 mg/dL (ref 0.0–40.0)

## 2016-06-27 NOTE — Progress Notes (Signed)
Pre visit review using our clinic review tool, if applicable. No additional management support is needed unless otherwise documented below in the visit note. 

## 2016-06-27 NOTE — Patient Instructions (Addendum)
We are checking the cholesterol today and you do not need a cholesterol medicine.   It is okay to stop the gabapentin and use it again in the future if you need it for the back.

## 2016-06-29 NOTE — Assessment & Plan Note (Signed)
Not having any more pain and recent imaging with urgent care shows routine healing. She has not fallen again since with eating more salty foods and staying hydrated. Not on any BP meds.

## 2016-06-29 NOTE — Progress Notes (Signed)
   Subjective:    Patient ID: Wanda Rivera, female    DOB: 1941-12-29, 75 y.o.   MRN: [id number]  HPI The patient is a 75 YO female coming in for follow up of feeling kind of dizzy. She had fallen and gotten compression fracture and seen at urgent care several times. She was seen in the ER in March for syncope. She has not passed out again since that time. She is eating more salty foods and being sure to drink enough water. She does not take any blood pressure medicine.   Review of Systems  Constitutional: Negative.   Respiratory: Negative.   Cardiovascular: Negative.   Gastrointestinal: Negative for nausea, vomiting, abdominal pain, diarrhea, constipation and blood in stool.  Musculoskeletal: Negative for myalgias, back pain, arthralgias, gait problem, neck pain and neck stiffness.  Skin: Negative.   Neurological: Positive for light-headedness. Negative for dizziness, syncope, speech difficulty, weakness and headaches.      Objective:   Physical Exam  Constitutional: She is oriented to person, place, and time. She appears well-developed and well-nourished.  HENT:  Head: Normocephalic and atraumatic.  Eyes: EOM are normal.  Neck: Normal range of motion.  Cardiovascular: Normal rate and regular rhythm.   Pulmonary/Chest: Effort normal. No respiratory distress. She has no wheezes. She has no rales.  Abdominal: Soft. She exhibits no distension. There is no tenderness. There is no rebound and no guarding.  Musculoskeletal: She exhibits no edema.  Neurological: She is alert and oriented to person, place, and time.  No orthostatic symptoms.   Skin: Skin is warm and dry.   Filed Vitals:   06/27/16 0959  BP: 92/60  Pulse: 63  Temp: 98 F (36.7 C)  TempSrc: Oral  Resp: 16  Height: 5\' 5"  (1.651 m)  Weight: 137 lb 9.6 oz (62.415 kg)  SpO2: 99%      Assessment & Plan:

## 2016-07-10 DIAGNOSIS — H401112 Primary open-angle glaucoma, right eye, moderate stage: Secondary | ICD-10-CM | POA: Diagnosis not present

## 2016-07-10 DIAGNOSIS — H401123 Primary open-angle glaucoma, left eye, severe stage: Secondary | ICD-10-CM | POA: Diagnosis not present

## 2016-08-28 ENCOUNTER — Other Ambulatory Visit (HOSPITAL_COMMUNITY): Payer: Self-pay | Admitting: Internal Medicine

## 2016-08-28 DIAGNOSIS — I6523 Occlusion and stenosis of bilateral carotid arteries: Secondary | ICD-10-CM

## 2016-09-06 ENCOUNTER — Ambulatory Visit (HOSPITAL_COMMUNITY)
Admission: RE | Admit: 2016-09-06 | Discharge: 2016-09-06 | Disposition: A | Discharge status: Home / Self Care | Payer: Medicare Other | Source: Ambulatory Visit | Attending: Cardiovascular Disease | Admitting: Cardiovascular Disease

## 2016-09-06 DIAGNOSIS — R51 Headache: Secondary | ICD-10-CM | POA: Diagnosis not present

## 2016-09-06 DIAGNOSIS — I6523 Occlusion and stenosis of bilateral carotid arteries: Secondary | ICD-10-CM

## 2016-09-06 DIAGNOSIS — R0989 Other specified symptoms and signs involving the circulatory and respiratory systems: Secondary | ICD-10-CM | POA: Insufficient documentation

## 2016-09-06 DIAGNOSIS — I739 Peripheral vascular disease, unspecified: Secondary | ICD-10-CM | POA: Insufficient documentation

## 2016-09-06 DIAGNOSIS — Z23 Encounter for immunization: Secondary | ICD-10-CM | POA: Diagnosis not present

## 2016-09-06 DIAGNOSIS — R42 Dizziness and giddiness: Secondary | ICD-10-CM | POA: Insufficient documentation

## 2016-09-06 DIAGNOSIS — E785 Hyperlipidemia, unspecified: Secondary | ICD-10-CM | POA: Insufficient documentation

## 2016-11-20 DIAGNOSIS — H401112 Primary open-angle glaucoma, right eye, moderate stage: Secondary | ICD-10-CM | POA: Diagnosis not present

## 2016-11-20 DIAGNOSIS — H401123 Primary open-angle glaucoma, left eye, severe stage: Secondary | ICD-10-CM | POA: Diagnosis not present

## 2016-12-10 ENCOUNTER — Encounter: Payer: Self-pay | Admitting: Internal Medicine

## 2016-12-10 ENCOUNTER — Ambulatory Visit (INDEPENDENT_AMBULATORY_CARE_PROVIDER_SITE_OTHER): Payer: Medicare Other | Admitting: Internal Medicine

## 2016-12-10 VITALS — BP 110/60 | HR 60 | Temp 98.1°F | Resp 16 | Ht 64.5 in | Wt 133.0 lb

## 2016-12-10 DIAGNOSIS — Z0000 Encounter for general adult medical examination without abnormal findings: Secondary | ICD-10-CM | POA: Diagnosis not present

## 2016-12-10 DIAGNOSIS — Z23 Encounter for immunization: Secondary | ICD-10-CM | POA: Diagnosis not present

## 2016-12-10 NOTE — Assessment & Plan Note (Signed)
Given pneumonia 23 to complete series. Already had flu shot. Tdap is up to date. Shingles is complete. Colonoscopy up to date. Dexa and mammogram up to date. Counseled on sun safety and mole surveillance as well as home safety. Given 10 year screening recommendations at visit.

## 2016-12-10 NOTE — Progress Notes (Signed)
Pre visit review using our clinic review tool, if applicable. No additional management support is needed unless otherwise documented below in the visit note. 

## 2016-12-10 NOTE — Progress Notes (Signed)
   Subjective:    Patient ID: Wanda Rivera, female    DOB: 21-Feb-1941, 75 y.o.   MRN: [id number]  HPI Here for medicare wellness, no new complaints. Please see A/P for status and treatment of chronic medical problems.   Diet: heart healthy Physical activity: sedentary, does some exercise Depression/mood screen: negative Hearing: intact to whispered voice Visual acuity: grossly normal, performs annual eye exam  ADLs: capable Fall risk: low Home safety: good Cognitive evaluation: intact to orientation, naming, recall and repetition EOL planning: adv directives discussed  I have personally reviewed and have noted 1. The patient's medical and social history - reviewed today no changes 2. Their use of alcohol, tobacco or illicit drugs 3. Their current medications and supplements 4. The patient's functional ability including ADL's, fall risks, home safety risks and hearing or visual impairment. 5. Diet and physical activities 6. Evidence for depression or mood disorders 7. Care team reviewed and updated (available in snapshot)  Review of Systems  Constitutional: Negative.   HENT: Negative.   Eyes: Negative.   Respiratory: Negative for cough, chest tightness and shortness of breath.   Cardiovascular: Negative for chest pain, palpitations and leg swelling.  Gastrointestinal: Negative for abdominal distention, abdominal pain, constipation, diarrhea, nausea and vomiting.  Musculoskeletal: Positive for arthralgias.  Skin: Negative.   Neurological: Negative.   Psychiatric/Behavioral: Negative.      Objective:   Physical Exam  Constitutional: She is oriented to person, place, and time. She appears well-developed and well-nourished.  HENT:  Head: Normocephalic and atraumatic.  Eyes: EOM are normal.  Neck: Normal range of motion.  Cardiovascular: Normal rate and regular rhythm.   Pulmonary/Chest: Effort normal and breath sounds normal. No respiratory distress. She has no wheezes.  She has no rales.  Abdominal: Soft. Bowel sounds are normal. She exhibits no distension. There is no tenderness. There is no rebound.  Musculoskeletal: She exhibits no edema.  Neurological: She is alert and oriented to person, place, and time. Coordination normal.  Skin: Skin is warm and dry.  Psychiatric: She has a normal mood and affect.   Vitals:   12/10/16 0856  BP: 110/60  Pulse: 60  Resp: 16  Temp: 98.1 F (36.7 C)  TempSrc: Oral  SpO2: 97%  Weight: 133 lb (60.3 kg)  Height: 5' 4.5" (1.638 m)      Assessment & Plan:  Pneumonia 23 given at visit.

## 2016-12-10 NOTE — Patient Instructions (Signed)
We do not need any labs today and have given you the pneumonia shot.  Health Maintenance, Female Introduction Adopting a healthy lifestyle and getting preventive care can go a long way to promote health and wellness. Talk with your health care provider about what schedule of regular examinations is right for you. This is a good chance for you to check in with your provider about disease prevention and staying healthy. In between checkups, there are plenty of things you can do on your own. Experts have done a lot of research about which lifestyle changes and preventive measures are most likely to keep you healthy. Ask your health care provider for more information. Weight and diet Eat a healthy diet  Be sure to include plenty of vegetables, fruits, low-fat dairy products, and lean protein.  Do not eat a lot of foods high in solid fats, added sugars, or salt.  Get regular exercise. This is one of the most important things you can do for your health.  Most adults should exercise for at least 150 minutes each week. The exercise should increase your heart rate and make you sweat (moderate-intensity exercise).  Most adults should also do strengthening exercises at least twice a week. This is in addition to the moderate-intensity exercise. Maintain a healthy weight  Body mass index (BMI) is a measurement that can be used to identify possible weight problems. It estimates body fat based on height and weight. Your health care provider can help determine your BMI and help you achieve or maintain a healthy weight.  For females 82 years of age and older:  A BMI below 18.5 is considered underweight.  A BMI of 18.5 to 24.9 is normal.  A BMI of 25 to 29.9 is considered overweight.  A BMI of 30 and above is considered obese. Watch levels of cholesterol and blood lipids  You should start having your blood tested for lipids and cholesterol at 74 years of age, then have this test every 5 years.  You  may need to have your cholesterol levels checked more often if:  Your lipid or cholesterol levels are high.  You are older than 74 years of age.  You are at high risk for heart disease. Cancer screening Lung Cancer  Lung cancer screening is recommended for adults 1-45 years old who are at high risk for lung cancer because of a history of smoking.  A yearly low-dose CT scan of the lungs is recommended for people who:  Currently smoke.  Have quit within the past 15 years.  Have at least a 30-pack-year history of smoking. A pack year is smoking an average of one pack of cigarettes a day for 1 year.  Yearly screening should continue until it has been 15 years since you quit.  Yearly screening should stop if you develop a health problem that would prevent you from having lung cancer treatment. Breast Cancer  Practice breast self-awareness. This means understanding how your breasts normally appear and feel.  It also means doing regular breast self-exams. Let your health care provider know about any changes, no matter how small.  If you are in your 20s or 30s, you should have a clinical breast exam (CBE) by a health care provider every 1-3 years as part of a regular health exam.  If you are 86 or older, have a CBE every year. Also consider having a breast X-ray (mammogram) every year.  If you have a family history of breast cancer, talk to your health  care provider about genetic screening.  If you are at high risk for breast cancer, talk to your health care provider about having an MRI and a mammogram every year.  Breast cancer gene (BRCA) assessment is recommended for women who have family members with BRCA-related cancers. BRCA-related cancers include:  Breast.  Ovarian.  Tubal.  Peritoneal cancers.  Results of the assessment will determine the need for genetic counseling and BRCA1 and BRCA2 testing. Cervical Cancer  Your health care provider may recommend that you be  screened regularly for cancer of the pelvic organs (ovaries, uterus, and vagina). This screening involves a pelvic examination, including checking for microscopic changes to the surface of your cervix (Pap test). You may be encouraged to have this screening done every 3 years, beginning at age 27.  For women ages 18-65, health care providers may recommend pelvic exams and Pap testing every 3 years, or they may recommend the Pap and pelvic exam, combined with testing for human papilloma virus (HPV), every 5 years. Some types of HPV increase your risk of cervical cancer. Testing for HPV may also be done on women of any age with unclear Pap test results.  Other health care providers may not recommend any screening for nonpregnant women who are considered low risk for pelvic cancer and who do not have symptoms. Ask your health care provider if a screening pelvic exam is right for you.  If you have had past treatment for cervical cancer or a condition that could lead to cancer, you need Pap tests and screening for cancer for at least 20 years after your treatment. If Pap tests have been discontinued, your risk factors (such as having a new sexual partner) need to be reassessed to determine if screening should resume. Some women have medical problems that increase the chance of getting cervical cancer. In these cases, your health care provider may recommend more frequent screening and Pap tests. Colorectal Cancer  This type of cancer can be detected and often prevented.  Routine colorectal cancer screening usually begins at 74 years of age and continues through 74 years of age.  Your health care provider may recommend screening at an earlier age if you have risk factors for colon cancer.  Your health care provider may also recommend using home test kits to check for hidden blood in the stool.  A small camera at the end of a tube can be used to examine your colon directly (sigmoidoscopy or colonoscopy).  This is done to check for the earliest forms of colorectal cancer.  Routine screening usually begins at age 55.  Direct examination of the colon should be repeated every 5-10 years through 74 years of age. However, you may need to be screened more often if early forms of precancerous polyps or small growths are found. Skin Cancer  Check your skin from head to toe regularly.  Tell your health care provider about any new moles or changes in moles, especially if there is a change in a mole's shape or color.  Also tell your health care provider if you have a mole that is larger than the size of a pencil eraser.  Always use sunscreen. Apply sunscreen liberally and repeatedly throughout the day.  Protect yourself by wearing long sleeves, pants, a wide-brimmed hat, and sunglasses whenever you are outside. Heart disease, diabetes, and high blood pressure  High blood pressure causes heart disease and increases the risk of stroke. High blood pressure is more likely to develop in:  People  who have blood pressure in the high end of the normal range (130-139/85-89 mm Hg).  People who are overweight or obese.  People who are African American.  If you are 36-52 years of age, have your blood pressure checked every 3-5 years. If you are 1 years of age or older, have your blood pressure checked every year. You should have your blood pressure measured twice-once when you are at a hospital or clinic, and once when you are not at a hospital or clinic. Record the average of the two measurements. To check your blood pressure when you are not at a hospital or clinic, you can use:  An automated blood pressure machine at a pharmacy.  A home blood pressure monitor.  If you are between 53 years and 1 years old, ask your health care provider if you should take aspirin to prevent strokes.  Have regular diabetes screenings. This involves taking a blood sample to check your fasting blood sugar level.  If you  are at a normal weight and have a low risk for diabetes, have this test once every three years after 74 years of age.  If you are overweight and have a high risk for diabetes, consider being tested at a younger age or more often. Preventing infection Hepatitis B  If you have a higher risk for hepatitis B, you should be screened for this virus. You are considered at high risk for hepatitis B if:  You were born in a country where hepatitis B is common. Ask your health care provider which countries are considered high risk.  Your parents were born in a high-risk country, and you have not been immunized against hepatitis B (hepatitis B vaccine).  You have HIV or AIDS.  You use needles to inject street drugs.  You live with someone who has hepatitis B.  You have had sex with someone who has hepatitis B.  You get hemodialysis treatment.  You take certain medicines for conditions, including cancer, organ transplantation, and autoimmune conditions. Hepatitis C  Blood testing is recommended for:  Everyone born from [birth year] through 1964.  Anyone with known risk factors for hepatitis C. Sexually transmitted infections (STIs)  You should be screened for sexually transmitted infections (STIs) including gonorrhea and chlamydia if:  You are sexually active and are younger than 74 years of age.  You are older than 74 years of age and your health care provider tells you that you are at risk for this type of infection.  Your sexual activity has changed since you were last screened and you are at an increased risk for chlamydia or gonorrhea. Ask your health care provider if you are at risk.  If you do not have HIV, but are at risk, it may be recommended that you take a prescription medicine daily to prevent HIV infection. This is called pre-exposure prophylaxis (PrEP). You are considered at risk if:  You are sexually active and do not regularly use condoms or know the HIV status of your  partner(s).  You take drugs by injection.  You are sexually active with a partner who has HIV. Talk with your health care provider about whether you are at high risk of being infected with HIV. If you choose to begin PrEP, you should first be tested for HIV. You should then be tested every 3 months for as long as you are taking PrEP. Pregnancy  If you are premenopausal and you may become pregnant, ask your health care provider about preconception  counseling.  If you may become pregnant, take 400 to 800 micrograms (mcg) of folic acid every day.  If you want to prevent pregnancy, talk to your health care provider about birth control (contraception). Osteoporosis and menopause  Osteoporosis is a disease in which the bones lose minerals and strength with aging. This can result in serious bone fractures. Your risk for osteoporosis can be identified using a bone density scan.  If you are 40 years of age or older, or if you are at risk for osteoporosis and fractures, ask your health care provider if you should be screened.  Ask your health care provider whether you should take a calcium or vitamin D supplement to lower your risk for osteoporosis.  Menopause may have certain physical symptoms and risks.  Hormone replacement therapy may reduce some of these symptoms and risks. Talk to your health care provider about whether hormone replacement therapy is right for you. Follow these instructions at home:  Schedule regular health, dental, and eye exams.  Stay current with your immunizations.  Do not use any tobacco products including cigarettes, chewing tobacco, or electronic cigarettes.  If you are pregnant, do not drink alcohol.  If you are breastfeeding, limit how much and how often you drink alcohol.  Limit alcohol intake to no more than 1 drink per day for nonpregnant women. One drink equals 12 ounces of beer, 5 ounces of wine, or 1 ounces of hard liquor.  Do not use street  drugs.  Do not share needles.  Ask your health care provider for help if you need support or information about quitting drugs.  Tell your health care provider if you often feel depressed.  Tell your health care provider if you have ever been abused or do not feel safe at home. This information is not intended to replace advice given to you by your health care provider. Make sure you discuss any questions you have with your health care provider. Document Released: 07/02/2011 Document Revised: 05/24/2016 Document Reviewed: 09/20/2015  2017 Elsevier

## 2016-12-25 ENCOUNTER — Other Ambulatory Visit: Payer: Self-pay | Admitting: Internal Medicine

## 2017-01-02 ENCOUNTER — Telehealth: Payer: Self-pay | Admitting: Internal Medicine

## 2017-01-02 ENCOUNTER — Ambulatory Visit (INDEPENDENT_AMBULATORY_CARE_PROVIDER_SITE_OTHER): Payer: Medicare Other | Admitting: Physician Assistant

## 2017-01-02 VITALS — BP 118/76 | HR 55 | Temp 98.0°F | Resp 16 | Ht 65.0 in | Wt 134.0 lb

## 2017-01-02 DIAGNOSIS — K219 Gastro-esophageal reflux disease without esophagitis: Secondary | ICD-10-CM | POA: Diagnosis not present

## 2017-01-02 DIAGNOSIS — R0789 Other chest pain: Secondary | ICD-10-CM

## 2017-01-02 DIAGNOSIS — Z853 Personal history of malignant neoplasm of breast: Secondary | ICD-10-CM | POA: Insufficient documentation

## 2017-01-02 MED ORDER — SUCRALFATE 1 GM/10ML PO SUSP: 1.0000 g | Freq: Three times a day (TID) | ORAL | 0 refills | Status: DC

## 2017-01-02 NOTE — Patient Instructions (Addendum)
Increase your prilosec 20 mg to 2 pills 2x/day for the next week Zantac 150mg  1 pill 2x/day  Try this for 2 days and if you are not better then please get the medication that I sent to the pharmacy   Gastroesophageal Reflux Disease, Adult Introduction Normally, food travels down the esophagus and stays in the stomach to be digested. If a person has gastroesophageal reflux disease (GERD), food and stomach acid move back up into the esophagus. When this happens, the esophagus becomes sore and swollen (inflamed). Over time, GERD can make small holes (ulcers) in the lining of the esophagus. Follow these instructions at home: Diet  Follow a diet as told by your doctor. You may need to avoid foods and drinks such as:  Coffee and tea (with or without caffeine).  Drinks that contain alcohol.  Energy drinks and sports drinks.  Carbonated drinks or sodas.  Chocolate and cocoa.  Peppermint and mint flavorings.  Garlic and onions.  Horseradish.  Spicy and acidic foods, such as peppers, chili powder, curry powder, vinegar, hot sauces, and BBQ sauce.  Citrus fruit juices and citrus fruits, such as oranges, lemons, and limes.  Tomato-based foods, such as red sauce, chili, salsa, and pizza with red sauce.  Fried and fatty foods, such as donuts, french fries, potato chips, and high-fat dressings.  High-fat meats, such as hot dogs, rib eye steak, sausage, ham, and bacon.  High-fat dairy items, such as whole milk, butter, and cream cheese.  Eat small meals often. Avoid eating large meals.  Avoid drinking large amounts of liquid with your meals.  Avoid eating meals during the 2-3 hours before bedtime.  Avoid lying down right after you eat.  Do not exercise right after you eat. General instructions  Pay attention to any changes in your symptoms.  Take over-the-counter and prescription medicines only as told by your doctor. Do not take aspirin, ibuprofen, or other NSAIDs unless your  doctor says it is okay.  Do not use any tobacco products, including cigarettes, chewing tobacco, and e-cigarettes. If you need help quitting, ask your doctor.  Wear loose clothes. Do not wear anything tight around your waist.  Raise (elevate) the head of your bed about 6 inches (15 cm).  Try to lower your stress. If you need help doing this, ask your doctor.  If you are overweight, lose an amount of weight that is healthy for you. Ask your doctor about a safe weight loss goal.  Keep all follow-up visits as told by your doctor. This is important. Contact a doctor if:  You have new symptoms.  You lose weight and you do not know why it is happening.  You have trouble swallowing, or it hurts to swallow.  You have wheezing or a cough that keeps happening.  Your symptoms do not get better with treatment.  You have a hoarse voice. Get help right away if:  You have pain in your arms, neck, jaw, teeth, or back.  You feel sweaty, dizzy, or light-headed.  You have chest pain or shortness of breath.  You throw up (vomit) and your throw up looks like blood or coffee grounds.  You pass out (faint).  Your poop (stool) is bloody or black.  You cannot swallow, drink, or eat. This information is not intended to replace advice given to you by your health care provider. Make sure you discuss any questions you have with your health care provider. Document Released: 06/04/2008 Document Revised: 05/24/2016 Document Reviewed: 04/13/2015  2017 Elsevier     IF you received an x-ray today, you will receive an invoice from [facility]. Please contact [facility] at [phone] with questions or concerns regarding your invoice.   IF you received labwork today, you will receive an invoice from [facility]. Please contact [facility] at [phone] with questions or concerns regarding your invoice.   Our billing staff will not be able to assist you with questions regarding bills  from these companies.  You will be contacted with the lab results as soon as they are available. The fastest way to get your results is to activate your My Chart account. Instructions are located on the last page of this paperwork. If you have not heard from [place] regarding the results in 2 weeks, please contact this office.

## 2017-01-02 NOTE — Progress Notes (Signed)
 Wanda Rivera  MRN: [id number] DOB: 06/16/1941  Subjective:  Pt presents to clinic with 3-4 day h/o chest discomfort that is related to eating..  When she ate she started to have increased discomfort - she would drink gingerale and burp and she would feel better.  She has h/o acid reflux and uses omeprazole.  She has been using zantac in addition and that would help sometimes in the past but does not seem to be helping as much the last several days.  Pt has not changed what she has been eating recently but she did get a new coffee maker and switched to caffeinated coffee a few days before the pain started - she has since stopped this because someone told her it could make her heartburn worse.   Feels like pressure and when she burps she feels slightly better.  Rare glass of red wine but when she did drink it 2 nights ago she did have this pain afterwards.  Dr Gangi - stress test and echo - Spring 2017 -   Review of Systems  Constitutional: Negative for chills and fever.  Respiratory: Negative for chest tightness, shortness of breath and wheezing.   Cardiovascular: Positive for chest pain (discomfort).  Gastrointestinal: Negative for constipation and nausea.    Patient Active Problem List   Diagnosis Date Noted  . History of breast cancer 01/02/2017  . Compression fracture of L3 lumbar vertebra (HCC) 05/04/2016  . Lumbar back pain 10/07/2015  . Hyperlipidemia 08/16/2015  . Left carotid bruit 08/16/2015  . Atherosclerotic peripheral vascular disease (HCC) 08/06/2015  . LLQ pain 07/08/2015  . Routine general medical examination at a health care facility 12/09/2014  . Osteopenia 12/09/2014  . Glaucoma 12/09/2014    Current Outpatient Prescriptions on File Prior to Visit  Medication Sig Dispense Refill  . acetaminophen (TYLENOL) 500 MG tablet Take 500 mg by mouth as needed for moderate pain or headache.     . bimatoprost (LUMIGAN) 0.03 % ophthalmic solution Place 1 drop into both  eyes at bedtime.     . BIOTIN PO Take 1,000 mcg by mouth 2 (two) times daily.     . Calcium Carb-Cholecalciferol (CALCIUM + D3) 600-200 MG-UNIT TABS Take 1 tablet by mouth 2 (two) times daily.     . Cholecalciferol (VITAMIN D3) 400 UNITS CAPS Take 400 Units by mouth daily.     . Cyanocobalamin (VITAMIN B 12 PO) Take 500 mg by mouth daily.    . DIGESTIVE ENZYMES PO Take 1 capsule by mouth daily.     . dorzolamide-timolol (COSOPT) 22.3-6.8 MG/ML ophthalmic solution Place 1 drop into both eyes 2 (two) times daily.     . meclizine (ANTIVERT) 12.5 MG tablet Take 12.5 mg by mouth 3 (three) times daily as needed for dizziness.     . Multiple Vitamin (ONE-A-DAY ESSENTIAL PO) Take by mouth daily.    . Omega-3 Fat Ac-Cholecalciferol (OMEGA ESSENTIALS/VIT D3) LIQD Take 5 mLs by mouth daily.     . omeprazole (PRILOSEC) 20 MG capsule TAKE 1 CAPSULE TWICE DAILY 180 capsule 3  . Turmeric 500 MG TABS Take by mouth daily.     No current facility-administered medications on file prior to visit.     Allergies  Allergen Reactions  . Darvon [Propoxyphene]     Darvocet-Makes her "crazy"  . Morphine And Related     Makes her "crazy"  . Percocet [Oxycodone-Acetaminophen]     Makes her "crazy"  . Vicodin [Hydrocodone-Acetaminophen]       Makes her "crazy"    Pt patients past, family and social history were reviewed and updated.   Objective:  BP 118/76 (BP Location: Right Arm, Patient Position: Sitting, Cuff Size: Normal)   Pulse (!) 55   Temp 98 F (36.7 C) (Oral)   Resp 16   Ht 5' 5" (1.651 m)   Wt 134 lb (60.8 kg)   SpO2 100%   BMI 22.30 kg/m   Physical Exam  Constitutional: She is oriented to person, place, and time and well-developed, well-nourished, and in no distress.  HENT:  Head: Normocephalic and atraumatic.  Right Ear: Hearing and external ear normal.  Left Ear: Hearing and external ear normal.  Eyes: Conjunctivae are normal.  Neck: Normal range of motion.  Cardiovascular: Normal  rate, regular rhythm and normal heart sounds.   No murmur heard. Pulmonary/Chest: Effort normal and breath sounds normal. She has no wheezes.  No reproducible pain.  Abdominal: Soft. There is tenderness (epigastric - mild).  Neurological: She is alert and oriented to person, place, and time. Gait normal.  Skin: Skin is warm and dry.  Psychiatric: Mood, memory, affect and judgment normal.  Vitals reviewed.  ECG - bradycardia - no acute changes - read with Dr Mingo Amber Assessment and Plan :  Chest pressure - Plan: CMP14+EGFR, EKG 12-Lead  Gastroesophageal reflux disease without esophagitis - Plan: sucralfate (CARAFATE) 1 GM/10ML suspension  Pt has stopped the coffee which I suspect might have triggers this - she will increase her PPI for 1 week and add daily Zantac over the next week - in 2 days if no improvement she will start carafate which I have sent to the pharmacy for her today.  She will eat bland foods and foods that increase reflux and gastritis were given to her to avoid over the next several days.  D/w Dr Andi Hence PA-C  [facility] 01/02/2017 3:01 PM

## 2017-01-02 NOTE — Telephone Encounter (Signed)
Patient Name: Wanda Rivera  DOB: Sep 17, 1941    Initial Comment Caller states having feeling in chest pressure like someone sitting on her chest; unable to sleep past 2 nights; indigestion, acid reflux-takes zantac which helps a bit; ;    Nurse Assessment  Nurse: Raphael Gibney, RN, Vera Date/Time (Eastern Time): 01/02/2017 9:31:43 AM  Confirm and document reason for call. If symptomatic, describe symptoms. ---Caller states she has acid reflux and takes medication for that. She is having chest pressure that started a few days ago. Pain is in the center of her chest. Feels like a heavy feeling or that someone is sitting on her chest. Zantac helps the pain at times.  Does the patient have any new or worsening symptoms? ---Yes  Will a triage be completed? ---Yes  Related visit to physician within the last 2 weeks? ---No  Does the PT have any chronic conditions? (i.e. diabetes, asthma, etc.) ---Yes  List chronic conditions. ---acid reflux; breast cancer survivor  Is this a behavioral health or substance abuse call? ---No     Guidelines    Guideline Title Affirmed Question Affirmed Notes  Chest Pain [1] Chest pain lasts > 5 minutes AND [2] described as crushing, pressure-like, or heavy    Final Disposition User   Call EMS 911 Now Raphael Gibney, RN, Vanita Ingles    Comments  Pt does not want to call 911 or go to the ER. States she will go to the urgent care and they will send to the ER if needed.   Referrals  [facility]   Disagree/Comply: Disagree  Disagree/Comply Reason: Disagree with instructions

## 2017-01-03 LAB — CMP14+EGFR
A/G RATIO: 2.1 (ref 1.2–2.2)
ALK PHOS: 105 IU/L (ref 39–117)
ALT: 14 IU/L (ref 0–32)
AST: 21 IU/L (ref 0–40)
Albumin: 4.4 g/dL (ref 3.5–4.8)
BILIRUBIN TOTAL: 0.5 mg/dL (ref 0.0–1.2)
BUN/Creatinine Ratio: 16 (ref 12–28)
BUN: 15 mg/dL (ref 8–27)
CHLORIDE: 101 mmol/L (ref 96–106)
CO2: 27 mmol/L (ref 18–29)
Calcium: 9.5 mg/dL (ref 8.7–10.3)
Creatinine, Ser: 0.94 mg/dL (ref 0.57–1.00)
GFR calc non Af Amer: 60 mL/min/{1.73_m2} (ref >59)
GFR, EST AFRICAN AMERICAN: 69 mL/min/{1.73_m2} (ref >59)
Globulin, Total: 2.1 g/dL (ref 1.5–4.5)
Glucose: 84 mg/dL (ref 65–99)
POTASSIUM: 4.4 mmol/L (ref 3.5–5.2)
Sodium: 142 mmol/L (ref 134–144)
TOTAL PROTEIN: 6.5 g/dL (ref 6.0–8.5)

## 2017-01-05 ENCOUNTER — Telehealth: Payer: Self-pay

## 2017-01-05 NOTE — Telephone Encounter (Signed)
Pt called in - states Carafate liquid not covered by ins and is too expensive. [facility] pharmacy Carafate liquid (no generic available) is approx $230  Pharm suggested pills (generic available) 4 tabs per day (each tab=1g) $25.30 Please advise

## 2017-01-10 ENCOUNTER — Ambulatory Visit (INDEPENDENT_AMBULATORY_CARE_PROVIDER_SITE_OTHER): Payer: Medicare Other

## 2017-01-10 ENCOUNTER — Ambulatory Visit (INDEPENDENT_AMBULATORY_CARE_PROVIDER_SITE_OTHER): Payer: Medicare Other | Admitting: Physician Assistant

## 2017-01-10 VITALS — BP 112/68 | HR 67 | Temp 97.6°F | Resp 16 | Ht 64.0 in | Wt 132.8 lb

## 2017-01-10 DIAGNOSIS — R0781 Pleurodynia: Secondary | ICD-10-CM

## 2017-01-10 DIAGNOSIS — R1013 Epigastric pain: Secondary | ICD-10-CM

## 2017-01-10 LAB — POCT CBC
Granulocyte percent: 66.7 %G (ref 37–80)
HCT, POC: 39.9 % (ref 37.7–47.9)
Hemoglobin: 13.9 g/dL (ref 12.2–16.2)
Lymph, poc: 1.8 (ref 0.6–3.4)
MCH: 32.2 pg — AB (ref 27–31.2)
MCHC: 34.8 g/dL (ref 31.8–35.4)
MCV: 92.6 fL (ref 80–97)
MID (cbc): 0.2 (ref 0–0.9)
MPV: 8.3 fL (ref 0–99.8)
POC Granulocyte: 3.9 (ref 2–6.9)
POC LYMPH PERCENT: 30.5 %L (ref 10–50)
POC MID %: 2.8 % (ref 0–12)
Platelet Count, POC: 146 10*3/uL (ref 142–424)
RBC: 4.31 M/uL (ref 4.04–5.48)
RDW, POC: 13.4 %
WBC: 5.8 10*3/uL (ref 4.6–10.2)

## 2017-01-10 LAB — POCT URINALYSIS DIP (MANUAL ENTRY)
BILIRUBIN UA: NEGATIVE
BILIRUBIN UA: NEGATIVE
Blood, UA: NEGATIVE
Glucose, UA: NEGATIVE
LEUKOCYTES UA: NEGATIVE
Nitrite, UA: NEGATIVE
PH UA: 6
PROTEIN UA: NEGATIVE
SPEC GRAV UA: 1.01
Urobilinogen, UA: 0.2

## 2017-01-10 LAB — POC MICROSCOPIC URINALYSIS (UMFC): MUCUS RE: ABSENT

## 2017-01-10 MED ORDER — SUCRALFATE 1 G PO TABS: 1.0000 g | ORAL_TABLET | Freq: Three times a day (TID) | ORAL | 0 refills | Status: DC

## 2017-01-10 NOTE — Patient Instructions (Addendum)
The lab tests are all negative/normal. The ribs and your lungs are normal.  I have sent the Carafate tablets to the pharmacy. If you don't have improvement with the addition of the Carafate, I recommend GI evaluation.     IF you received an x-ray today, you will receive an invoice from [facility]. Please contact [facility] at [phone] with questions or concerns regarding your invoice.   IF you received labwork today, you will receive an invoice from [facility]. Please contact [facility] at [phone] with questions or concerns regarding your invoice.   Our billing staff will not be able to assist you with questions regarding bills from these companies.  You will be contacted with the lab results as soon as they are available. The fastest way to get your results is to activate your My Chart account. Instructions are located on the last page of this paperwork. If you have not heard from [place] regarding the results in 2 weeks, please contact this office.

## 2017-01-10 NOTE — Progress Notes (Signed)
Patient ID: Wanda Rivera, female    DOB: 26-Apr-1941, 76 y.o.   MRN: [id number]  PCP: Hoyt Koch, MD  Chief Complaint  Patient presents with  . Abdominal Pain    burning sensation, pain in left side, pt was seen on 01/02/17    Subjective:   Presents for evaluation of persistent epigastric abdominal pain and LEFT sided rib pain.  She was seen here on 01/02/2017 with chest pain associated with eating.  EKG was normal. CMET was normal. It was thought that she was experiencing increased reflux-type symptoms and advised to increase the PPI and add H2 blocker, and add carafate if the symptoms were not well controlled.  There was an issue with the cost of carafate suspension, and a request was made that it be changed to tablets, but she has not received them.  Stress test and Echo with Dr. Einar Gip in the spring. Mother died of metastatic breast cancer. One niece diagnosed with breast cancer and tested positive for the BRCA mutation (unclear if 1 or 2 or both) and her sister and their mother (the patient's sister) also tested positive.  The patient is concerned that her pain represents a breast cancer recurrence.  Has been following the recommended bland diet. She notes that her weight is 2 lbs down from her visit 01/02/2017.  No melena, hematochezia. Vertigo is baseline. No urinary symptoms.  No falls or injury or activity to explain the rib pain.  Review of Systems As above.    Patient Active Problem List   Diagnosis Date Noted  . History of breast cancer 01/02/2017  . Compression fracture of L3 lumbar vertebra ([place]) 05/04/2016  . Lumbar back pain 10/07/2015  . Hyperlipidemia 08/16/2015  . Left carotid bruit 08/16/2015  . Atherosclerotic peripheral vascular disease ([place]) 08/06/2015  . LLQ pain 07/08/2015  . Routine general medical examination at a health care facility 12/09/2014  . Osteopenia 12/09/2014  . Glaucoma 12/09/2014     Prior to Admission  medications   Medication Sig Start Date End Date Taking? Authorizing Provider  acetaminophen (TYLENOL) 500 MG tablet Take 500 mg by mouth as needed for moderate pain or headache.    Yes Historical Provider, MD  bimatoprost (LUMIGAN) 0.03 % ophthalmic solution Place 1 drop into both eyes at bedtime.    Yes Historical Provider, MD  BIOTIN PO Take 1,000 mcg by mouth 2 (two) times daily.    Yes Historical Provider, MD  brimonidine (ALPHAGAN) 0.2 % ophthalmic solution  12/27/16  Yes Historical Provider, MD  Calcium Carb-Cholecalciferol (CALCIUM + D3) 600-200 MG-UNIT TABS Take 1 tablet by mouth 2 (two) times daily.    Yes Historical Provider, MD  Cholecalciferol (VITAMIN D3) 400 UNITS CAPS Take 400 Units by mouth daily.    Yes Historical Provider, MD  Cyanocobalamin (VITAMIN B 12 PO) Take 500 mg by mouth daily.   Yes Historical Provider, MD  DIGESTIVE ENZYMES PO Take 1 capsule by mouth daily.    Yes Historical Provider, MD  dorzolamide-timolol (COSOPT) 22.3-6.8 MG/ML ophthalmic solution Place 1 drop into both eyes 2 (two) times daily.  12/03/14  Yes Historical Provider, MD  meclizine (ANTIVERT) 12.5 MG tablet Take 12.5 mg by mouth 3 (three) times daily as needed for dizziness.    Yes Historical Provider, MD  Multiple Vitamin (ONE-A-DAY ESSENTIAL PO) Take by mouth daily.   Yes Historical Provider, MD  Omega-3 Fat Ac-Cholecalciferol (OMEGA ESSENTIALS/VIT D3) LIQD Take 5 mLs by mouth daily.    Yes  Historical Provider, MD  omeprazole (PRILOSEC) 20 MG capsule TAKE 1 CAPSULE TWICE DAILY 12/26/16  Yes Hoyt Koch, MD  Turmeric 500 MG TABS Take by mouth daily.   Yes Historical Provider, MD  sucralfate (CARAFATE) 1 GM/10ML suspension Take 10 mLs (1 g total) by mouth 4 (four) times daily -  with meals and at bedtime. Patient not taking: Reported on 01/10/2017 01/02/17   Mancel Bale, PA-C     Allergies  Allergen Reactions  . Darvon [Propoxyphene]     Darvocet-Makes her "crazy"  . Morphine And Related      Makes her "crazy"  . Percocet [Oxycodone-Acetaminophen]     Makes her "crazy"  . Vicodin [Hydrocodone-Acetaminophen]     Makes her "crazy"       Objective:  Physical Exam  Constitutional: She is oriented to person, place, and time. She appears well-developed and well-nourished. She is active and cooperative. No distress.  BP 112/68   Pulse 67   Temp 97.6 F (36.4 C) (Oral)   Resp 16   Ht _0  (1.626 m)   Wt 132 lb 12.8 oz (60.2 kg)   SpO2 99%   BMI 22.80 kg/m   HENT:  Head: Normocephalic and atraumatic.  Right Ear: Hearing normal.  Left Ear: Hearing normal.  Eyes: Conjunctivae are normal. No scleral icterus.  Neck: Normal range of motion. Neck supple. No thyromegaly present.  Cardiovascular: Normal rate, regular rhythm and normal heart sounds.   Pulses:      Radial pulses are 2+ on the right side, and 2+ on the left side.  Pulmonary/Chest: Effort normal and breath sounds normal.    Bilateral mastectomies  Abdominal: Normal appearance and bowel sounds are normal. She exhibits no distension and no mass. There is no hepatosplenomegaly. There is tenderness in the epigastric area. There is no rigidity, no rebound and no guarding.  Lymphadenopathy:       Head (right side): No tonsillar, no preauricular, no posterior auricular and no occipital adenopathy present.       Head (left side): No tonsillar, no preauricular, no posterior auricular and no occipital adenopathy present.    She has no cervical adenopathy.       Right: No supraclavicular adenopathy present.       Left: No supraclavicular adenopathy present.  Neurological: She is alert and oriented to person, place, and time. No sensory deficit.  Skin: Skin is warm, dry and intact. No rash noted. No cyanosis or erythema. Nails show no clubbing.  Psychiatric: She has a normal mood and affect. Her speech is normal and behavior is normal.    Wt Readings from Last 3 Encounters:  01/10/17 132 lb 12.8 oz (60.2 kg)  01/02/17  134 lb (60.8 kg)  12/10/16 133 lb (60.3 kg)   Results for orders placed or performed in visit on 01/10/17  POCT urinalysis dipstick  Result Value Ref Range   Color, UA yellow yellow   Clarity, UA clear clear   Glucose, UA negative negative   Bilirubin, UA negative negative   Ketones, POC UA negative negative   Spec Grav, UA 1.010    Blood, UA negative negative   pH, UA 6.0    Protein Ur, POC negative negative   Urobilinogen, UA 0.2    Nitrite, UA Negative Negative   Leukocytes, UA Negative Negative  POCT Microscopic Urinalysis ([facility])  Result Value Ref Range   WBC,UR,HPF,POC None None WBC/hpf   RBC,UR,HPF,POC None None RBC/hpf   Bacteria None None,  Too numerous to count   Mucus Absent Absent   Epithelial Cells, UR Per Microscopy Few (A) None, Too numerous to count cells/hpf  POCT CBC  Result Value Ref Range   WBC 5.8 4.6 - 10.2 K/uL   Lymph, poc 1.8 0.6 - 3.4   POC LYMPH PERCENT 30.5 10 - 50 %L   MID (cbc) 0.2 0 - 0.9   POC MID % 2.8 0 - 12 %M   POC Granulocyte 3.9 2 - 6.9   Granulocyte percent 66.7 37 - 80 %G   RBC 4.31 4.04 - 5.48 M/uL   Hemoglobin 13.9 12.2 - 16.2 g/dL   HCT, POC 39.9 37.7 - 47.9 %   MCV 92.6 80 - 97 fL   MCH, POC 32.2 (A) 27 - 31.2 pg   MCHC 34.8 31.8 - 35.4 g/dL   RDW, POC 13.4 %   Platelet Count, POC 146 142 - 424 K/uL   MPV 8.3 0 - 99.8 fL    Dg Ribs Unilateral W/chest Left  Result Date: 01/10/2017 CLINICAL DATA:  Left rib pain for 5 days, no injury EXAM: LEFT RIBS AND CHEST - 3+ VIEW COMPARISON:  Chest x-ray of 03/20/2016 FINDINGS: The lungs remain hyperaerated but no infiltrate or effusion is seen. No pneumothorax is noted. Mediastinal and hilar contours are unremarkable. The heart is within upper limits of normal. Surgical clips overlie the both upper hemithoraces. The left rib detail films are somewhat grainy resulting in poor resolution of the lower ribs. However no acute fracture is evident. IMPRESSION: 1. Hyperaeration.  No active lung  disease. 2. Negative left rib detail images as noted above. Electronically Signed   By: Ivar Drape M.D.   On: 01/10/2017 11:16       Assessment & Plan:   1. Epigastric pain Suspect gastritis/GERD. Continue PPI, H2 blocker and diet to reduce reflux symptoms. Sent sucralfate tablets to the pharmacy. If symptoms persist, would refer to GI. - POCT urinalysis dipstick - POCT Microscopic Urinalysis ([facility]) - POCT CBC  2. Rib pain on left side No lytic lesions. Reassured that I do not find any suggestion of recurrent cancer. Rest. Hydrate and see PCP if symptoms worsen/persist. - DG Ribs Unilateral W/Chest Left; Future   Fara Chute, PA-C Physician Assistant-Certified Primary Care at [place]

## 2017-01-10 NOTE — Addendum Note (Signed)
Addended by: Fara Chute on: 01/10/2017 11:25 AM   Modules accepted: Orders

## 2017-01-10 NOTE — Telephone Encounter (Signed)
Pt was seen today.

## 2017-02-07 ENCOUNTER — Ambulatory Visit (INDEPENDENT_AMBULATORY_CARE_PROVIDER_SITE_OTHER): Payer: Medicare Other | Admitting: Physician Assistant

## 2017-02-07 VITALS — BP 108/68 | HR 68 | Temp 97.4°F | Resp 16 | Ht 64.0 in | Wt 133.2 lb

## 2017-02-07 DIAGNOSIS — K219 Gastro-esophageal reflux disease without esophagitis: Secondary | ICD-10-CM | POA: Diagnosis not present

## 2017-02-07 DIAGNOSIS — R1013 Epigastric pain: Secondary | ICD-10-CM

## 2017-02-07 NOTE — Progress Notes (Signed)
Patient ID: Wanda Rivera, female    DOB: 21-Mar-1941, 76 y.o.   MRN: [id number]  PCP: Hoyt Koch, MD  Chief Complaint  Patient presents with  . Follow-up    recheck acid reflux     Subjective:   Presents for evaluation of persistent reflux symptoms.  She has long-standing GERD. Her symptoms were totally controlled on daily PPI therapy  until Christmas, when she began drinking caffeinated coffee, having received a coffee maker for Christmas. Prior to that she drank only decaf.  She presented here on 01/02/2017 with chest pain associated with eating, that she thought was GERD, but worried her. She was specifically concerned that it might represent a recurrence of breast cancer. She had eliminated the caffeine, and was advised to follow a bland diet and ranitidine 150 mg BID was added to her regimen.  She returned on 01/21/2017 with no improvement, and with epigastric pain radiating to the LUQ. UA, CMET, CBC with differential and CXR were all normal. Carafate was added.  She reports continued epigastric pain. Carafate seemed to help some, but temporarily. She has completely eliminated acidic, fried, fatty food/beverages, and most spices. She is eating yogurt, bland breads and non-acidic soup. Despite this, her symptoms persist and she's feeling discouraged.  She remains concerned that her pain represents something more ominous.  No fever, chills, nausea, vomiting. No diarrhea, constipation, melena or hematochezia. No weight loss.    Review of Systems As above.    Patient Active Problem List   Diagnosis Date Noted  . History of breast cancer 01/02/2017  . Compression fracture of L3 lumbar vertebra ([place]) 05/04/2016  . Lumbar back pain 10/07/2015  . Hyperlipidemia 08/16/2015  . Left carotid bruit 08/16/2015  . Atherosclerotic peripheral vascular disease ([place]) 08/06/2015  . LLQ pain 07/08/2015  . Routine general medical examination at a health care facility  12/09/2014  . Osteopenia 12/09/2014  . Glaucoma 12/09/2014     Prior to Admission medications   Medication Sig Start Date End Date Taking? Authorizing Provider  acetaminophen (TYLENOL) 500 MG tablet Take 500 mg by mouth as needed for moderate pain or headache.    Yes Historical Provider, MD  bimatoprost (LUMIGAN) 0.03 % ophthalmic solution Place 1 drop into both eyes at bedtime.    Yes Historical Provider, MD  BIOTIN PO Take 1,000 mcg by mouth 2 (two) times daily.    Yes Historical Provider, MD  brimonidine (ALPHAGAN) 0.2 % ophthalmic solution  12/27/16  Yes Historical Provider, MD  Calcium Carb-Cholecalciferol (CALCIUM + D3) 600-200 MG-UNIT TABS Take 1 tablet by mouth 2 (two) times daily.    Yes Historical Provider, MD  Cholecalciferol (VITAMIN D3) 400 UNITS CAPS Take 400 Units by mouth daily.    Yes Historical Provider, MD  Cyanocobalamin (VITAMIN B 12 PO) Take 500 mg by mouth daily.   Yes Historical Provider, MD  DIGESTIVE ENZYMES PO Take 1 capsule by mouth daily.    Yes Historical Provider, MD  dorzolamide-timolol (COSOPT) 22.3-6.8 MG/ML ophthalmic solution Place 1 drop into both eyes 2 (two) times daily.  12/03/14  Yes Historical Provider, MD  meclizine (ANTIVERT) 12.5 MG tablet Take 12.5 mg by mouth 3 (three) times daily as needed for dizziness.    Yes Historical Provider, MD  Multiple Vitamin (ONE-A-DAY ESSENTIAL PO) Take by mouth daily.   Yes Historical Provider, MD  Omega-3 Fat Ac-Cholecalciferol (OMEGA ESSENTIALS/VIT D3) LIQD Take 5 mLs by mouth daily.    Yes Historical Provider, MD  omeprazole (  PRILOSEC) 20 MG capsule TAKE 1 CAPSULE TWICE DAILY 12/26/16  Yes Hoyt Koch, MD  Turmeric 500 MG TABS Take by mouth daily.   Yes Historical Provider, MD  sucralfate (CARAFATE) 1 g tablet Take 1 tablet (1 g total) by mouth 4 (four) times daily -  with meals and at bedtime. Patient not taking: Reported on 02/07/2017 01/10/17   Harrison Mons, PA-C     Allergies  Allergen Reactions    . Darvon [Propoxyphene]     Darvocet-Makes her "crazy"  . Morphine And Related     Makes her "crazy"  . Percocet [Oxycodone-Acetaminophen]     Makes her "crazy"  . Vicodin [Hydrocodone-Acetaminophen]     Makes her "crazy"       Objective:  Physical Exam  Constitutional: She is oriented to person, place, and time. She appears well-developed and well-nourished. She is active and cooperative. No distress.  BP 108/68 (BP Location: Right Arm, Patient Position: Sitting, Cuff Size: Small)   Pulse 68   Temp 97.4 F (36.3 C) (Oral)   Resp 16   Ht 5\' 4"  (1.626 m)   Wt 133 lb 3.2 oz (60.4 kg)   SpO2 99%   BMI 22.86 kg/m    Eyes: Conjunctivae are normal.  Pulmonary/Chest: Effort normal.  Neurological: She is alert and oriented to person, place, and time.  Psychiatric: She has a normal mood and affect. Her speech is normal and behavior is normal.           Assessment & Plan:   1. Epigastric pain 2. Gastroesophageal reflux disease without esophagitis Proceed with GI evaluation. As she has had no improvement with elimination of GERD triggers, advised that she may re-introduce the foods that she loves one at a time. If something aggravates her symptoms, she'll eliminate it again. Continue one the omeprazole and ranitidine. - Ambulatory referral to Gastroenterology  Fara Chute, PA-C Physician Assistant-Certified Primary Care at [place]

## 2017-02-07 NOTE — Progress Notes (Signed)
Patient ID: Wanda Rivera, female    DOB: 1941/01/31, 76 y.o.   MRN: [id number]  PCP: Hoyt Koch, MD  Chief Complaint  Patient presents with  . Follow-up    recheck acid reflux     Subjective:   Presents for evaluation of abdominal pain.  Pt is a 76 yo AA female with a long-standing history of GERD who present with continuing abdominal pain. This problem began after she got a coffee maker for christmas and switched from decaf to regular coffee. She was seen here on 01/02/2017 with chest pain associated with eating that was believed to be due to reflux symptoms. She was advised to increase the PPI and add an H2 blocker. She was also advised to follow a bland diet. She was then seen here on 01/10/2017 with epigastric pain radiating to the LUQ. Urinalysis, CMP, CBC, and chest xray were all within normal limits. Pt was advised to continue PPI, H2 Blocker, and diet, Carafate was added.   Today, pt appears with continuing epigastric discomfort. She describes as an "annoying ache". She states that the Carafate seemed to help while she took it and in the week following. She has following the instructions for a bland diet very strictly. She states that she only eats non-acidic soups, bland breads, and yogurts. She has avoided acidic foods, coffee, and most spices. She is very discouraged that she has seen no improvement by cutting out all of the food she loves.  Pt has been concerned about the possibility of the discomfort meaning something more serious like liver disease or recurrence of her breast cancer.  Denies nausea, vomiting, diarrhea, constipation, or hematochezia.  Review of Systems In addition to that stated in HPI above: Const: Denies fever, chills, fatigue, or weight loss.  Pulm: Denies cough or SOB. CV: Denies chest pain or palpitations.   Patient Active Problem List   Diagnosis Date Noted  . History of breast cancer 01/02/2017  . Compression fracture of L3 lumbar  vertebra ([place]) 05/04/2016  . Lumbar back pain 10/07/2015  . Hyperlipidemia 08/16/2015  . Left carotid bruit 08/16/2015  . Atherosclerotic peripheral vascular disease ([place]) 08/06/2015  . LLQ pain 07/08/2015  . Routine general medical examination at a health care facility 12/09/2014  . Osteopenia 12/09/2014  . Glaucoma 12/09/2014     Prior to Admission medications   Medication Sig Start Date End Date Taking? Authorizing Provider  acetaminophen (TYLENOL) 500 MG tablet Take 500 mg by mouth as needed for moderate pain or headache.    Yes Historical Provider, MD  bimatoprost (LUMIGAN) 0.03 % ophthalmic solution Place 1 drop into both eyes at bedtime.    Yes Historical Provider, MD  BIOTIN PO Take 1,000 mcg by mouth 2 (two) times daily.    Yes Historical Provider, MD  brimonidine (ALPHAGAN) 0.2 % ophthalmic solution  12/27/16  Yes Historical Provider, MD  Calcium Carb-Cholecalciferol (CALCIUM + D3) 600-200 MG-UNIT TABS Take 1 tablet by mouth 2 (two) times daily.    Yes Historical Provider, MD  Cholecalciferol (VITAMIN D3) 400 UNITS CAPS Take 400 Units by mouth daily.    Yes Historical Provider, MD  Cyanocobalamin (VITAMIN B 12 PO) Take 500 mg by mouth daily.   Yes Historical Provider, MD  DIGESTIVE ENZYMES PO Take 1 capsule by mouth daily.    Yes Historical Provider, MD  dorzolamide-timolol (COSOPT) 22.3-6.8 MG/ML ophthalmic solution Place 1 drop into both eyes 2 (two) times daily.  12/03/14  Yes Historical Provider, MD  meclizine (ANTIVERT) 12.5 MG tablet Take 12.5 mg by mouth 3 (three) times daily as needed for dizziness.    Yes Historical Provider, MD  Multiple Vitamin (ONE-A-DAY ESSENTIAL PO) Take by mouth daily.   Yes Historical Provider, MD  Omega-3 Fat Ac-Cholecalciferol (OMEGA ESSENTIALS/VIT D3) LIQD Take 5 mLs by mouth daily.    Yes Historical Provider, MD  omeprazole (PRILOSEC) 20 MG capsule TAKE 1 CAPSULE TWICE DAILY 12/26/16  Yes Hoyt Koch, MD  Turmeric 500 MG TABS Take by  mouth daily.   Yes Historical Provider, MD  sucralfate (CARAFATE) 1 g tablet Take 1 tablet (1 g total) by mouth 4 (four) times daily -  with meals and at bedtime. Patient not taking: Reported on 02/07/2017 01/10/17   Harrison Mons, PA-C     Allergies  Allergen Reactions  . Darvon [Propoxyphene]     Darvocet-Makes her "crazy"  . Morphine And Related     Makes her "crazy"  . Percocet [Oxycodone-Acetaminophen]     Makes her "crazy"  . Vicodin [Hydrocodone-Acetaminophen]     Makes her "crazy"       Objective:  Physical Exam HEENT: PERRLA. Throat is non-erythematous, no exudates. No lymphadenopathy or tenderness to palpation of neck.  Pulm: Good respiratory effort. CTAB. No wheezes, rales, or rhonchi. CV: RRR. No M/R/G. Abd: Soft, non-tender, non-distended. + BS x 4 quadrants. Psych: Somewhat tearful. Otherwise good affect.     Assessment & Plan:   1. Epigastric pain Symptoms seem unchanged despite diet and medical therapy. Pt is very anxious about the cause of her abdominal discomfort. She has followed every instruction given and misses the foods she used to love. Pt advised to continue Zantac and Omeprazole. Will send referral to GI. Pt advised to reintroduce foods that she has been cutting out one-by-one. If she experiences increased symptoms with a certain food, she should avoid that food.  - Ambulatory referral to Gastroenterology  2. Gastroesophageal reflux disease without esophagitis Pt advised to continue Zantac and Omeprazole.  - Ambulatory referral to Gastroenterology  Lorella Nimrod, PA-S

## 2017-02-07 NOTE — Patient Instructions (Addendum)
Given the lack of improvement in your symptoms with the dietary restrictions, I recommend that you begin to resume the foods you enjoy. Re-introduce them one at a time, so that if something does appear to trigger the pain, you can avoid it.    IF you received an x-ray today, you will receive an invoice from [facility]. Please contact [facility] at [phone] with questions or concerns regarding your invoice.   IF you received labwork today, you will receive an invoice from [place]. Please contact [facility] at [phone] with questions or concerns regarding your invoice.   Our billing staff will not be able to assist you with questions regarding bills from these companies.  You will be contacted with the lab results as soon as they are available. The fastest way to get your results is to activate your My Chart account. Instructions are located on the last page of this paperwork. If you have not heard from [place] regarding the results in 2 weeks, please contact this office.

## 2017-02-14 ENCOUNTER — Encounter: Payer: Self-pay | Admitting: Physician Assistant

## 2017-02-14 ENCOUNTER — Ambulatory Visit (INDEPENDENT_AMBULATORY_CARE_PROVIDER_SITE_OTHER): Payer: Medicare Other | Admitting: Physician Assistant

## 2017-02-14 VITALS — BP 120/58 | HR 70 | Ht 64.0 in | Wt 134.0 lb

## 2017-02-14 DIAGNOSIS — R1314 Dysphagia, pharyngoesophageal phase: Secondary | ICD-10-CM | POA: Diagnosis not present

## 2017-02-14 DIAGNOSIS — R1013 Epigastric pain: Secondary | ICD-10-CM | POA: Diagnosis not present

## 2017-02-14 DIAGNOSIS — K219 Gastro-esophageal reflux disease without esophagitis: Secondary | ICD-10-CM

## 2017-02-14 MED ORDER — OMEPRAZOLE 40 MG PO CPDR: 40.0000 mg | DELAYED_RELEASE_CAPSULE | Freq: Every day | ORAL | 3 refills | Status: DC

## 2017-02-14 NOTE — Progress Notes (Signed)
Chief Complaint: Abdominal Pain, GERD, Dysphagia  HPI:  Wanda Rivera is a 76 year old African-American female with a past medical history of arthritis and breast cancer, who was referred to me by Harrison Mons, PA-C for a complaint of epigastric pain, dysphagia and GERD.  She has followed with Dr. Silverio Decamp in the past for screening colonoscopy in 2017.   Today, the patient tells me that she has had reflux for "years", typically she uses Omeprazole 20 mg once daily and this controls her symptoms. She tells me that after Christmas she started drinking "regular coffee", and thinks that this irritated her stomach. Since that time she has had daily reflux symptoms. She tells me she went to the urgent care and they increased her Omeprazole to 20 mg twice a day and she also would take occasional Zantac, she did this for a while but continued with some epigastric pain and discomfort which was "unbearable", and also symptoms that irritated her at night as well that would leave her in the morning "feeling like something was stuck" in her throat. Patient tells me that she went back to the urgent care and was started on Carafate 1 g 4 times a day for 7 days this seemed to help her symptoms some but she still found herself sleeping sitting up in a chair because she was "scared to lay down" because she thought she may have more symptoms. Since that time the patient went back last week with continued abdominal pain, even though all of her symptoms had gotten some better, and she was finally referred to our office. She was decreased from her Omeprazole 40 mg twice a day that she was on at that time back to Omeprazole 20 mg twice a day because she had had some decrease in pain.   The patient tells me that she is worried about all of her symptoms above and the fact that she continues with some abdominal pain which is now more intermittent than before, she also worries about the feeling in her throat. Patient tells me she  has a history of breast cancer over 20 years ago but was told to always be extra careful about anything that "felt wrong" in her body. She feels like something is wrong now and is constantly worried about this.   Patient denies fever, chills, blood in her stool, melena, change in bowel habits, weight loss, fatigue, anorexia, nausea, vomiting or symptoms that awaken her from sleep.  Past Medical History:  Diagnosis Date  . 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 Breast cancer ([place])    1982, recurrence in 1985  . Cataract    Bil  . Glaucoma    Bil  . Osteoporosis     Past Surgical History:  Procedure Laterality Date  . ABDOMINAL HYSTERECTOMY    . BREAST SURGERY     double masectomy for breast cancer    Current Outpatient Prescriptions  Medication Sig Dispense Refill  . acetaminophen (TYLENOL) 500 MG tablet Take 500 mg by mouth as needed for moderate pain or headache.     . bimatoprost (LUMIGAN) 0.03 % ophthalmic solution Place 1 drop into both eyes at bedtime.     Marland Kitchen BIOTIN PO Take 1,000 mcg by mouth 2 (two) times daily.     . brimonidine (ALPHAGAN) 0.2 % ophthalmic solution     . Calcium Carb-Cholecalciferol (CALCIUM + D3) 600-200 MG-UNIT TABS Take 1 tablet by mouth 2 (two) times daily.     . Cholecalciferol (VITAMIN D3)  400 UNITS CAPS Take 400 Units by mouth daily.     . Cyanocobalamin (VITAMIN B 12 PO) Take 500 mg by mouth daily.    Marland Kitchen DIGESTIVE ENZYMES PO Take 1 capsule by mouth daily.     . dorzolamide-timolol (COSOPT) 22.3-6.8 MG/ML ophthalmic solution Place 1 drop into both eyes 2 (two) times daily.     . meclizine (ANTIVERT) 12.5 MG tablet Take 12.5 mg by mouth 3 (three) times daily as needed for dizziness.     . Multiple Vitamin (ONE-A-DAY ESSENTIAL PO) Take by mouth daily.    . Omega-3 Fat Ac-Cholecalciferol (OMEGA ESSENTIALS/VIT D3) LIQD Take 5 mLs by mouth daily.     Marland Kitchen omeprazole (PRILOSEC) 20 MG capsule TAKE 1 CAPSULE TWICE DAILY 180 capsule 3  . Turmeric 500 MG TABS Take by  mouth daily.    Marland Kitchen omeprazole (PRILOSEC) 40 MG capsule Take 1 capsule (40 mg total) by mouth daily. 30 capsule 3  . sucralfate (CARAFATE) 1 g tablet Take 1 tablet (1 g total) by mouth 4 (four) times daily -  with meals and at bedtime. (Patient not taking: Reported on 02/14/2017) 30 tablet 0   No current facility-administered medications for this visit.     Allergies as of 02/14/2017 - Review Complete 02/14/2017  Allergen Reaction Noted  . Darvon [propoxyphene]  02/28/2016  . Morphine and related  02/28/2016  . Percocet [oxycodone-acetaminophen]  02/28/2016  . Vicodin [hydrocodone-acetaminophen]  02/28/2016    Family History  Problem Relation Age of Onset  . Cancer Mother   . Breast cancer Mother   . Liver cancer Mother   . Diabetes Father   . Hypertension Father   . Hypertension Sister   . Diabetes Brother   . Heart disease Brother     Social History   Social History  . Marital status: Divorced    Spouse name: N/A  . Number of children: N/A  . Years of education: N/A   Occupational History  . Not on file.   Social History Main Topics  . Smoking status: Never Smoker  . Smokeless tobacco: Never Used  . Alcohol use 0.0 oz/week  . Drug use: No  . Sexual activity: No   Other Topics Concern  . Not on file   Social History Narrative  . No narrative on file    Review of Systems:    Constitutional: No weight loss, fever or chills Skin: No rash Cardiovascular: No chest pain  Respiratory: No SOB Gastrointestinal: See HPI and otherwise negative Genitourinary: No dysuria or change in urinary frequency Neurological: No headache Musculoskeletal: No new muscle or joint pain Hematologic: No bleeding or bruising Psychiatric: No history of depression or anxiety   Physical Exam:  Vital signs: BP (!) 120/58   Pulse 70   Ht 5\' 4"  (1.626 m)   Wt 134 lb (60.8 kg)   BMI 23.00 kg/m   Constitutional:   Pleasant African-American female appears to be in NAD, Well developed,  Well nourished, alert and cooperative Head:  Normocephalic and atraumatic. Eyes:   PEERL, EOMI. No icterus. Conjunctiva pink. Ears:  Normal auditory acuity. Neck:  Supple Throat: Oral cavity and pharynx without inflammation, swelling or lesion.  Respiratory: Respirations even and unlabored. Lungs clear to auscultation bilaterally.   No wheezes, crackles, or rhonchi.  Cardiovascular: Normal S1, S2. No MRG. Regular rate and rhythm. No peripheral edema, cyanosis or pallor.  Gastrointestinal:  Soft, nondistended, nontender. No rebound or guarding. Normal bowel sounds. No appreciable masses or hepatomegaly.  Rectal:  Not performed.  Msk:  Symmetrical without gross deformities. Without edema, no deformity or joint abnormality.  Neurologic:  Alert and  oriented x4;  grossly normal neurologically.  Skin:   Dry and intact without significant lesions or rashes. Psychiatric: Demonstrates good judgement and reason without abnormal affect or behaviors.  RELEVANT LABS AND IMAGING: CBC    Component Value Date/Time   WBC 5.8 01/10/2017 1059   WBC 9.1 03/20/2016 1615   RBC 4.31 01/10/2017 1059   RBC 4.96 03/20/2016 1615   HGB 13.9 01/10/2017 1059   HGB 15.6 (H) 03/20/2016 1615   HCT 39.9 01/10/2017 1059   HCT 43.3 03/20/2016 1615   PLT 150 03/20/2016 1615   MCV 92.6 01/10/2017 1059   MCH 32.2 (A) 01/10/2017 1059   MCH 31.5 03/20/2016 1615   MCHC 34.8 01/10/2017 1059   MCHC 36.0 03/20/2016 1615   RDW 13.8 03/20/2016 1615    CMP     Component Value Date/Time   NA 142 01/02/2017 1235   K 4.4 01/02/2017 1235   CL 101 01/02/2017 1235   CO2 27 01/02/2017 1235   GLUCOSE 84 01/02/2017 1235   GLUCOSE 94 03/20/2016 1615   BUN 15 01/02/2017 1235   CREATININE 0.94 01/02/2017 1235   CREATININE 1.31 (H) 02/22/2015 1154   CALCIUM 9.5 01/02/2017 1235   PROT 6.5 01/02/2017 1235   ALBUMIN 4.4 01/02/2017 1235   AST 21 01/02/2017 1235   ALT 14 01/02/2017 1235   ALKPHOS 105 01/02/2017 1235   BILITOT  0.5 01/02/2017 1235   GFRNONAA 60 01/02/2017 1235   GFRNONAA 50 (L) 08/11/2014 1452   GFRAA 69 01/02/2017 1235   GFRAA 57 (L) 08/11/2014 1452    Assessment: 1. Epigastric abdominal pain: Patient continues with intermittent abdominal pain after Carafate for 7 days as well as an increasing omeprazole, she has had a decrease from her original symptoms about a month ago, but is very worried due to her history of breast cancer; consider likely gastritis versus GERD versus other 2. GERD: Patient does continue with some reflux symptoms now that she is back on Omeprazole 20 mg twice a day 3. Dysphagia: Patient describes that she feels like something is "in her throat", sometimes this makes it feel like it is "hard to swallow", she denies any acute symptoms of food or substances getting stuck.  Plan: 1. We had a long discussion today about patient's options. 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 2. Scheduled patient for an EGD in the [facility] with Dr. Silverio Decamp. Did discuss risks, benefits, limitations and alternatives the patient agrees to proceed. 3. Increased patient's Omeprazole back to 40 mg twice a day, 30-60 minutes for breakfast and dinner #60 4. Reviewed anti-dysphagia and antireflux diet and lifestyle modifications. 5. Patient to follow in clinic per Dr. Woodward Ku recommendations after time of procedure.  Ellouise Newer, PA-C [facility] 02/14/2017, 12:53 PM  Cc: Harrison Mons, PA-C

## 2017-02-14 NOTE — Patient Instructions (Addendum)
It has been recommended to you by your physician that you have a(n) endoscopy completed. Per your request, we did not schedule the procedure(s) today. Please contact our office at [phone] should you decide to have the procedure completed.  We have sent the following medications to your pharmacy for you to pick up at your convenience: Omeprazole 40 mg twice a day

## 2017-02-20 ENCOUNTER — Ambulatory Visit (AMBULATORY_SURGERY_CENTER): Payer: Medicare Other | Admitting: Gastroenterology

## 2017-02-20 ENCOUNTER — Encounter: Payer: Self-pay | Admitting: Gastroenterology

## 2017-02-20 VITALS — BP 152/59 | HR 49 | Temp 97.5°F | Resp 17 | Ht 64.0 in | Wt 134.0 lb

## 2017-02-20 DIAGNOSIS — R1310 Dysphagia, unspecified: Secondary | ICD-10-CM | POA: Diagnosis not present

## 2017-02-20 DIAGNOSIS — R109 Unspecified abdominal pain: Secondary | ICD-10-CM | POA: Diagnosis not present

## 2017-02-20 DIAGNOSIS — R1013 Epigastric pain: Secondary | ICD-10-CM

## 2017-02-20 DIAGNOSIS — K319 Disease of stomach and duodenum, unspecified: Secondary | ICD-10-CM

## 2017-02-20 MED ORDER — SODIUM CHLORIDE 0.9 % IV SOLN: 500.0000 mL | INTRAVENOUS | Status: DC

## 2017-02-20 MED ORDER — SUCRALFATE 1 G PO TABS: 1.0000 g | ORAL_TABLET | Freq: Three times a day (TID) | ORAL | 3 refills | Status: DC

## 2017-02-20 NOTE — Progress Notes (Signed)
A/ox3 pleased with MAC, report to Penny RN 

## 2017-02-20 NOTE — Op Note (Signed)
[facility] Patient Name: Wanda Rivera Procedure Date: 02/20/2017 3:37 PM MRN: [id number] Endoscopist: Mauri Pole , MD Age: 76 Referring MD:  Date of Birth: 23-Jul-1941 Gender: Female Account #: [id number] Procedure:                Upper GI endoscopy Indications:              Dysphagia, Epigastric abdominal pain Medicines:                Monitored Anesthesia Care Procedure:                Pre-Anesthesia Assessment:                           - Prior to the procedure, a History and Physical                            was performed, and patient medications and                            allergies were reviewed. The patient's tolerance of                            previous anesthesia was also reviewed. The risks                            and benefits of the procedure and the sedation                            options and risks were discussed with the patient.                            All questions were answered, and informed consent                            was obtained. Prior Anticoagulants: The patient has                            taken no previous anticoagulant or antiplatelet                            agents. ASA Grade Assessment: II - A patient with                            mild systemic disease. After reviewing the risks                            and benefits, the patient was deemed in                            satisfactory condition to undergo the procedure.                           After obtaining informed consent, the endoscope was  passed under direct vision. Throughout the                            procedure, the patient's blood pressure, pulse, and                            oxygen saturations were monitored continuously. The                            Model GIF-[id number]) scope was introduced                            through the mouth, and advanced to the second part                            of  duodenum. The upper GI endoscopy was                            accomplished without difficulty. The patient                            tolerated the procedure well. Scope In: Scope Out: Findings:                 The esophagus was normal.                           Diffuse mild inflammation characterized by                            congestion (edema) and erythema was found in the                            entire examined stomach. Biopsies were taken with a                            cold forceps for Helicobacter pylori testing using                            CLOtest.                           A few localized erosions without bleeding were                            found in the duodenal bulb and in the second                            portion of the duodenum. Complications:            No immediate complications. Estimated Blood Loss:     Estimated blood loss was minimal. Impression:               - Normal esophagus.                           -  Gastritis. Biopsied.                           - Duodenal erosions without bleeding. Recommendation:           - Patient has a contact number available for                            emergencies. The signs and symptoms of potential                            delayed complications were discussed with the                            patient. Return to normal activities tomorrow.                            Written discharge instructions were provided to the                            patient.                           - Resume previous diet.                           - Continue present medications.                           - No aspirin, ibuprofen, naproxen, or other                            non-steroidal anti-inflammatory drugs for 2 weeks.                           - Await pathology results. Mauri Pole, MD 02/20/2017 3:57:00 PM This report has been signed electronically.

## 2017-02-20 NOTE — Progress Notes (Signed)
Called to room to assist during endoscopic procedure.  Patient ID and intended procedure confirmed with present staff. Received instructions for my participation in the procedure from the performing physician.  

## 2017-02-20 NOTE — Patient Instructions (Signed)
YOU HAD AN ENDOSCOPIC PROCEDURE TODAY AT [facility]:   Refer to the procedure report that was given to you for any specific questions about what was found during the examination.  If the procedure report does not answer your questions, please call your gastroenterologist to clarify.  If you requested that your care partner not be given the details of your procedure findings, then the procedure report has been included in a sealed envelope for you to review at your convenience later.  YOU SHOULD EXPECT: Some feelings of bloating in the abdomen. Passage of more gas than usual.  Walking can help get rid of the air that was put into your GI tract during the procedure and reduce the bloating. If you had a lower endoscopy (such as a colonoscopy or flexible sigmoidoscopy) you may notice spotting of blood in your stool or on the toilet paper. If you underwent a bowel prep for your procedure, you may not have a normal bowel movement for a few days.  Please Note:  You might notice some irritation and congestion in your nose or some drainage.  This is from the oxygen used during your procedure.  There is no need for concern and it should clear up in a day or so.  SYMPTOMS TO REPORT IMMEDIATELY:     Following upper endoscopy (EGD)  Vomiting of blood or coffee ground material  New chest pain or pain under the shoulder blades  Painful or persistently difficult swallowing  New shortness of breath  Fever of 100F or higher  Black, tarry-looking stools  For urgent or emergent issues, a gastroenterologist can be reached at any hour by calling [phone].   DIET:  We do recommend a small meal at first, but then you may proceed to your regular diet.  Drink plenty of fluids but you should avoid alcoholic beverages for 24 hours.  ACTIVITY:  You should plan to take it easy for the rest of today and you should NOT DRIVE or use heavy machinery until tomorrow (because of the sedation medicines  used during the test).    FOLLOW UP: Our staff will call the number listed on your records the next business day following your procedure to check on you and address any questions or concerns that you may have regarding the information given to you following your procedure. If we do not reach you, we will leave a message.  However, if you are feeling well and you are not experiencing any problems, there is no need to return our call.  We will assume that you have returned to your regular daily activities without incident.  If any biopsies were taken you will be contacted by phone or by letter within the next 1-3 weeks.  Please call us at [phone] if you have not heard about the biopsies in 3 weeks.    SIGNATURES/CONFIDENTIALITY: You and/or your care partner have signed paperwork which will be entered into your electronic medical record.  These signatures attest to the fact that that the information above on your After Visit Summary has been reviewed and is understood.  Full responsibility of the confidentiality of this discharge information lies with you and/or your care-partner.   NO ASPIRIN ,IBUPROFEN,NAPROXEN,OR OTHER ANTI INFLAMMATORY PRODUCTS FOR 2 WEEKS  AWAIT PATHOLOGY RESULTS

## 2017-02-20 NOTE — Progress Notes (Signed)
Dental advisory given to patient 

## 2017-02-22 ENCOUNTER — Telehealth: Payer: Self-pay | Admitting: *Deleted

## 2017-02-22 ENCOUNTER — Other Ambulatory Visit (INDEPENDENT_AMBULATORY_CARE_PROVIDER_SITE_OTHER): Payer: Medicare Other

## 2017-02-22 DIAGNOSIS — R1013 Epigastric pain: Secondary | ICD-10-CM | POA: Diagnosis not present

## 2017-02-22 LAB — HELICOBACTER PYLORI SCREEN-BIOPSY: UREASE: NEGATIVE

## 2017-02-22 NOTE — Telephone Encounter (Signed)
  Follow up Call-  Call back number 02/20/2017 03/13/2016  Post procedure Call Back phone  # [phone]  Permission to leave phone message Yes No  Some recent data might be hidden     Patient questions:  Do you have a fever, pain , or abdominal swelling? No. Pain Score  0 *  Have you tolerated food without any problems? Yes.    Have you been able to return to your normal activities? Yes.    Do you have any questions about your discharge instructions: Diet   No. Medications  No. Follow up visit  No.  Do you have questions or concerns about your Care? No.  Actions: * If pain score is 4 or above: No action needed, pain <4.

## 2017-02-25 NOTE — Progress Notes (Signed)
Reviewed and agree with documentation and assessment and plan. K. Veena Kaleea Penner , MD   

## 2017-02-27 ENCOUNTER — Telehealth: Payer: Self-pay | Admitting: Gastroenterology

## 2017-02-27 NOTE — Telephone Encounter (Signed)
She can taper the dose of carafate to twice daily for a month and then as long as symptoms are well controlled, subsequently use it as needed. Follow up in office visit as needed. Thanks

## 2017-02-27 NOTE — Telephone Encounter (Signed)
Spoke with pt and reviewed path-negative H pylori result with her. Pt states she is feeling better, she is taking carafate 4 times a day and has 3 refills on the script. Pt wants to know how long she should stay on the carafate and if she needs a follow-up visit. Please advise.

## 2017-02-28 NOTE — Telephone Encounter (Signed)
Spoke with pt and she is aware.

## 2017-03-21 IMAGING — CR DG CHEST 2V
2 series · 2 of 2 positions shown · non-contrast
Comparison: PA and lateral chest 02/22/2015.

CLINICAL DATA: Syncope this morning. Right leg pain. Initial
encounter.

EXAM:
CHEST  2 VIEW

[w chest lat]
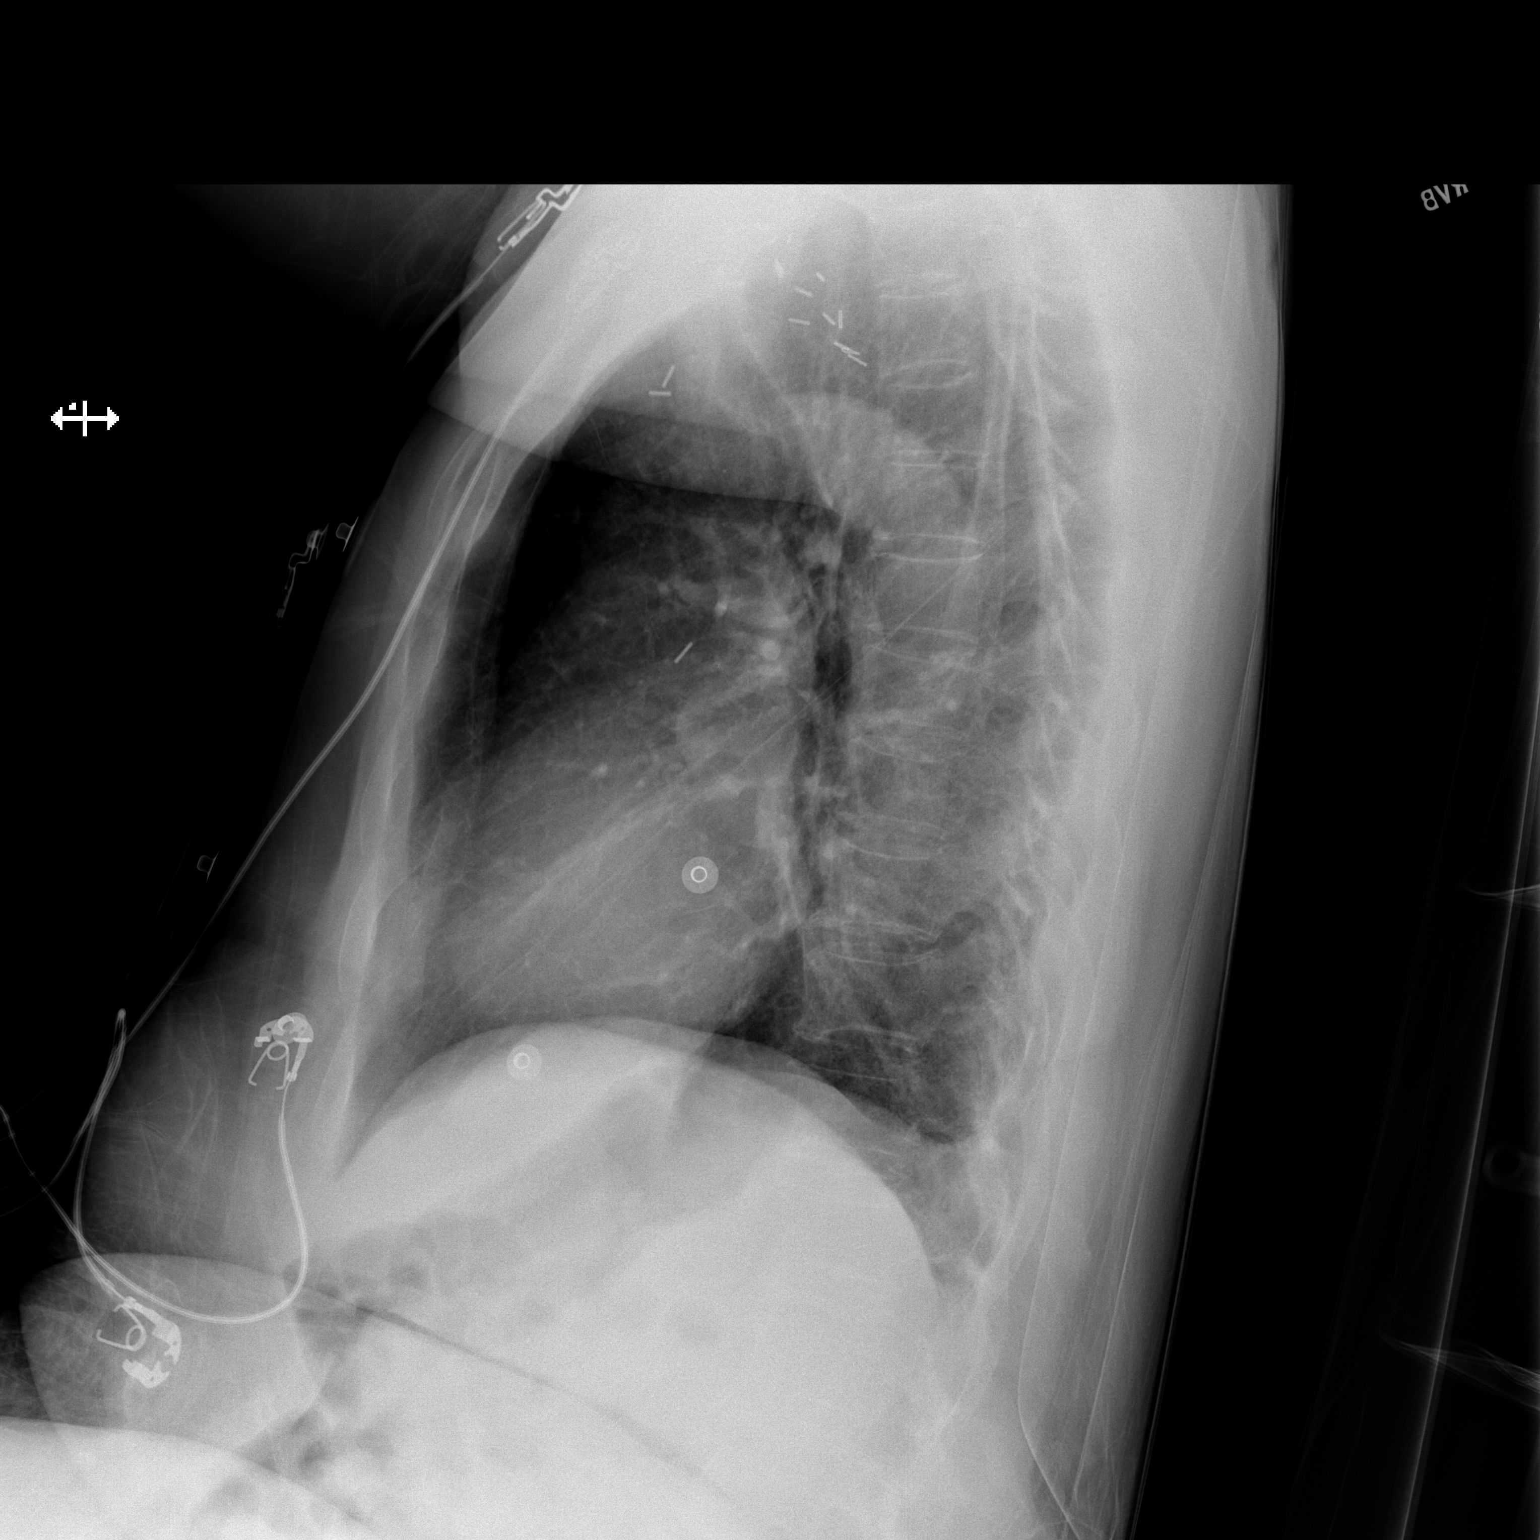

[x chest ap]
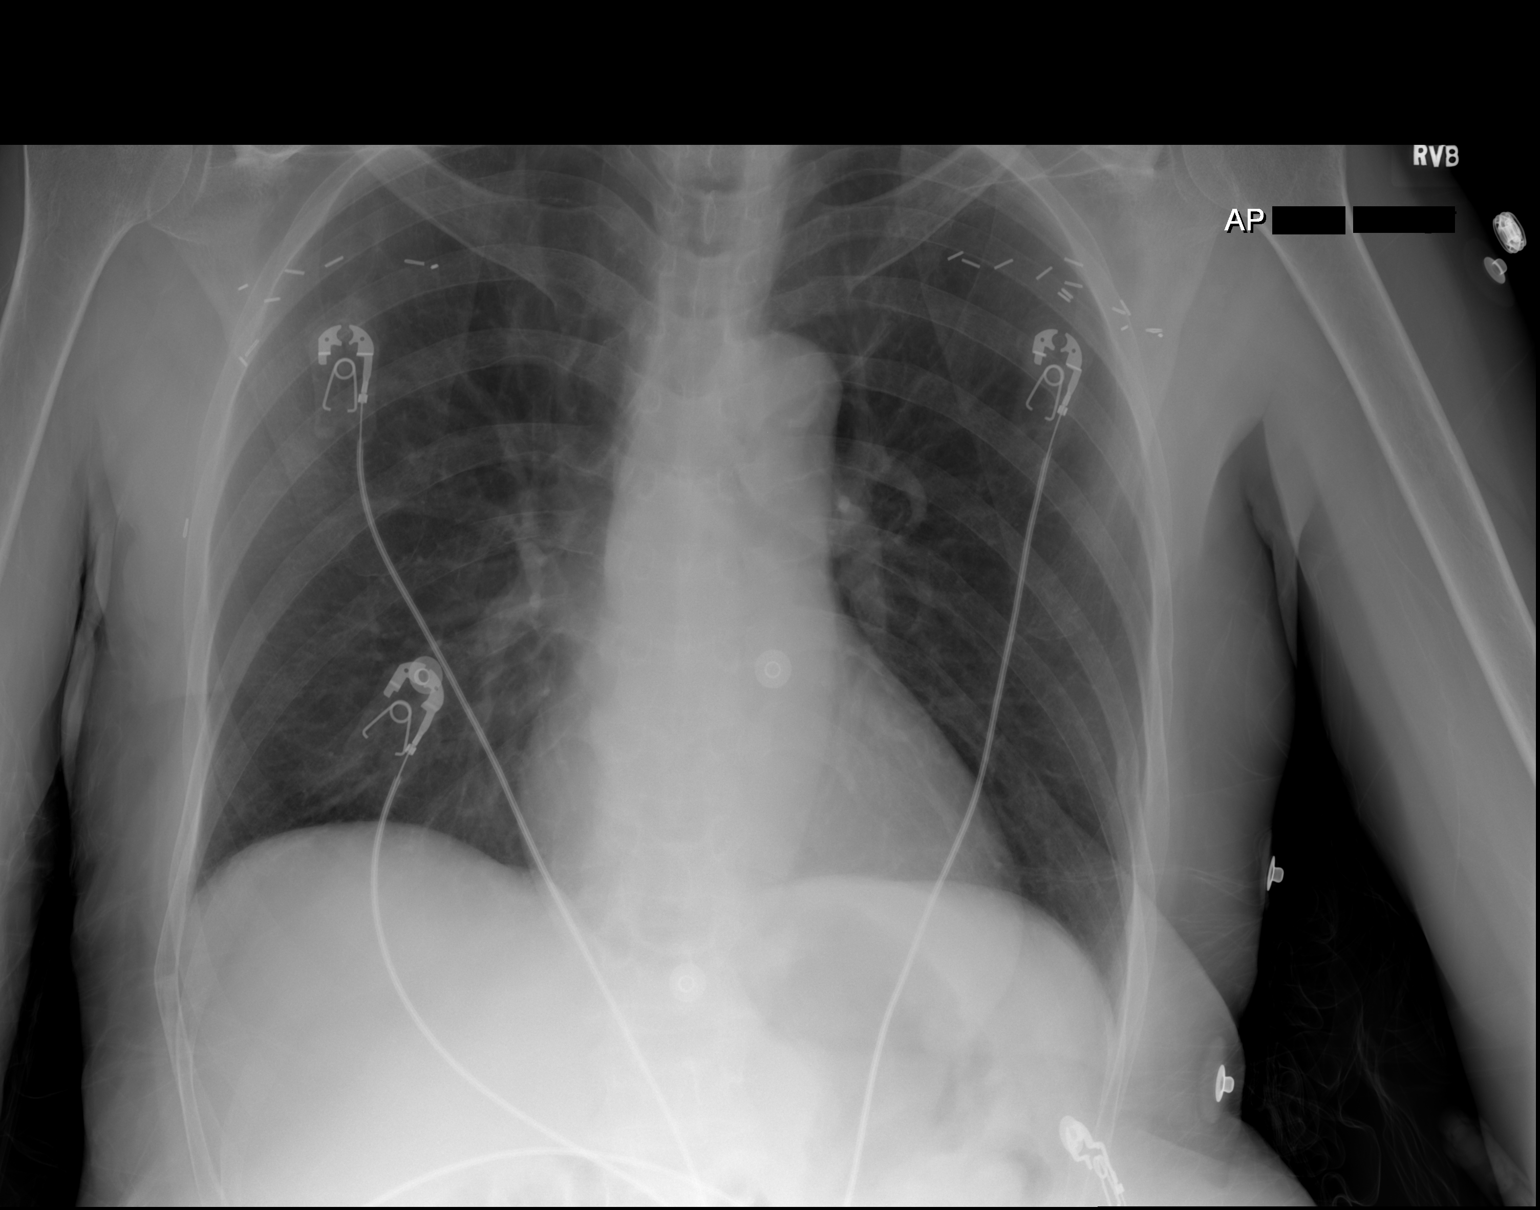

[2 of 2 positions shown; findings below may reference images not displayed]

FINDINGS: Surgical clips projecting over the upper chest are unchanged. Lungs
are clear. Heart size is normal. No pneumothorax or pleural
effusion. No focal bony abnormality.
IMPRESSION: No acute disease.

## 2017-03-21 IMAGING — CR DG ANKLE COMPLETE 3+V*R*
3 series · 3 of 3 positions shown · non-contrast
Comparison: None.

CLINICAL DATA: Syncopal episode this morning.  Fell.

EXAM:
DG HIP (WITH OR WITHOUT PELVIS) 2-3V RIGHT; RIGHT ANKLE - COMPLETE
3+ VIEW; RIGHT KNEE - COMPLETE 4+ VIEW

[x ankle ap right]
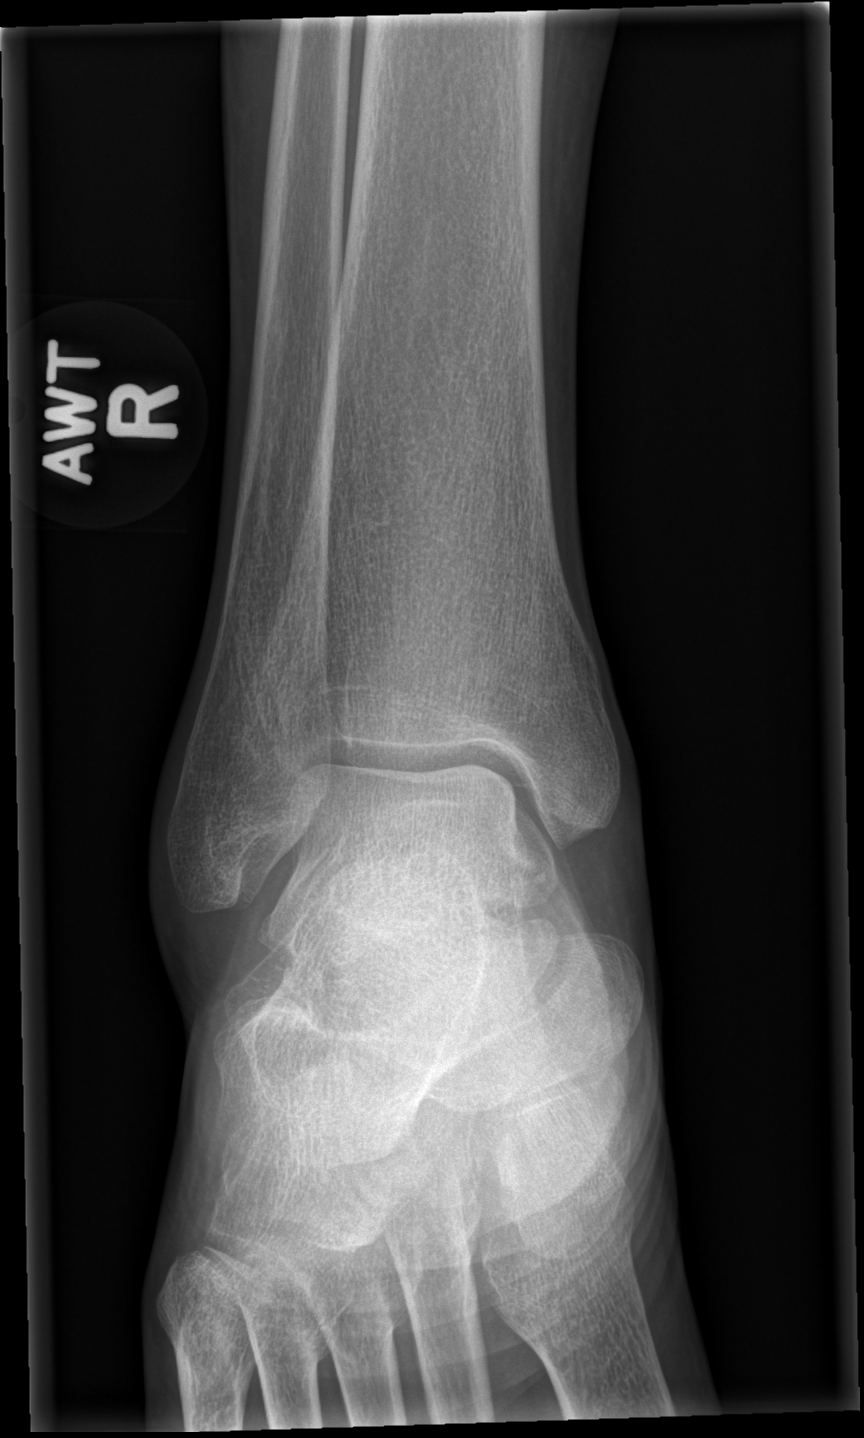

[x ankle obl right]
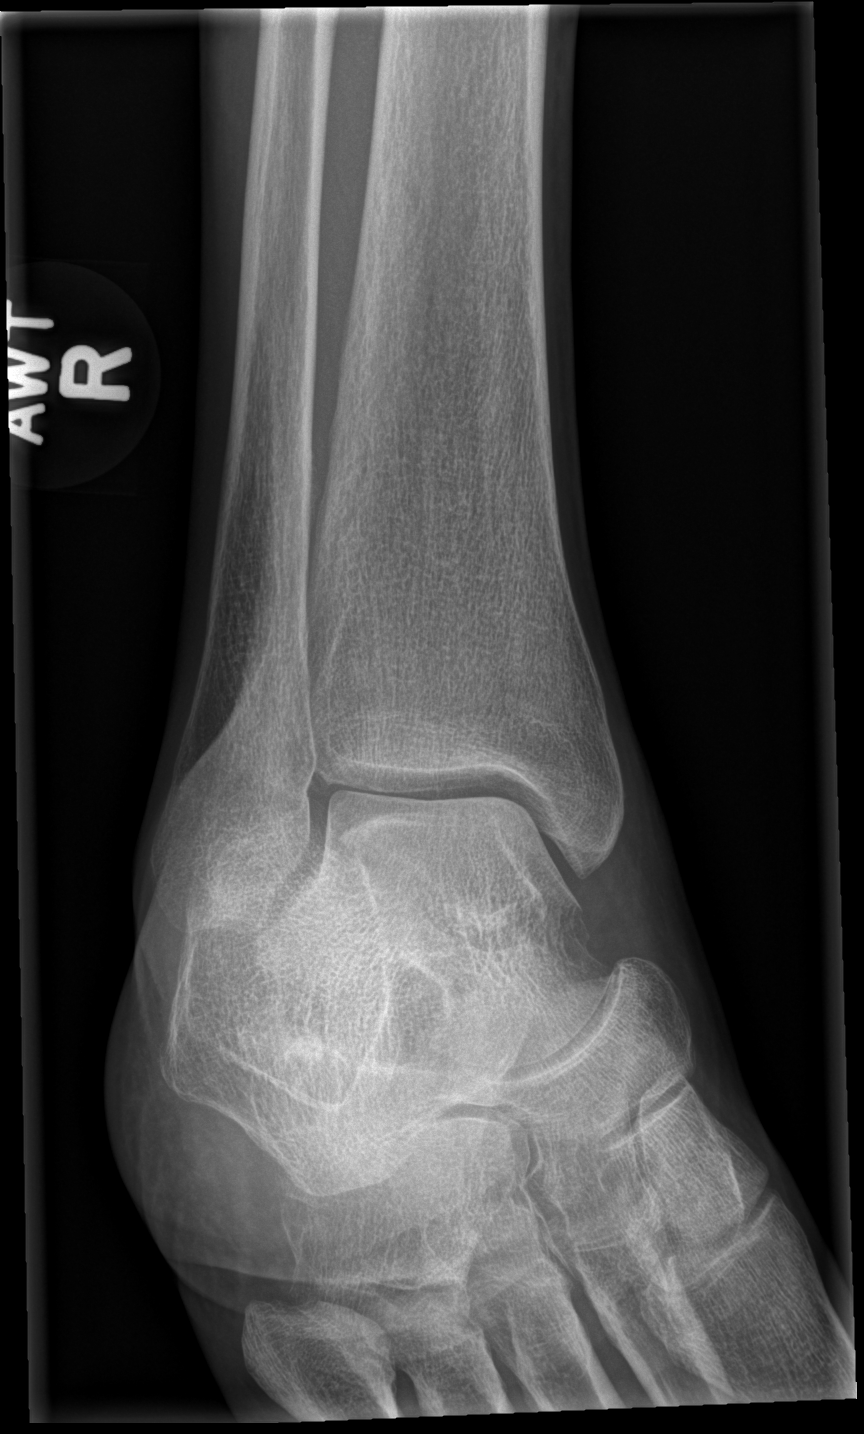

[x ankle lat right]
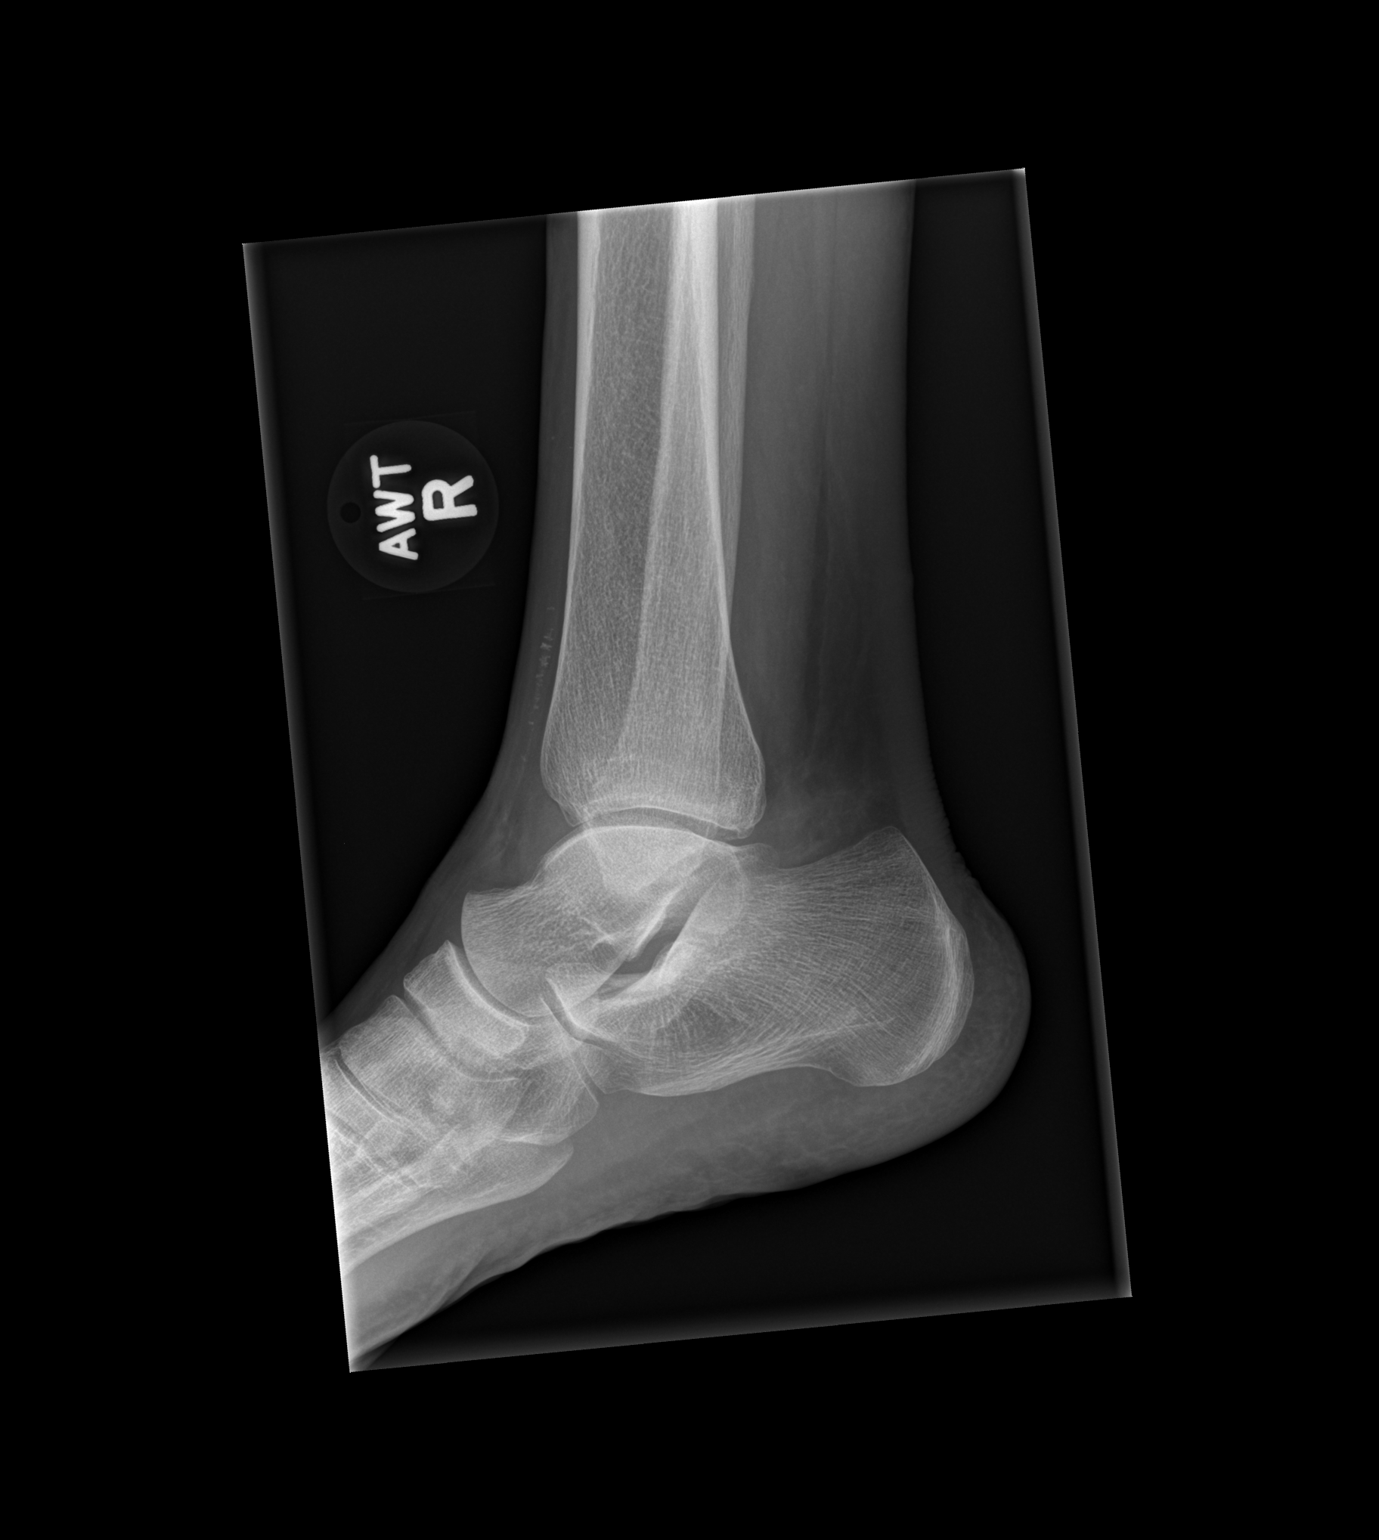

[3 of 3 positions shown; findings below may reference images not displayed]

FINDINGS: Right hip:

Both hips are normally located. No acute hip fracture or plain film
evidence of avascular necrosis. Minimal hip joint degenerative
changes bilaterally. The pubic symphysis and SI joints are intact.
No definite pelvic fractures.

Right knee:

The joint spaces are maintained. No acute fracture or osteochondral
abnormality. Subchondral cystic change or intraosseous ganglion
involving the lateral femoral condyle. Small joint effusion is
noted.

Right ankle:

The ankle mortise is maintained. No acute ankle fracture. The mid
and hindfoot bony structures are intact. Vascular calcifications are
noted.
IMPRESSION: No acute fracture involving the right hip, right knee or right
ankle.

Small right knee joint effusion.

## 2017-03-21 IMAGING — CT CT KNEE*R* W/O CM
3 of 4 series · 12 of 33 positions shown, 14 images · non-contrast
Comparison: Right knee radiographs 03/20/2016

CLINICAL DATA: Syncope with fall. Right knee pain. Unable to
ambulate.

EXAM:
CT OF THE right KNEE WITHOUT CONTRAST
TECHNIQUE: Multidetector CT imaging of the right knee was performed according
to the standard protocol. Multiplanar CT image reconstructions were
also generated.

[Series 5: coronal bone · coronal · 0.29mm/px · 3 of 71 slices shown]
[im 15/71  bone]
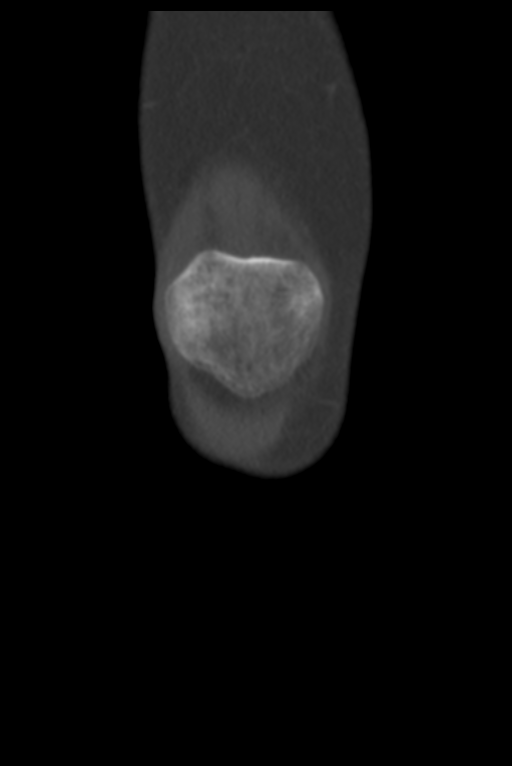
[im 29/71  bone]
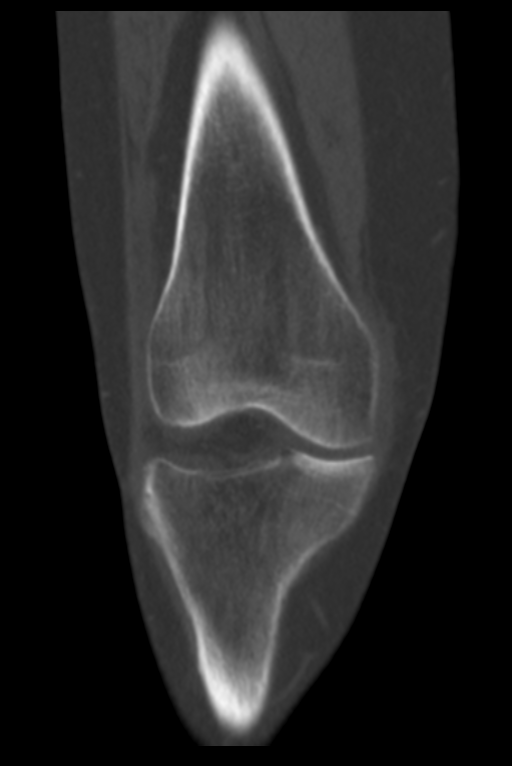
[im 43/71  bone]
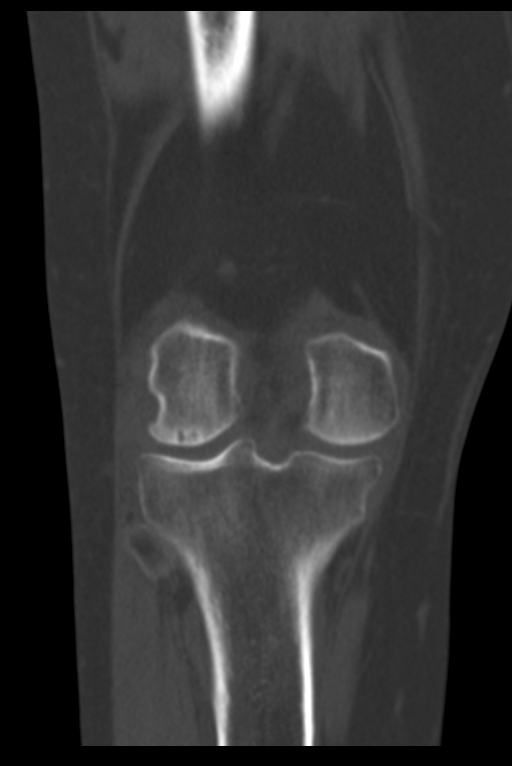

[Series 6: sagittal bone · sagittal · 0.29mm/px · 5 of 67 slices shown, 6 images]
[im 23/67  bone]
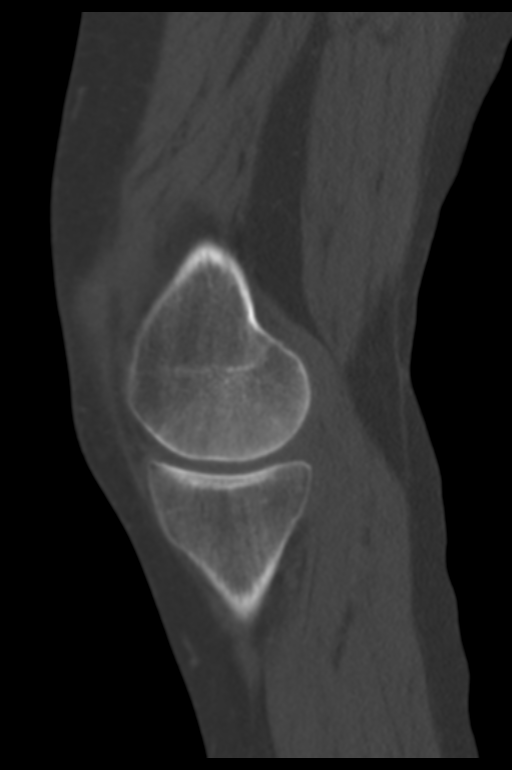
[im 28/67  bone]
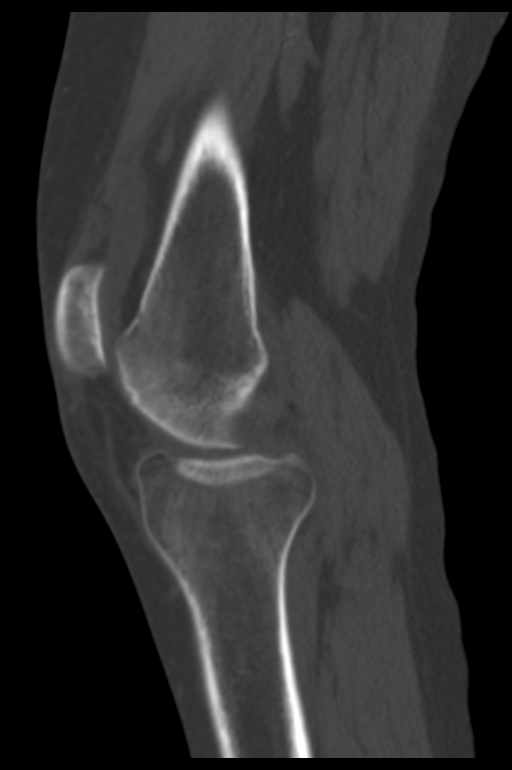
[im 34/67  soft-tissue]
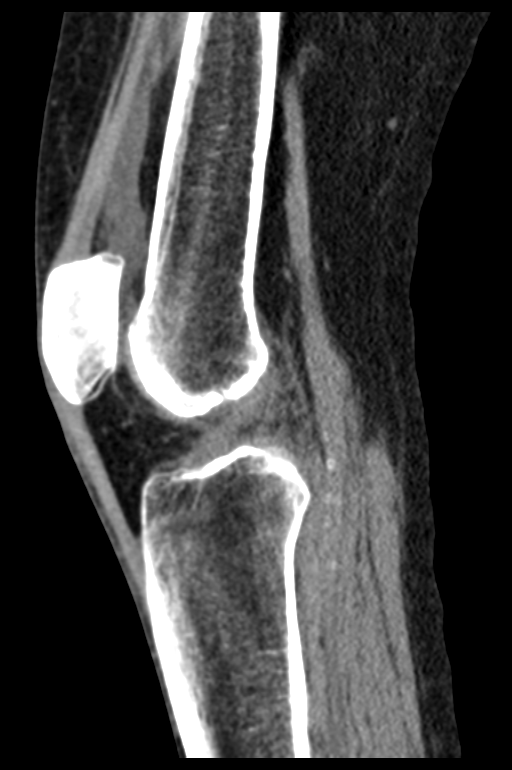
[im 34/67  bone]
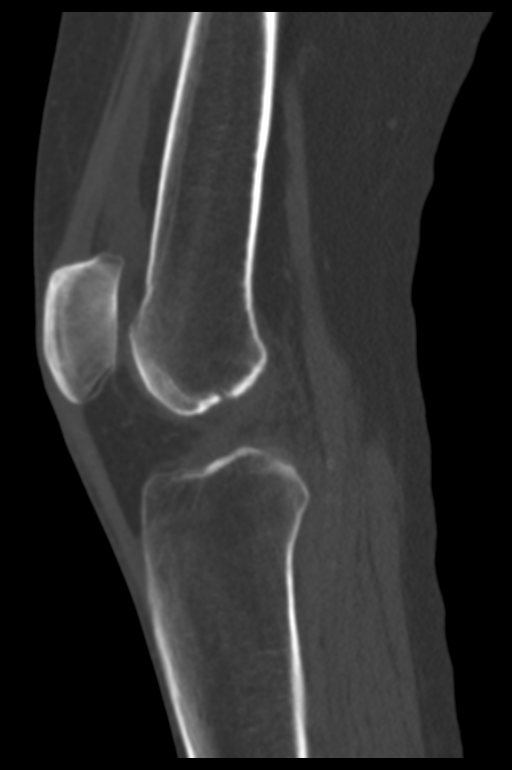
[im 39/67  bone]
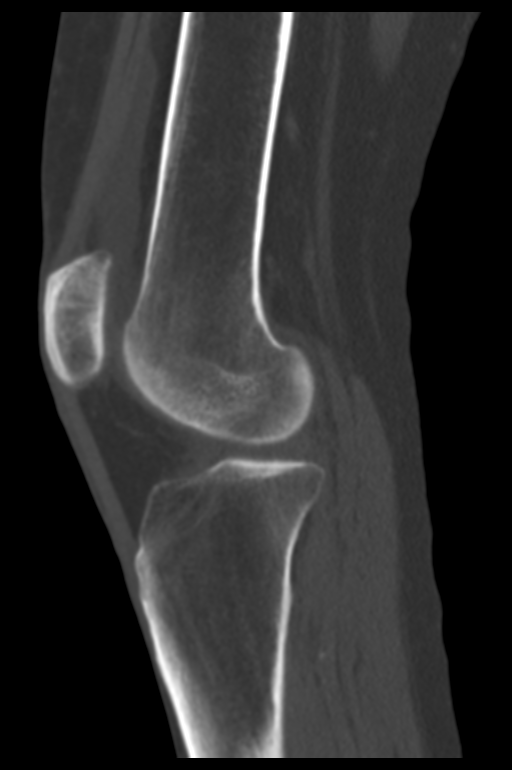
[im 45/67  bone]
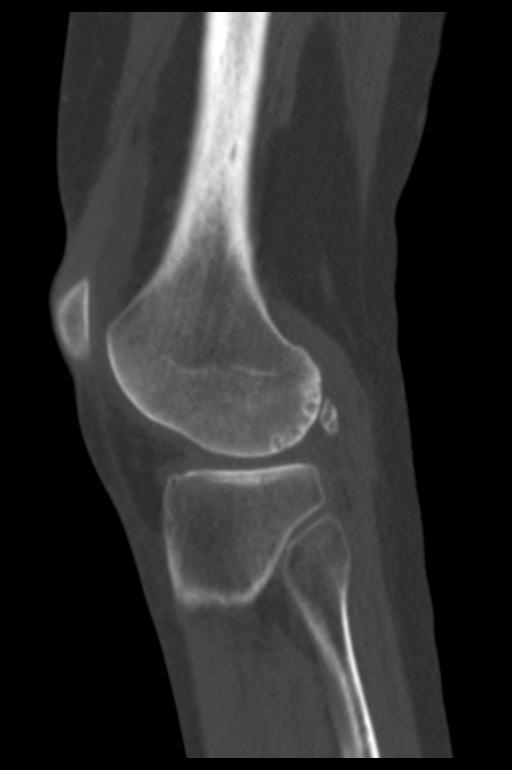

[Series 7: axial bone · axial · 0.31mm/px · z∈[+528,+666]mm · 4 of 105 slices shown, 5 images]
[im 24/105  soft-tissue]
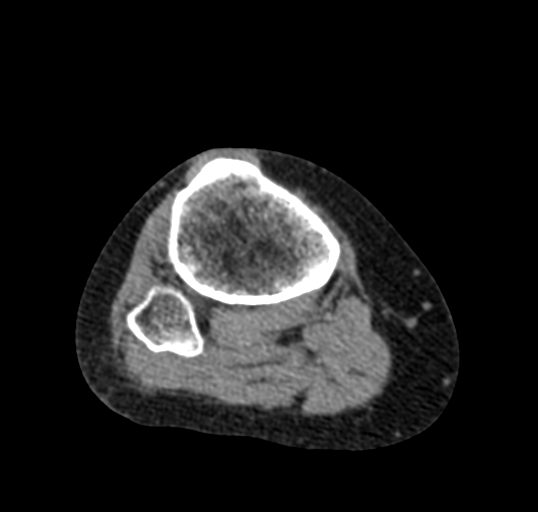
[im 24/105  bone]
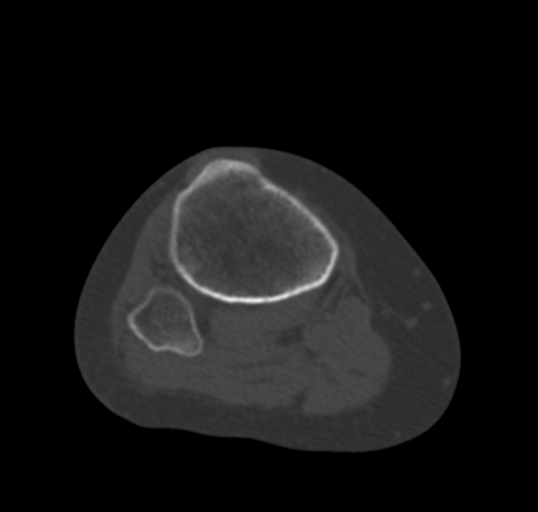
[im 47/105  bone]
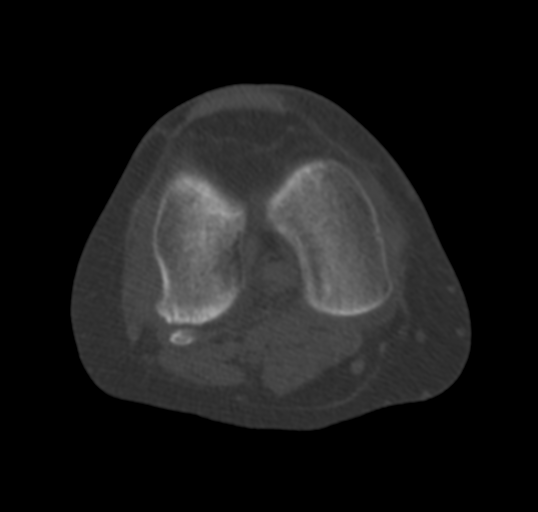
[im 70/105  bone]
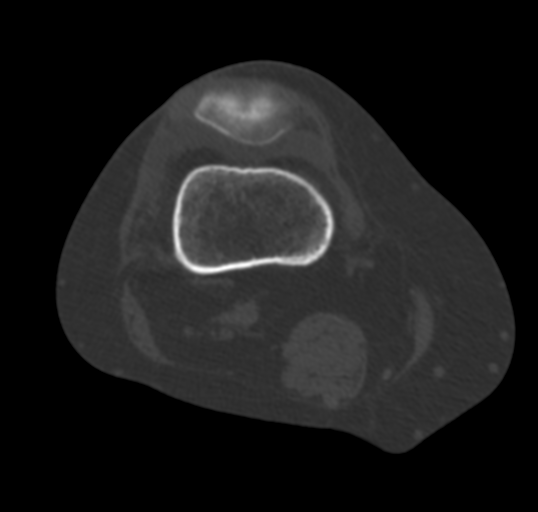
[im 93/105  bone]
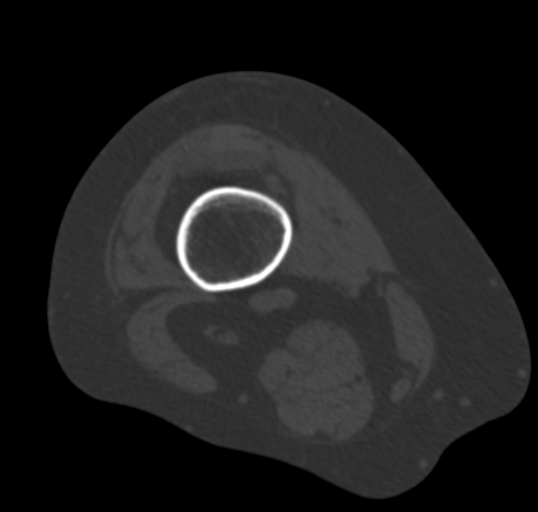

[12 of 33 positions shown; findings below may reference images not displayed]

FINDINGS: Degenerative changes in the right knee with mild medial and lateral
compartment narrowing. Subcortical cystic changes in the posterior
right lateral femoral condyle. These likely represent degenerative
cysts. Mild surface contour irregularity of the lateral femoral
cortex likely degenerative. No specific osteochondral lesion. Small
osteophytes. Small right knee effusion. No evidence of acute
fracture or dislocation. No focal bone sclerosis or bone
destruction. Vascular calcifications in the popliteal artery.
IMPRESSION: Degenerative changes in the right knee, most prominent in the
lateral compartment. Small effusion. No acute fracture or
dislocation identified.

## 2017-03-26 DIAGNOSIS — H401123 Primary open-angle glaucoma, left eye, severe stage: Secondary | ICD-10-CM | POA: Diagnosis not present

## 2017-03-26 DIAGNOSIS — H401112 Primary open-angle glaucoma, right eye, moderate stage: Secondary | ICD-10-CM | POA: Diagnosis not present

## 2017-04-09 DIAGNOSIS — H401112 Primary open-angle glaucoma, right eye, moderate stage: Secondary | ICD-10-CM | POA: Diagnosis not present

## 2017-04-09 DIAGNOSIS — H401123 Primary open-angle glaucoma, left eye, severe stage: Secondary | ICD-10-CM | POA: Diagnosis not present

## 2017-05-07 DIAGNOSIS — H401123 Primary open-angle glaucoma, left eye, severe stage: Secondary | ICD-10-CM | POA: Diagnosis not present

## 2017-05-07 DIAGNOSIS — H401111 Primary open-angle glaucoma, right eye, mild stage: Secondary | ICD-10-CM | POA: Diagnosis not present

## 2017-06-05 HISTORY — PX: OTHER SURGICAL HISTORY: SHX169

## 2017-06-06 DIAGNOSIS — H401111 Primary open-angle glaucoma, right eye, mild stage: Secondary | ICD-10-CM | POA: Diagnosis not present

## 2017-06-06 DIAGNOSIS — H401123 Primary open-angle glaucoma, left eye, severe stage: Secondary | ICD-10-CM | POA: Diagnosis not present

## 2017-06-10 DIAGNOSIS — H401122 Primary open-angle glaucoma, left eye, moderate stage: Secondary | ICD-10-CM | POA: Diagnosis not present

## 2017-06-10 DIAGNOSIS — H401123 Primary open-angle glaucoma, left eye, severe stage: Secondary | ICD-10-CM | POA: Diagnosis not present

## 2017-07-26 DIAGNOSIS — H401112 Primary open-angle glaucoma, right eye, moderate stage: Secondary | ICD-10-CM | POA: Diagnosis not present

## 2017-07-26 DIAGNOSIS — H401123 Primary open-angle glaucoma, left eye, severe stage: Secondary | ICD-10-CM | POA: Diagnosis not present

## 2017-08-12 DIAGNOSIS — H401112 Primary open-angle glaucoma, right eye, moderate stage: Secondary | ICD-10-CM | POA: Diagnosis not present

## 2017-08-12 DIAGNOSIS — H401123 Primary open-angle glaucoma, left eye, severe stage: Secondary | ICD-10-CM | POA: Diagnosis not present

## 2017-09-13 DIAGNOSIS — H401123 Primary open-angle glaucoma, left eye, severe stage: Secondary | ICD-10-CM | POA: Diagnosis not present

## 2017-09-13 DIAGNOSIS — Z23 Encounter for immunization: Secondary | ICD-10-CM | POA: Diagnosis not present

## 2017-09-13 DIAGNOSIS — H401112 Primary open-angle glaucoma, right eye, moderate stage: Secondary | ICD-10-CM | POA: Diagnosis not present

## 2017-09-23 DIAGNOSIS — H401133 Primary open-angle glaucoma, bilateral, severe stage: Secondary | ICD-10-CM | POA: Diagnosis not present

## 2017-09-30 DIAGNOSIS — H401133 Primary open-angle glaucoma, bilateral, severe stage: Secondary | ICD-10-CM | POA: Diagnosis not present

## 2017-09-30 DIAGNOSIS — Z961 Presence of intraocular lens: Secondary | ICD-10-CM | POA: Diagnosis not present

## 2017-10-15 DIAGNOSIS — Z961 Presence of intraocular lens: Secondary | ICD-10-CM | POA: Diagnosis not present

## 2017-10-15 DIAGNOSIS — H401111 Primary open-angle glaucoma, right eye, mild stage: Secondary | ICD-10-CM | POA: Diagnosis not present

## 2017-10-15 DIAGNOSIS — H401123 Primary open-angle glaucoma, left eye, severe stage: Secondary | ICD-10-CM | POA: Diagnosis not present

## 2017-11-11 DIAGNOSIS — H401123 Primary open-angle glaucoma, left eye, severe stage: Secondary | ICD-10-CM | POA: Diagnosis not present

## 2017-11-11 DIAGNOSIS — H409 Unspecified glaucoma: Secondary | ICD-10-CM | POA: Diagnosis not present

## 2017-12-13 ENCOUNTER — Other Ambulatory Visit (INDEPENDENT_AMBULATORY_CARE_PROVIDER_SITE_OTHER): Payer: Medicare Other

## 2017-12-13 ENCOUNTER — Encounter: Payer: Self-pay | Admitting: Internal Medicine

## 2017-12-13 ENCOUNTER — Ambulatory Visit (INDEPENDENT_AMBULATORY_CARE_PROVIDER_SITE_OTHER): Payer: Medicare Other | Admitting: Internal Medicine

## 2017-12-13 VITALS — BP 120/80 | HR 63 | Temp 98.4°F | Ht 64.0 in | Wt 131.0 lb

## 2017-12-13 DIAGNOSIS — Z0000 Encounter for general adult medical examination without abnormal findings: Secondary | ICD-10-CM

## 2017-12-13 DIAGNOSIS — K219 Gastro-esophageal reflux disease without esophagitis: Secondary | ICD-10-CM

## 2017-12-13 DIAGNOSIS — H409 Unspecified glaucoma: Secondary | ICD-10-CM | POA: Diagnosis not present

## 2017-12-13 DIAGNOSIS — E785 Hyperlipidemia, unspecified: Secondary | ICD-10-CM | POA: Diagnosis not present

## 2017-12-13 LAB — LIPID PANEL
CHOLESTEROL: 179 mg/dL (ref 0–200)
HDL: 47.8 mg/dL (ref >39.00)
LDL Cholesterol: 118 mg/dL — ABNORMAL HIGH (ref 0–99)
NONHDL: 131.28
Total CHOL/HDL Ratio: 4
Triglycerides: 67 mg/dL (ref 0.0–149.0)
VLDL: 13.4 mg/dL (ref 0.0–40.0)

## 2017-12-13 LAB — COMPREHENSIVE METABOLIC PANEL
ALBUMIN: 4 g/dL (ref 3.5–5.2)
ALK PHOS: 76 U/L (ref 39–117)
ALT: 11 U/L (ref 0–35)
AST: 18 U/L (ref 0–37)
BILIRUBIN TOTAL: 0.5 mg/dL (ref 0.2–1.2)
BUN: 17 mg/dL (ref 6–23)
CO2: 32 mEq/L (ref 19–32)
Calcium: 9.2 mg/dL (ref 8.4–10.5)
Chloride: 106 mEq/L (ref 96–112)
Creatinine, Ser: 1.04 mg/dL (ref 0.40–1.20)
GFR: 66.39 mL/min (ref >60.00)
Glucose, Bld: 86 mg/dL (ref 70–99)
POTASSIUM: 4.5 meq/L (ref 3.5–5.1)
Sodium: 142 mEq/L (ref 135–145)
TOTAL PROTEIN: 6.8 g/dL (ref 6.0–8.3)

## 2017-12-13 LAB — CBC
HEMATOCRIT: 42.5 % (ref 36.0–46.0)
HEMOGLOBIN: 14.1 g/dL (ref 12.0–15.0)
MCHC: 33.2 g/dL (ref 30.0–36.0)
MCV: 95.1 fl (ref 78.0–100.0)
Platelets: 182 10*3/uL (ref 150.0–400.0)
RBC: 4.47 Mil/uL (ref 3.87–5.11)
RDW: 13.6 % (ref 11.5–15.5)
WBC: 6.3 10*3/uL (ref 4.0–10.5)

## 2017-12-13 NOTE — Progress Notes (Signed)
   Subjective:    Patient ID: Wanda Rivera, female    DOB: [birth year], 76 y.o.   MRN: [id number]  HPI Here for medicare wellness, no new complaints. Please see A/P for status and treatment of chronic medical problems.   HPI #2: Follow up of GERD (stopped sucralfate as she did not need it anymore, takes omeprazole daily, has to avoid certain food triggers, rarely takes zantac as well), and her glaucoma (severe and has had 2 different procedures on the left eye to help with pressure and may need a third procedure)  Diet: heart healthy Physical activity: sedentary Depression/mood screen: negative Hearing: intact to whispered voice Visual acuity: grossly normal, glaucoma, performs annual eye exam  ADLs: capable Fall risk: none Home safety: good Cognitive evaluation: intact to orientation, naming, recall and repetition EOL planning: adv directives discussed  I have personally reviewed and have noted 1. The patient's medical and social history - reviewed today no changes 2. Their use of alcohol, tobacco or illicit drugs 3. Their current medications and supplements 4. The patient's functional ability including ADL's, fall risks, home safety risks and hearing or visual impairment. 5. Diet and physical activities 6. Evidence for depression or mood disorders 7. Care team reviewed and updated (available in snapshot)  Review of Systems  Constitutional: Negative.   HENT: Negative.   Eyes: Negative.   Respiratory: Negative for cough, chest tightness and shortness of breath.   Cardiovascular: Negative for chest pain, palpitations and leg swelling.  Gastrointestinal: Negative for abdominal distention, abdominal pain, constipation, diarrhea, nausea and vomiting.  Musculoskeletal: Negative.   Skin: Negative.   Neurological: Negative.   Psychiatric/Behavioral: Negative.       Objective:   Physical Exam  Constitutional: She is oriented to person, place, and time. She appears well-developed  and well-nourished.  HENT:  Head: Normocephalic and atraumatic.  Eyes: EOM are normal.  Neck: Normal range of motion.  Cardiovascular: Normal rate and regular rhythm.  Pulmonary/Chest: Effort normal and breath sounds normal. No respiratory distress. She has no wheezes. She has no rales.  Abdominal: Soft. Bowel sounds are normal. She exhibits no distension. There is no tenderness. There is no rebound.  Musculoskeletal: She exhibits no edema.  Neurological: She is alert and oriented to person, place, and time. Coordination normal.  Skin: Skin is warm and dry.  Psychiatric: She has a normal mood and affect.   Vitals:   12/13/17 0759  BP: 120/80  Pulse: 63  Temp: 98.4 F (36.9 C)  TempSrc: Oral  SpO2: 99%  Weight: 131 lb (59.4 kg)  Height: 5\' 4"  (1.626 m)      Assessment & Plan:

## 2017-12-13 NOTE — Assessment & Plan Note (Signed)
Taking omeprazole daily and this is controlling symptoms most of the time with rare additional zantac. Removed sucralfate from list as she took this temporarily during flare.

## 2017-12-13 NOTE — Assessment & Plan Note (Signed)
Colonoscopy and mammogram up to date. Flu shot and tetanus and pneumonia up to date. Counseled on shingrix and she will look into insurance coverage. Counseled about diet and exercise as well as sun safety. Given 10 year screening recommendations.

## 2017-12-13 NOTE — Patient Instructions (Signed)
We will check the blood work today.   Think about getting the shingles vaccine shingrix if the insurance will cover it.  Health Maintenance, Female Adopting a healthy lifestyle and getting preventive care can go a long way to promote health and wellness. Talk with your health care provider about what schedule of regular examinations is right for you. This is a good chance for you to check in with your provider about disease prevention and staying healthy. In between checkups, there are plenty of things you can do on your own. Experts have done a lot of research about which lifestyle changes and preventive measures are most likely to keep you healthy. Ask your health care provider for more information. Weight and diet Eat a healthy diet  Be sure to include plenty of vegetables, fruits, low-fat dairy products, and lean protein.  Do not eat a lot of foods high in solid fats, added sugars, or salt.  Get regular exercise. This is one of the most important things you can do for your health. ? Most adults should exercise for at least 150 minutes each week. The exercise should increase your heart rate and make you sweat (moderate-intensity exercise). ? Most adults should also do strengthening exercises at least twice a week. This is in addition to the moderate-intensity exercise.  Maintain a healthy weight  Body mass index (BMI) is a measurement that can be used to identify possible weight problems. It estimates body fat based on height and weight. Your health care provider can help determine your BMI and help you achieve or maintain a healthy weight.  For females 61 years of age and older: ? A BMI below 18.5 is considered underweight. ? A BMI of 18.5 to 24.9 is normal. ? A BMI of 25 to 29.9 is considered overweight. ? A BMI of 30 and above is considered obese.  Watch levels of cholesterol and blood lipids  You should start having your blood tested for lipids and cholesterol at 75 years of age,  then have this test every 5 years.  You may need to have your cholesterol levels checked more often if: ? Your lipid or cholesterol levels are high. ? You are older than 75 years of age. ? You are at high risk for heart disease.  Cancer screening Lung Cancer  Lung cancer screening is recommended for adults 84-92 years old who are at high risk for lung cancer because of a history of smoking.  A yearly low-dose CT scan of the lungs is recommended for people who: ? Currently smoke. ? Have quit within the past 15 years. ? Have at least a 30-pack-year history of smoking. A pack year is smoking an average of one pack of cigarettes a day for 1 year.  Yearly screening should continue until it has been 15 years since you quit.  Yearly screening should stop if you develop a health problem that would prevent you from having lung cancer treatment.  Breast Cancer  Practice breast self-awareness. This means understanding how your breasts normally appear and feel.  It also means doing regular breast self-exams. Let your health care provider know about any changes, no matter how small.  If you are in your 20s or 30s, you should have a clinical breast exam (CBE) by a health care provider every 1-3 years as part of a regular health exam.  If you are 35 or older, have a CBE every year. Also consider having a breast X-ray (mammogram) every year.  If you  have a family history of breast cancer, talk to your health care provider about genetic screening.  If you are at high risk for breast cancer, talk to your health care provider about having an MRI and a mammogram every year.  Breast cancer gene (BRCA) assessment is recommended for women who have family members with BRCA-related cancers. BRCA-related cancers include: ? Breast. ? Ovarian. ? Tubal. ? Peritoneal cancers.  Results of the assessment will determine the need for genetic counseling and BRCA1 and BRCA2 testing.  Cervical Cancer Your  health care provider may recommend that you be screened regularly for cancer of the pelvic organs (ovaries, uterus, and vagina). This screening involves a pelvic examination, including checking for microscopic changes to the surface of your cervix (Pap test). You may be encouraged to have this screening done every 3 years, beginning at age 60.  For women ages 9-65, health care providers may recommend pelvic exams and Pap testing every 3 years, or they may recommend the Pap and pelvic exam, combined with testing for human papilloma virus (HPV), every 5 years. Some types of HPV increase your risk of cervical cancer. Testing for HPV may also be done on women of any age with unclear Pap test results.  Other health care providers may not recommend any screening for nonpregnant women who are considered low risk for pelvic cancer and who do not have symptoms. Ask your health care provider if a screening pelvic exam is right for you.  If you have had past treatment for cervical cancer or a condition that could lead to cancer, you need Pap tests and screening for cancer for at least 20 years after your treatment. If Pap tests have been discontinued, your risk factors (such as having a new sexual partner) need to be reassessed to determine if screening should resume. Some women have medical problems that increase the chance of getting cervical cancer. In these cases, your health care provider may recommend more frequent screening and Pap tests.  Colorectal Cancer  This type of cancer can be detected and often prevented.  Routine colorectal cancer screening usually begins at 75 years of age and continues through 75 years of age.  Your health care provider may recommend screening at an earlier age if you have risk factors for colon cancer.  Your health care provider may also recommend using home test kits to check for hidden blood in the stool.  A small camera at the end of a tube can be used to examine your  colon directly (sigmoidoscopy or colonoscopy). This is done to check for the earliest forms of colorectal cancer.  Routine screening usually begins at age 19.  Direct examination of the colon should be repeated every 5-10 years through 75 years of age. However, you may need to be screened more often if early forms of precancerous polyps or small growths are found.  Skin Cancer  Check your skin from head to toe regularly.  Tell your health care provider about any new moles or changes in moles, especially if there is a change in a mole's shape or color.  Also tell your health care provider if you have a mole that is larger than the size of a pencil eraser.  Always use sunscreen. Apply sunscreen liberally and repeatedly throughout the day.  Protect yourself by wearing long sleeves, pants, a wide-brimmed hat, and sunglasses whenever you are outside.  Heart disease, diabetes, and high blood pressure  High blood pressure causes heart disease and increases the  risk of stroke. High blood pressure is more likely to develop in: ? People who have blood pressure in the high end of the normal range (130-139/85-89 mm Hg). ? People who are overweight or obese. ? People who are African American.  If you are 18-39 years of age, have your blood pressure checked every 3-5 years. If you are 40 years of age or older, have your blood pressure checked every year. You should have your blood pressure measured twice-once when you are at a hospital or clinic, and once when you are not at a hospital or clinic. Record the average of the two measurements. To check your blood pressure when you are not at a hospital or clinic, you can use: ? An automated blood pressure machine at a pharmacy. ? A home blood pressure monitor.  If you are between 55 years and 79 years old, ask your health care provider if you should take aspirin to prevent strokes.  Have regular diabetes screenings. This involves taking a blood sample  to check your fasting blood sugar level. ? If you are at a normal weight and have a low risk for diabetes, have this test once every three years after 75 years of age. ? If you are overweight and have a high risk for diabetes, consider being tested at a younger age or more often. Preventing infection Hepatitis B  If you have a higher risk for hepatitis B, you should be screened for this virus. You are considered at high risk for hepatitis B if: ? You were born in a country where hepatitis B is common. Ask your health care provider which countries are considered high risk. ? Your parents were born in a high-risk country, and you have not been immunized against hepatitis B (hepatitis B vaccine). ? You have HIV or AIDS. ? You use needles to inject street drugs. ? You live with someone who has hepatitis B. ? You have had sex with someone who has hepatitis B. ? You get hemodialysis treatment. ? You take certain medicines for conditions, including cancer, organ transplantation, and autoimmune conditions.  Hepatitis C  Blood testing is recommended for: ? Everyone born from 1945 through 1965. ? Anyone with known risk factors for hepatitis C.  Sexually transmitted infections (STIs)  You should be screened for sexually transmitted infections (STIs) including gonorrhea and chlamydia if: ? You are sexually active and are younger than 75 years of age. ? You are older than 75 years of age and your health care provider tells you that you are at risk for this type of infection. ? Your sexual activity has changed since you were last screened and you are at an increased risk for chlamydia or gonorrhea. Ask your health care provider if you are at risk.  If you do not have HIV, but are at risk, it may be recommended that you take a prescription medicine daily to prevent HIV infection. This is called pre-exposure prophylaxis (PrEP). You are considered at risk if: ? You are sexually active and do not  regularly use condoms or know the HIV status of your partner(s). ? You take drugs by injection. ? You are sexually active with a partner who has HIV.  Talk with your health care provider about whether you are at high risk of being infected with HIV. If you choose to begin PrEP, you should first be tested for HIV. You should then be tested every 3 months for as long as you are taking PrEP. Pregnancy    If you are premenopausal and you may become pregnant, ask your health care provider about preconception counseling.  If you may become pregnant, take 400 to 800 micrograms (mcg) of folic acid every day.  If you want to prevent pregnancy, talk to your health care provider about birth control (contraception). Osteoporosis and menopause  Osteoporosis is a disease in which the bones lose minerals and strength with aging. This can result in serious bone fractures. Your risk for osteoporosis can be identified using a bone density scan.  If you are 37 years of age or older, or if you are at risk for osteoporosis and fractures, ask your health care provider if you should be screened.  Ask your health care provider whether you should take a calcium or vitamin D supplement to lower your risk for osteoporosis.  Menopause may have certain physical symptoms and risks.  Hormone replacement therapy may reduce some of these symptoms and risks. Talk to your health care provider about whether hormone replacement therapy is right for you. Follow these instructions at home:  Schedule regular health, dental, and eye exams.  Stay current with your immunizations.  Do not use any tobacco products including cigarettes, chewing tobacco, or electronic cigarettes.  If you are pregnant, do not drink alcohol.  If you are breastfeeding, limit how much and how often you drink alcohol.  Limit alcohol intake to no more than 1 drink per day for nonpregnant women. One drink equals 12 ounces of beer, 5 ounces of wine, or  1 ounces of hard liquor.  Do not use street drugs.  Do not share needles.  Ask your health care provider for help if you need support or information about quitting drugs.  Tell your health care provider if you often feel depressed.  Tell your health care provider if you have ever been abused or do not feel safe at home. This information is not intended to replace advice given to you by your health care provider. Make sure you discuss any questions you have with your health care provider. Document Released: 07/02/2011 Document Revised: 05/24/2016 Document Reviewed: 09/20/2015 Elsevier Interactive Patient Education  [employer].

## 2017-12-13 NOTE — Assessment & Plan Note (Signed)
Still seeing eye specialist and they are following closely.

## 2017-12-13 NOTE — Assessment & Plan Note (Signed)
Checking lipid panel, previous LDL at goal off meds currently due to diet and exercise. Adjust as needed.

## 2018-01-27 DIAGNOSIS — H401123 Primary open-angle glaucoma, left eye, severe stage: Secondary | ICD-10-CM | POA: Diagnosis not present

## 2018-01-27 DIAGNOSIS — H409 Unspecified glaucoma: Secondary | ICD-10-CM | POA: Diagnosis not present

## 2018-02-19 ENCOUNTER — Other Ambulatory Visit: Payer: Self-pay | Admitting: Internal Medicine

## 2018-02-20 ENCOUNTER — Ambulatory Visit (INDEPENDENT_AMBULATORY_CARE_PROVIDER_SITE_OTHER): Payer: Medicare Other

## 2018-02-20 ENCOUNTER — Ambulatory Visit (INDEPENDENT_AMBULATORY_CARE_PROVIDER_SITE_OTHER): Payer: Medicare Other | Admitting: Family Medicine

## 2018-02-20 ENCOUNTER — Encounter: Payer: Self-pay | Admitting: Family Medicine

## 2018-02-20 ENCOUNTER — Other Ambulatory Visit: Payer: Self-pay

## 2018-02-20 VITALS — BP 149/83 | HR 72 | Temp 97.8°F | Resp 18 | Ht 64.0 in | Wt 130.8 lb

## 2018-02-20 DIAGNOSIS — M19011 Primary osteoarthritis, right shoulder: Secondary | ICD-10-CM | POA: Diagnosis not present

## 2018-02-20 DIAGNOSIS — M25511 Pain in right shoulder: Secondary | ICD-10-CM | POA: Diagnosis not present

## 2018-02-20 NOTE — Progress Notes (Signed)
Chief Complaint  Patient presents with  . Arm Pain    right shoulder and arm pain  x 1 month     HPI  Pt is a breast cancer survivor so she was concerned about her pain in the right arm that radiates from the shoulder into the right chest She states that she has been having pain starting last month She reports that she has pain that goes down the arm from the shoulder to the elbow on the right and also in the shoulder joint on the right She was diagnosed with OA of the shoulder years ago She was prescribed NSAIDs but it flared up her acid reflux She has good grip strength but sometimes she cannot open bottles or making twisting motions    Past Medical History:  Diagnosis Date  . 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 Breast cancer ([place])    1982, recurrence in 1985  . Cataract    Bil  . Glaucoma    Bil  . Osteoporosis     Current Outpatient Medications  Medication Sig Dispense Refill  . acetaminophen (TYLENOL) 500 MG tablet Take 500 mg by mouth as needed for moderate pain or headache.     . bimatoprost (LUMIGAN) 0.03 % ophthalmic solution Place 1 drop into both eyes at bedtime.     Marland Kitchen BIOTIN PO Take 1,000 mcg by mouth 2 (two) times daily.     . brimonidine (ALPHAGAN) 0.2 % ophthalmic solution     . Calcium Carb-Cholecalciferol (CALCIUM + D3) 600-200 MG-UNIT TABS Take 1 tablet by mouth 2 (two) times daily.     . Cholecalciferol (VITAMIN D3) 400 UNITS CAPS Take 400 Units by mouth daily.     . Cyanocobalamin (VITAMIN B 12 PO) Take 500 mg by mouth daily.    Marland Kitchen DIGESTIVE ENZYMES PO Take 1 capsule by mouth daily.     . dorzolamide-timolol (COSOPT) 22.3-6.8 MG/ML ophthalmic solution Place 1 drop into both eyes 2 (two) times daily.     . meclizine (ANTIVERT) 12.5 MG tablet Take 12.5 mg by mouth 3 (three) times daily as needed for dizziness.     . Multiple Vitamin (ONE-A-DAY ESSENTIAL PO) Take by mouth daily.    . Omega-3 Fat Ac-Cholecalciferol (OMEGA ESSENTIALS/VIT D3) LIQD Take 5 mLs by  mouth daily.     Marland Kitchen omeprazole (PRILOSEC) 40 MG capsule Take 1 capsule (40 mg total) by mouth daily. 30 capsule 3  . prednisoLONE acetate (PRED FORTE) 1 % ophthalmic suspension     . Turmeric 500 MG TABS Take by mouth daily.     No current facility-administered medications for this visit.     Allergies:  Allergies  Allergen Reactions  . Darvon [Propoxyphene]     Darvocet-Makes her "crazy"  . Morphine And Related     Makes her "crazy"  . Percocet [Oxycodone-Acetaminophen]     Makes her "crazy"  . Vicodin [Hydrocodone-Acetaminophen]     Makes her "crazy"    Past Surgical History:  Procedure Laterality Date  . ABDOMINAL HYSTERECTOMY    . BREAST SURGERY     double masectomy for breast cancer  . eye surgery Bilateral 06/05/2017   for Glaucoma    Social History   Socioeconomic History  . Marital status: Divorced    Spouse name: None  . Number of children: None  . Years of education: None  . Highest education level: None  Social Needs  . Financial resource strain: None  . Food insecurity - worry:  None  . Food insecurity - inability: None  . Transportation needs - medical: None  . Transportation needs - non-medical: None  Occupational History  . None  Tobacco Use  . Smoking status: Never Smoker  . Smokeless tobacco: Never Used  Substance and Sexual Activity  . Alcohol use: Yes    Alcohol/week: 0.0 oz    Comment: occasionally a glass of red wine  . Drug use: No  . Sexual activity: No  Other Topics Concern  . None  Social History Narrative  . None    Family History  Problem Relation Age of Onset  . Cancer Mother   . Breast cancer Mother   . Liver cancer Mother   . Diabetes Father   . Hypertension Father   . Hypertension Sister   . Diabetes Brother   . Heart disease Brother      ROS Review of Systems See HPI Constitution: No fevers or chills No malaise No diaphoresis Skin: No rash or itching Eyes: no blurry vision, no double vision GU: no dysuria  or hematuria Neuro: no dizziness or headaches * all others reviewed and negative   Objective: Vitals:   02/20/18 0820  BP: (!) 149/83  Pulse: 72  Resp: 18  Temp: 97.8 F (36.6 C)  TempSrc: Oral  SpO2: 99%  Weight: 130 lb 12.8 oz (59.3 kg)  Height: 5\' 4"  (1.626 m)    Physical Exam  Constitutional: She is oriented to person, place, and time. She appears well-developed and well-nourished.  HENT:  Head: Normocephalic and atraumatic.  Eyes: Conjunctivae and EOM are normal.  Cardiovascular: Normal rate, regular rhythm and normal heart sounds.  No murmur heard. Pulmonary/Chest: Effort normal and breath sounds normal. No stridor. No respiratory distress.  Neurological: She is alert and oriented to person, place, and time.  Skin: Skin is warm. Capillary refill takes less than 2 seconds.  Psychiatric: She has a normal mood and affect. Her behavior is normal. Judgment and thought content normal.   Right Shoulder Exam Tenderness in the ac joint on the right Tenderness in the biceps groove on the right Crepitus of the right shoulder  Tenderness in all ranges of motion   EXAM: RIGHT SHOULDER - 2+ VIEW  COMPARISON:  01/10/2017  FINDINGS: Degenerative changes of the acromioclavicular joint are noted and slightly more marked than that seen on the prior exam. Some remodeling of the acromion is noted and narrowing of the subacromial space suggestive of possible rotator cuff injury. No abnormality of the underlying bony thorax is noted. No fracture is seen. Postsurgical changes are noted.  IMPRESSION: Degenerative change and findings suggestive of rotator cuff injury. No acute fracture is noted.   Electronically Signed   By: Inez Catalina M.D.   On: 02/20/2018 09:17   Assessment and Plan Debroah was seen today for arm pain.  Diagnoses and all orders for this visit:  Acute pain of right shoulder -     DG Shoulder Right; Future  Primary osteoarthritis of right  shoulder   Advised pt to see Orthopedics to discuss minimally invasive options Offered her corticosteroid injection today but she will wait  Discussed that she should follow up for discussion  Xray performed today showed findings of rotator cuff injury     [place]

## 2018-02-20 NOTE — Patient Instructions (Signed)
     IF you received an x-ray today, you will receive an invoice from [facility]. Please contact [facility] at [phone] with questions or concerns regarding your invoice.   IF you received labwork today, you will receive an invoice from [facility]. Please contact [facility] at [phone] with questions or concerns regarding your invoice.   Our billing staff will not be able to assist you with questions regarding bills from these companies.  You will be contacted with the lab results as soon as they are available. The fastest way to get your results is to activate your My Chart account. Instructions are located on the last page of this paperwork. If you have not heard from us regarding the results in 2 weeks, please contact this office.     

## 2018-02-21 ENCOUNTER — Encounter (HOSPITAL_COMMUNITY): Payer: Self-pay | Admitting: Emergency Medicine

## 2018-02-21 ENCOUNTER — Emergency Department (HOSPITAL_COMMUNITY)
Admission: EM | Admit: 2018-02-21 | Discharge: 2018-02-22 | Disposition: A | Discharge status: Home / Self Care | Payer: Medicare Other | Attending: Emergency Medicine | Admitting: Emergency Medicine

## 2018-02-21 ENCOUNTER — Emergency Department (HOSPITAL_COMMUNITY): Payer: Medicare Other

## 2018-02-21 DIAGNOSIS — Z79899 Other long term (current) drug therapy: Secondary | ICD-10-CM | POA: Insufficient documentation

## 2018-02-21 DIAGNOSIS — E860 Dehydration: Secondary | ICD-10-CM | POA: Diagnosis not present

## 2018-02-21 DIAGNOSIS — R42 Dizziness and giddiness: Secondary | ICD-10-CM | POA: Diagnosis not present

## 2018-02-21 DIAGNOSIS — Z853 Personal history of malignant neoplasm of breast: Secondary | ICD-10-CM | POA: Diagnosis not present

## 2018-02-21 DIAGNOSIS — R110 Nausea: Secondary | ICD-10-CM | POA: Diagnosis not present

## 2018-02-21 LAB — RAPID URINE DRUG SCREEN, HOSP PERFORMED
Amphetamines: NOT DETECTED
BENZODIAZEPINES: NOT DETECTED
Barbiturates: NOT DETECTED
Cocaine: NOT DETECTED
OPIATES: NOT DETECTED
Tetrahydrocannabinol: NOT DETECTED

## 2018-02-21 LAB — URINALYSIS, ROUTINE W REFLEX MICROSCOPIC
BACTERIA UA: NONE SEEN
Bilirubin Urine: NEGATIVE
Glucose, UA: NEGATIVE mg/dL
HGB URINE DIPSTICK: NEGATIVE
Ketones, ur: NEGATIVE mg/dL
Nitrite: NEGATIVE
PROTEIN: NEGATIVE mg/dL
RBC / HPF: NONE SEEN RBC/hpf (ref 0–5)
SQUAMOUS EPITHELIAL / LPF: NONE SEEN
Specific Gravity, Urine: 1.004 — ABNORMAL LOW (ref 1.005–1.030)
pH: 7 (ref 5.0–8.0)

## 2018-02-21 LAB — BASIC METABOLIC PANEL
ANION GAP: 10 (ref 5–15)
BUN: 18 mg/dL (ref 6–20)
CHLORIDE: 102 mmol/L (ref 101–111)
CO2: 27 mmol/L (ref 22–32)
Calcium: 9.2 mg/dL (ref 8.9–10.3)
Creatinine, Ser: 0.95 mg/dL (ref 0.44–1.00)
GFR calc non Af Amer: 57 mL/min — ABNORMAL LOW (ref >60)
Glucose, Bld: 117 mg/dL — ABNORMAL HIGH (ref 65–99)
Potassium: 4.2 mmol/L (ref 3.5–5.1)
Sodium: 139 mmol/L (ref 135–145)

## 2018-02-21 LAB — I-STAT TROPONIN, ED: TROPONIN I, POC: 0.01 ng/mL (ref 0.00–0.08)

## 2018-02-21 LAB — CBC
HEMATOCRIT: 39.8 % (ref 36.0–46.0)
HEMOGLOBIN: 13.4 g/dL (ref 12.0–15.0)
MCH: 31.5 pg (ref 26.0–34.0)
MCHC: 33.7 g/dL (ref 30.0–36.0)
MCV: 93.4 fL (ref 78.0–100.0)
Platelets: 175 10*3/uL (ref 150–400)
RBC: 4.26 MIL/uL (ref 3.87–5.11)
RDW: 14.2 % (ref 11.5–15.5)
WBC: 6.7 10*3/uL (ref 4.0–10.5)

## 2018-02-21 LAB — CBG MONITORING, ED: GLUCOSE-CAPILLARY: 90 mg/dL (ref 65–99)

## 2018-02-21 MED ORDER — MECLIZINE HCL 25 MG PO TABS
25.0000 mg | ORAL_TABLET | Freq: Once | ORAL | Status: AC
  Administered 2018-02-21: 25 mg via ORAL
  Filled 2018-02-21: qty 1

## 2018-02-21 MED ORDER — SODIUM CHLORIDE 0.9 % IV BOLUS (SEPSIS)
500.0000 mL | Freq: Once | INTRAVENOUS | Status: AC
  Administered 2018-02-21: 500 mL via INTRAVENOUS

## 2018-02-21 NOTE — Discharge Instructions (Addendum)
Please take 1-2 meclizine tablets every 6 hours for the next 24 hours. Contact a health care provider if: Your medicines do not relieve your vertigo or they make it worse. You have a fever. Your condition gets worse or you develop new symptoms. Your family or friends notice any behavioral changes. Your nausea or vomiting gets worse. You have numbness or a ?pins and needles? sensation in part of your body. Get help right away if: You have difficulty moving or speaking. You are always dizzy. You faint. You develop severe headaches. You have weakness in your hands, arms, or legs. You have changes in your hearing or vision. You develop a stiff neck. You develop sensitivity to light.

## 2018-02-21 NOTE — ED Provider Notes (Addendum)
[facility] DEPT Provider Note   CSN: [id number] Arrival date & time: 02/21/18  1800     History   Chief Complaint Chief Complaint  Patient presents with  . Dizziness    HPI Wanda Rivera is a 77 y.o. female who presents the emergency department chief complaint of vertigo.  She is a past medical history of intermittent peripheral vertigo.  Patient states that around 10 AM this morning she had onset of room spinning sensation.  She thought she might be dehydrated so she ate some food and drink water and her symptoms improved after about 1 hour.  She ate lunch about 2 PM and then 1 hour later her symptoms came on again.  She states she had associated nausea which is abnormal for her with vertigo along with ataxia which is also abnormal for her.  She took one meclizine tablet around 3 PM and states that her symptoms did not improve.  She went to an urgent care prior to arrival who sent her to the ER.  She states that her symptoms are worse with movement better with rest.  She feels somewhat off balance with walking but is able to ambulate.  Patient states that she is unable to take benzodiazepines because it "does not agree with my system."  She denies any visual changes, unilateral weakness, difficulty with speech or swallowing, headaches.  HPI  Past Medical History:  Diagnosis Date  . 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 Breast cancer ([place])    1982, recurrence in 1985  . Cataract    Bil  . Glaucoma    Bil  . Osteoporosis     Patient Active Problem List   Diagnosis Date Noted  . GERD (gastroesophageal reflux disease) 12/13/2017  . History of breast cancer 01/02/2017  . Compression fracture of L3 lumbar vertebra ([place]) 05/04/2016  . Hyperlipidemia 08/16/2015  . Left carotid bruit 08/16/2015  . 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 Routine general medical examination at a health care facility 12/09/2014  . Osteopenia  12/09/2014  . Glaucoma 12/09/2014    Past Surgical History:  Procedure Laterality Date  . ABDOMINAL HYSTERECTOMY    . BREAST SURGERY     double masectomy for breast cancer  . eye surgery Bilateral 06/05/2017   for Glaucoma    OB History    No data available       Home Medications    Prior to Admission medications   Medication Sig Start Date End Date Taking? Authorizing Provider  acetaminophen (TYLENOL) 500 MG tablet Take 1,000 mg by mouth every 6 (six) hours as needed for moderate pain or headache.    Yes [provider]  BIOTIN PO Take 1,000 mcg by mouth 2 (two) times daily.    Yes [provider]  Calcium Carb-Cholecalciferol (CALCIUM + D3) 600-200 MG-UNIT TABS Take 1 tablet by mouth 2 (two) times daily.    Yes [provider]  Cholecalciferol (VITAMIN D3) 400 UNITS CAPS Take 400 Units by mouth daily.    Yes [provider]  Cyanocobalamin (VITAMIN B 12 PO) Take 500 mg by mouth daily.   Yes [provider]  dorzolamide-timolol (COSOPT) 22.3-6.8 MG/ML ophthalmic solution Place 1 drop into both eyes 2 (two) times daily.  12/03/14  Yes [provider]  latanoprost (XALATAN) 0.005 % ophthalmic solution Place 1 drop into both eyes at bedtime.  12/28/17  Yes [provider]  Multiple Vitamin (ONE-A-DAY ESSENTIAL PO) Take by  mouth daily.   Yes [provider]  Omega-3 Fat Ac-Cholecalciferol (OMEGA ESSENTIALS/VIT D3) LIQD Take 5 mLs by mouth daily.    Yes [provider]  omeprazole (PRILOSEC) 40 MG capsule Take 1 capsule (40 mg total) by mouth daily. Patient taking differently: Take 40 mg by mouth 2 (two) times daily.  02/14/17  Yes Levin Erp, PA  prednisoLONE acetate (PRED FORTE) 1 % ophthalmic suspension Place 1 drop into both eyes 4 (four) times daily.  01/28/18  Yes [provider]  Turmeric 500 MG TABS Take by mouth daily.   Yes [provider]  DIGESTIVE ENZYMES PO Take 1  capsule by mouth daily.     [provider]  meclizine (ANTIVERT) 12.5 MG tablet Take 12.5 mg by mouth 3 (three) times daily as needed for dizziness.     [provider]    Family History Family History  Problem Relation Age of Onset  . Cancer Mother   . Breast cancer Mother   . Liver cancer Mother   . Diabetes Father   . Hypertension Father   . Hypertension Sister   . Diabetes Brother   . Heart disease Brother     Social History Social History   Tobacco Use  . Smoking status: Never Smoker  . Smokeless tobacco: Never Used  Substance Use Topics  . Alcohol use: Yes    Alcohol/week: 0.0 oz    Comment: occasionally a glass of red wine  . Drug use: No     Allergies   Darvon [propoxyphene]; Morphine and related; Percocet [oxycodone-acetaminophen]; and Vicodin [hydrocodone-acetaminophen]   Review of Systems Review of Systems  Ten systems reviewed and are negative for acute change, except as noted in the HPI.   Physical Exam Updated Vital Signs BP (!) 188/76 (BP Location: Left Arm)   Pulse 61   Temp 98.3 F (36.8 C) (Oral)   Resp 16   SpO2 100%   Physical Exam  Constitutional: She is oriented to person, place, and time. She appears well-developed and well-nourished. No distress.  HENT:  Head: Normocephalic and atraumatic.  Mouth/Throat: Oropharynx is clear and moist.  Eyes: Conjunctivae and EOM are normal. Pupils are equal, round, and reactive to light. No scleral icterus.  Horizontal nystagmus to the right only with right gaze.  No other abnormalities  Neck: Normal range of motion. Neck supple.  Full active and passive ROM without pain No midline or paraspinal tenderness No nuchal rigidity or meningeal signs  Cardiovascular: Normal rate, regular rhythm and intact distal pulses.  Pulmonary/Chest: Effort normal and breath sounds normal. No respiratory distress. She has no wheezes. She has no rales.  Abdominal: Soft. Bowel sounds are normal. There  is no tenderness. There is no rebound and no guarding.  Musculoskeletal: Normal range of motion.  Lymphadenopathy:    She has no cervical adenopathy.  Neurological: She is alert and oriented to person, place, and time. No cranial nerve deficit. She exhibits normal muscle tone. Coordination normal.  Mental Status:  Alert, oriented, thought content appropriate. Speech fluent without evidence of aphasia. Able to follow 2 step commands without difficulty.  Cranial Nerves:  II:  Peripheral visual fields grossly normal, pupils equal, round, reactive to light III,IV, VI: ptosis not present, extra-ocular motions intact bilaterally  V,VII: smile symmetric, facial light touch sensation equal VIII: hearing grossly normal bilaterally  IX,X: midline uvula rise  XI: bilateral shoulder shrug equal and strong XII: midline tongue extension  Motor:  5/5 in upper  and lower extremities bilaterally including strong and equal grip strength and dorsiflexion/plantar flexion Sensory: Pinprick and light touch normal in all extremities.  Cerebellar: normal finger-to-nose with bilateral upper extremities Gait: normal gait and balance CV: distal pulses palpable throughout   Skin: Skin is warm and dry. No rash noted. She is not diaphoretic.  Psychiatric: She has a normal mood and affect. Her behavior is normal. Judgment and thought content normal.  Nursing note and vitals reviewed.    ED Treatments / Results  Labs (all labs ordered are listed, but only abnormal results are displayed) Labs Reviewed  BASIC METABOLIC PANEL - Abnormal; Notable for the following components:      Result Value   Glucose, Bld 117 (*)    GFR calc non Af Amer 57 (*)    All other components within normal limits  URINALYSIS, ROUTINE W REFLEX MICROSCOPIC - Abnormal; Notable for the following components:   Specific Gravity, Urine 1.004 (*)    Leukocytes, UA TRACE (*)    All other components within normal limits  CBC  RAPID URINE DRUG  SCREEN, HOSP PERFORMED  URINALYSIS, ROUTINE W REFLEX MICROSCOPIC  ETHANOL  PROTIME-INR  APTT  CBG MONITORING, ED  I-STAT TROPONIN, ED    EKG  EKG Interpretation  Date/Time:  Friday February 21 2018 18:28:57 EST Ventricular Rate:  50 PR Interval:    QRS Duration: 76 QT Interval:  390 QTC Calculation: 356 R Axis:   48 Text Interpretation:  Sinus rhythm Confirmed by Malvin Johns [phone]) on 02/21/2018 6:34:05 PM       Radiology Dg Shoulder Right  Result Date: 02/20/2018 CLINICAL DATA:  Right shoulder pain, no known injury, initial encounter EXAM: RIGHT SHOULDER - 2+ VIEW COMPARISON:  01/10/2017 FINDINGS: Degenerative changes of the acromioclavicular joint are noted and slightly more marked than that seen on the prior exam. Some remodeling of the acromion is noted and narrowing of the subacromial space suggestive of possible rotator cuff injury. No abnormality of the underlying bony thorax is noted. No fracture is seen. Postsurgical changes are noted. IMPRESSION: Degenerative change and findings suggestive of rotator cuff injury. No acute fracture is noted. Electronically Signed   By: Inez Catalina M.D.   On: 02/20/2018 09:17   Ct Head Wo Contrast  Result Date: 02/21/2018 CLINICAL DATA:  Nausea and dizziness onset this morning at 10 a.m. EXAM: CT HEAD WITHOUT CONTRAST TECHNIQUE: Contiguous axial images were obtained from the base of the skull through the vertex without intravenous contrast. COMPARISON:  None. FINDINGS: Brain: Superficial atrophy with chronic appearing minimal small vessel ischemic disease of periventricular and subcortical white matter. No large vascular territory infarct. No intra-axial mass nor extra-axial fluid collections. No hydrocephalus. Midline fourth ventricle and basal cisterns. Vascular: Moderate atherosclerosis of the carotid siphons. No hyperdense vessels. Skull: No skull fracture or suspicious osseous lesions. Sinuses/Orbits: No acute finding. Other: None  IMPRESSION: No acute intracranial abnormality. Mild superficial atrophy with minimal small vessel ischemic disease. Electronically Signed   By: Ashley Royalty M.D.   On: 02/21/2018 23:02    Procedures Procedures (including critical care time)  Medications Ordered in ED Medications  meclizine (ANTIVERT) tablet 25 mg (25 mg Oral Given 02/21/18 2258)  sodium chloride 0.9 % bolus 500 mL (500 mLs Intravenous New Bag/Given 02/21/18 2309)     Initial Impression / Assessment and Plan / ED Course  I have reviewed the triage vital signs and the nursing notes.  Pertinent labs & imaging results that were available during my  care of the patient were reviewed by me and considered in my medical decision making (see chart for details).   EKG reviewed, no signs of atrial fibrillation. Patient's physical exam seems to point toward peripheral vertigo given unilateral right-sided nystagmus, normal neurologic exam otherwise, positional vertiginous symptoms.  The patient's symptoms resolved completely and she was able to ambulate to the bathroom with a dose of meclizine.  I had a long conversation with the patient about continued workup from here which would include an MRI of the brain.  We came to a shared decision and the patient feels comfortable going home with a diagnosis of peripheral vertigo.  She understands that should her symptoms return worsen or change she should go directly to [facility] emergency department and may need an MRI at that time.  Currently our MRI machine is not working.  I discussed the case with Dr. Leonides Schanz and the patient has been seen and shared visit.  She appears safe and appropriate for discharge at this time.  Final Clinical Impressions(s) / ED Diagnoses   Final diagnoses:  Vertigo    ED Discharge Orders    None       Margarita Mail, PA-C 02/22/18 [place], [place], PA-C 02/22/18 [place], Delice Bison, DO 02/22/18 [phone]

## 2018-02-21 NOTE — ED Triage Notes (Signed)
Patient c/o nausea and dizziness onset of 10am today. Patient states she thought it was related to not eating so she ate, felt better but dizziness came back. Took meclizine around 2pm due to hx of vertigo but medicine has not helped. No neuro deficits noted in triage.

## 2018-04-02 DIAGNOSIS — M12811 Other specific arthropathies, not elsewhere classified, right shoulder: Secondary | ICD-10-CM | POA: Insufficient documentation

## 2018-04-02 DIAGNOSIS — M7541 Impingement syndrome of right shoulder: Secondary | ICD-10-CM | POA: Diagnosis not present

## 2018-04-02 DIAGNOSIS — M25811 Other specified joint disorders, right shoulder: Secondary | ICD-10-CM | POA: Diagnosis not present

## 2018-04-15 ENCOUNTER — Other Ambulatory Visit: Payer: Self-pay | Admitting: Internal Medicine

## 2018-06-17 DIAGNOSIS — H401123 Primary open-angle glaucoma, left eye, severe stage: Secondary | ICD-10-CM | POA: Diagnosis not present

## 2018-07-21 DIAGNOSIS — Z01818 Encounter for other preprocedural examination: Secondary | ICD-10-CM | POA: Diagnosis not present

## 2018-07-21 DIAGNOSIS — H401123 Primary open-angle glaucoma, left eye, severe stage: Secondary | ICD-10-CM | POA: Diagnosis not present

## 2018-07-28 DIAGNOSIS — H409 Unspecified glaucoma: Secondary | ICD-10-CM | POA: Diagnosis not present

## 2018-07-28 DIAGNOSIS — H401123 Primary open-angle glaucoma, left eye, severe stage: Secondary | ICD-10-CM | POA: Diagnosis not present

## 2018-09-10 DIAGNOSIS — Z23 Encounter for immunization: Secondary | ICD-10-CM | POA: Diagnosis not present

## 2018-09-19 DIAGNOSIS — H409 Unspecified glaucoma: Secondary | ICD-10-CM | POA: Diagnosis not present

## 2018-09-19 DIAGNOSIS — Z1501 Genetic susceptibility to malignant neoplasm of breast: Secondary | ICD-10-CM | POA: Diagnosis not present

## 2018-09-19 DIAGNOSIS — Z853 Personal history of malignant neoplasm of breast: Secondary | ICD-10-CM | POA: Diagnosis not present

## 2018-12-17 ENCOUNTER — Ambulatory Visit (INDEPENDENT_AMBULATORY_CARE_PROVIDER_SITE_OTHER): Payer: Medicare Other | Admitting: Internal Medicine

## 2018-12-17 ENCOUNTER — Other Ambulatory Visit (INDEPENDENT_AMBULATORY_CARE_PROVIDER_SITE_OTHER): Payer: Medicare Other

## 2018-12-17 ENCOUNTER — Encounter: Payer: Self-pay | Admitting: Internal Medicine

## 2018-12-17 ENCOUNTER — Ambulatory Visit (INDEPENDENT_AMBULATORY_CARE_PROVIDER_SITE_OTHER): Payer: Medicare Other | Admitting: *Deleted

## 2018-12-17 VITALS — BP 112/70 | HR 51 | Ht 64.0 in | Wt 133.0 lb

## 2018-12-17 VITALS — BP 112/70 | HR 51 | Temp 98.3°F | Ht 64.0 in | Wt 133.0 lb

## 2018-12-17 DIAGNOSIS — E782 Mixed hyperlipidemia: Secondary | ICD-10-CM

## 2018-12-17 DIAGNOSIS — K5900 Constipation, unspecified: Secondary | ICD-10-CM

## 2018-12-17 DIAGNOSIS — R059 Cough, unspecified: Secondary | ICD-10-CM

## 2018-12-17 DIAGNOSIS — R05 Cough: Secondary | ICD-10-CM | POA: Diagnosis not present

## 2018-12-17 DIAGNOSIS — Z0000 Encounter for general adult medical examination without abnormal findings: Secondary | ICD-10-CM

## 2018-12-17 DIAGNOSIS — E2839 Other primary ovarian failure: Secondary | ICD-10-CM | POA: Diagnosis not present

## 2018-12-17 DIAGNOSIS — K219 Gastro-esophageal reflux disease without esophagitis: Secondary | ICD-10-CM

## 2018-12-17 LAB — CBC
HCT: 42.8 % (ref 36.0–46.0)
HEMOGLOBIN: 14.7 g/dL (ref 12.0–15.0)
MCHC: 34.3 g/dL (ref 30.0–36.0)
MCV: 93.9 fl (ref 78.0–100.0)
PLATELETS: 201 10*3/uL (ref 150.0–400.0)
RBC: 4.56 Mil/uL (ref 3.87–5.11)
RDW: 13.8 % (ref 11.5–15.5)
WBC: 6.8 10*3/uL (ref 4.0–10.5)

## 2018-12-17 LAB — COMPREHENSIVE METABOLIC PANEL
ALBUMIN: 4.3 g/dL (ref 3.5–5.2)
ALT: 23 U/L (ref 0–35)
AST: 24 U/L (ref 0–37)
Alkaline Phosphatase: 91 U/L (ref 39–117)
BILIRUBIN TOTAL: 0.7 mg/dL (ref 0.2–1.2)
BUN: 14 mg/dL (ref 6–23)
CALCIUM: 9.8 mg/dL (ref 8.4–10.5)
CO2: 31 mEq/L (ref 19–32)
Chloride: 106 mEq/L (ref 96–112)
Creatinine, Ser: 0.99 mg/dL (ref 0.40–1.20)
GFR: 70.09 mL/min (ref >60.00)
Glucose, Bld: 100 mg/dL — ABNORMAL HIGH (ref 70–99)
Potassium: 4 mEq/L (ref 3.5–5.1)
Sodium: 143 mEq/L (ref 135–145)
TOTAL PROTEIN: 7.4 g/dL (ref 6.0–8.3)

## 2018-12-17 LAB — LIPID PANEL
CHOLESTEROL: 210 mg/dL — AB (ref 0–200)
HDL: 52.2 mg/dL (ref >39.00)
LDL Cholesterol: 141 mg/dL — ABNORMAL HIGH (ref 0–99)
NonHDL: 157.52
TRIGLYCERIDES: 84 mg/dL (ref 0.0–149.0)
Total CHOL/HDL Ratio: 4
VLDL: 16.8 mg/dL (ref 0.0–40.0)

## 2018-12-17 MED ORDER — DOXYCYCLINE HYCLATE 100 MG PO TABS: 100.0000 mg | ORAL_TABLET | Freq: Two times a day (BID) | ORAL | 0 refills | Status: DC

## 2018-12-17 MED ORDER — POLYETHYLENE GLYCOL 3350 17 GM/SCOOP PO POWD: 17.0000 g | Freq: Two times a day (BID) | ORAL | 11 refills | Status: DC | PRN

## 2018-12-17 NOTE — Progress Notes (Signed)
Subjective:   Wanda Rivera is a 77 y.o. female who presents for Medicare Annual (Subsequent) preventive examination.  Review of Systems:  No ROS.  Medicare Wellness Visit. Additional risk factors are reflected in the social history.  Cardiac Risk Factors include: advanced age (>49men, >56 women);dyslipidemia Sleep patterns: has interrupted sleep, gets up 1-2 times nightly to void and sleeps 5-6 hours nightly. Patient reports insomnia issues, discussed recommended sleep tips and stress reduction tips. Relevant patient education assigned to patient using Emmi.  Home Safety/Smoke Alarms: Feels safe in home. Smoke alarms in place.  Living environment; residence and Firearm Safety: 2-story house, no firearms. Lives alone, no needs for DME, good support system Seat Belt Safety/Bike Helmet: Wears seat belt.     Objective:     Vitals: BP 112/70   Pulse (!) 51   Ht 5\' 4"  (1.626 m)   Wt 133 lb (60.3 kg)   SpO2 97%   BMI 22.83 kg/m   Body mass index is 22.83 kg/m.  Advanced Directives 12/17/2018 02/21/2018 03/20/2016 03/13/2016  Does Patient Have a Medical Advance Directive? Yes No No No  Type of Paramedic of [place];Living will - - -  Copy of [place] in Chart? No - copy requested - - -  Would patient like information on creating a medical advance directive? - No - Patient declined - -    Tobacco Social History   Tobacco Use  Smoking Status Never Smoker  Smokeless Tobacco Never Used     Counseling given: Not Answered  Past Medical History:  Diagnosis Date  . 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 Breast cancer ([place])    1982, recurrence in 1985  . Cataract    Bil  . Glaucoma    Bil  . Osteoporosis    Past Surgical History:  Procedure Laterality Date  . ABDOMINAL HYSTERECTOMY    . BREAST SURGERY     double masectomy for breast cancer  . eye surgery Bilateral 06/05/2017   for Glaucoma   Family History  Problem Relation Age  of Onset  . Cancer Mother   . Breast cancer Mother   . Liver cancer Mother   . Diabetes Father   . Hypertension Father   . Hypertension Sister   . Diabetes Brother   . Heart disease Brother    Social History   Socioeconomic History  . Marital status: Divorced    Spouse name: Not on file  . Number of children: 1  . Years of education: Not on file  . Highest education level: Not on file  Occupational History  . Not on file  Social Needs  . Financial resource strain: Not hard at all  . Food insecurity:    Worry: Never true    Inability: Never true  . Transportation needs:    Medical: No    Non-medical: No  Tobacco Use  . Smoking status: Never Smoker  . Smokeless tobacco: Never Used  Substance and Sexual Activity  . Alcohol use: Yes    Alcohol/week: 0.0 standard drinks    Comment: occasionally a glass of red wine  . Drug use: No  . Sexual activity: Never  Lifestyle  . Physical activity:    Days per week: 3 days    Minutes per session: 50 min  . Stress: Not at all  Relationships  . Social connections:    Talks on phone: More than three times a week    Gets together:  More than three times a week    Attends religious service: More than 4 times per year    Active member of club or organization: Yes    Attends meetings of clubs or organizations: More than 4 times per year    Relationship status: Divorced  Other Topics Concern  . Not on file  Social History Narrative  . Not on file    Outpatient Encounter Medications as of 12/17/2018  Medication Sig  . acetaminophen (TYLENOL) 500 MG tablet Take 1,000 mg by mouth every 6 (six) hours as needed for moderate pain or headache.   Marland Kitchen BIOTIN PO Take 1,000 mcg by mouth 2 (two) times daily.   . Calcium Carb-Cholecalciferol (CALCIUM + D3) 600-200 MG-UNIT TABS Take 1 tablet by mouth 2 (two) times daily.   . Cholecalciferol (VITAMIN D3) 400 UNITS CAPS Take 400 Units by mouth daily.   . Cyanocobalamin (VITAMIN B 12 PO) Take 500  mg by mouth daily.  Marland Kitchen DIGESTIVE ENZYMES PO Take 1 capsule by mouth daily.   . dorzolamide-timolol (COSOPT) 22.3-6.8 MG/ML ophthalmic solution Place 1 drop into both eyes 2 (two) times daily.   Marland Kitchen latanoprost (XALATAN) 0.005 % ophthalmic solution Place 1 drop into both eyes at bedtime.   . meclizine (ANTIVERT) 12.5 MG tablet Take 12.5 mg by mouth 3 (three) times daily as needed for dizziness.   . Multiple Vitamin (ONE-A-DAY ESSENTIAL PO) Take by mouth daily.  . Omega-3 Fat Ac-Cholecalciferol (OMEGA ESSENTIALS/VIT D3) LIQD Take 5 mLs by mouth daily.   Marland Kitchen omeprazole (PRILOSEC) 20 MG capsule TAKE 1 CAPSULE TWICE DAILY  . omeprazole (PRILOSEC) 40 MG capsule Take 1 capsule (40 mg total) by mouth daily. (Patient taking differently: Take 40 mg by mouth 2 (two) times daily. )  . prednisoLONE acetate (PRED FORTE) 1 % ophthalmic suspension Place 1 drop into both eyes 4 (four) times daily.   . Turmeric 500 MG TABS Take by mouth daily.   No facility-administered encounter medications on file as of 12/17/2018.     Activities of Daily Living In your present state of health, do you have any difficulty performing the following activities: 12/17/2018  Hearing? N  Vision? N  Difficulty concentrating or making decisions? N  Walking or climbing stairs? N  Dressing or bathing? N  Doing errands, shopping? N  Preparing Food and eating ? N  Using the Toilet? N  In the past six months, have you accidently leaked urine? N  Do you have problems with loss of bowel control? N  Managing your Medications? N  Managing your Finances? N  Housekeeping or managing your Housekeeping? N  Some recent data might be hidden    Patient Care Team: Hoyt Koch, MD as PCP - General (Internal Medicine) Adrian Prows, MD as Consulting Physician (Cardiology)    Assessment:   This is a routine wellness examination for Wanda Rivera. Physical assessment deferred to PCP.   Exercise Activities and Dietary recommendations Current  Exercise Habits: Home exercise routine, Type of exercise: walking, Time (Minutes): 35, Intensity: Mild, Exercise limited by: None identified  Diet (meal preparation, eat out, water intake, caffeinated beverages, dairy products, fruits and vegetables): in general, a "healthy" diet      Discussed supplementing with Ensure, samples and coupons provided. Encouraged patient to increase daily water and healthy fluid intake.  Goals    . Patient Stated     Increase my physical activity by getting back on my routine of walking and exercising on my eliptical.  Fall Risk Fall Risk  12/17/2018 02/20/2018 02/07/2017 01/10/2017 01/02/2017  Falls in the past year? 0 No No Yes Yes  Number falls in past yr: - - - - 1  Injury with Fall? - - - - Yes    Depression Screen PHQ 2/9 Scores 12/17/2018 02/20/2018 02/07/2017 01/10/2017  PHQ - 2 Score 0 0 0 0     Cognitive Function MMSE - Mini Mental State Exam 12/17/2018  Orientation to time 5  Orientation to Place 5  Registration 3  Attention/ Calculation 4  Recall 2  Language- name 2 objects 2  Language- repeat 1  Language- follow 3 step command 3  Language- read & follow direction 1  Write a sentence 1  Copy design 1  Total score 28        Immunization History  Administered Date(s) Administered  . Influenza Whole 09/30/2014  . Influenza-Unspecified 09/30/2015, 09/14/2016, 09/13/2017, 09/19/2018  . Pneumococcal Conjugate-13 09/30/2015  . Pneumococcal Polysaccharide-23 12/10/2016  . Tdap 12/09/2015  . Zoster 01/01/2012   Screening Tests Health Maintenance  Topic Date Due  . TETANUS/TDAP  12/08/2025  . INFLUENZA VACCINE  Completed  . DEXA SCAN  Completed  . PNA vac Low Risk Adult  Completed      Plan:      Continue doing brain stimulating activities (puzzles, reading, adult coloring books, staying active) to keep memory sharp.   Continue to eat heart healthy diet (full of fruits, vegetables, whole grains, lean protein, water--limit  salt, fat, and sugar intake) and increase physical activity as tolerated.   I have personally reviewed and noted the following in the patient's chart:   . Medical and social history . Use of alcohol, tobacco or illicit drugs  . Current medications and supplements . Functional ability and status . Nutritional status . Physical activity . Advanced directives . List of other physicians . Vitals . Screenings to include cognitive, depression, and falls . Referrals and appointments  In addition, I have reviewed and discussed with patient certain preventive protocols, quality metrics, and best practice recommendations. A written personalized care plan for preventive services as well as general preventive health recommendations were provided to patient.     Michiel Cowboy, RN  12/17/2018

## 2018-12-17 NOTE — Patient Instructions (Addendum)
Continue doing brain stimulating activities (puzzles, reading, adult coloring books, staying active) to keep memory sharp.   Continue to eat heart healthy diet (full of fruits, vegetables, whole grains, lean protein, water--limit salt, fat, and sugar intake) and increase physical activity as tolerated.   Ms. Wanda Rivera , Thank you for taking time to come for your Medicare Wellness Visit. I appreciate your ongoing commitment to your health goals. Please review the following plan we discussed and let me know if I can assist you in the future.   These are the goals we discussed: Goals    . Patient Stated     Increase my physical activity by getting back on my routine of walking and exercising on my eliptical.       This is a list of the screening recommended for you and due dates:  Health Maintenance  Topic Date Due  . Tetanus Vaccine  12/08/2025  . Flu Shot  Completed  . DEXA scan (bone density measurement)  Completed  . Pneumonia vaccines  Completed   Health Maintenance, Female Adopting a healthy lifestyle and getting preventive care can go a long way to promote health and wellness. Talk with your health care provider about what schedule of regular examinations is right for you. This is a good chance for you to check in with your provider about disease prevention and staying healthy. In between checkups, there are plenty of things you can do on your own. Experts have done a lot of research about which lifestyle changes and preventive measures are most likely to keep you healthy. Ask your health care provider for more information. Weight and diet Eat a healthy diet  Be sure to include plenty of vegetables, fruits, low-fat dairy products, and lean protein.  Do not eat a lot of foods high in solid fats, added sugars, or salt.  Get regular exercise. This is one of the most important things you can do for your health. ? Most adults should exercise for at least 150 minutes each week. The  exercise should increase your heart rate and make you sweat (moderate-intensity exercise). ? Most adults should also do strengthening exercises at least twice a week. This is in addition to the moderate-intensity exercise. Maintain a healthy weight  Body mass index (BMI) is a measurement that can be used to identify possible weight problems. It estimates body fat based on height and weight. Your health care provider can help determine your BMI and help you achieve or maintain a healthy weight.  For females 50 years of age and older: ? A BMI below 18.5 is considered underweight. ? A BMI of 18.5 to 24.9 is normal. ? A BMI of 25 to 29.9 is considered overweight. ? A BMI of 30 and above is considered obese. Watch levels of cholesterol and blood lipids  You should start having your blood tested for lipids and cholesterol at 76 years of age, then have this test every 5 years.  You may need to have your cholesterol levels checked more often if: ? Your lipid or cholesterol levels are high. ? You are older than 76 years of age. ? You are at high risk for heart disease. Cancer screening Lung Cancer  Lung cancer screening is recommended for adults 39-38 years old who are at high risk for lung cancer because of a history of smoking.  A yearly low-dose CT scan of the lungs is recommended for people who: ? Currently smoke. ? Have quit within the past 15 years. ?  Have at least a 30-pack-year history of smoking. A pack year is smoking an average of one pack of cigarettes a day for 1 year.  Yearly screening should continue until it has been 15 years since you quit.  Yearly screening should stop if you develop a health problem that would prevent you from having lung cancer treatment. Breast Cancer  Practice breast self-awareness. This means understanding how your breasts normally appear and feel.  It also means doing regular breast self-exams. Let your health care provider know about any changes,  no matter how small.  If you are in your 20s or 30s, you should have a clinical breast exam (CBE) by a health care provider every 1-3 years as part of a regular health exam.  If you are 45 or older, have a CBE every year. Also consider having a breast X-ray (mammogram) every year.  If you have a family history of breast cancer, talk to your health care provider about genetic screening.  If you are at high risk for breast cancer, talk to your health care provider about having an MRI and a mammogram every year.  Breast cancer gene (BRCA) assessment is recommended for women who have family members with BRCA-related cancers. BRCA-related cancers include: ? Breast. ? Ovarian. ? Tubal. ? Peritoneal cancers.  Results of the assessment will determine the need for genetic counseling and BRCA1 and BRCA2 testing. Cervical Cancer Your health care provider may recommend that you be screened regularly for cancer of the pelvic organs (ovaries, uterus, and vagina). This screening involves a pelvic examination, including checking for microscopic changes to the surface of your cervix (Pap test). You may be encouraged to have this screening done every 3 years, beginning at age 30.  For women ages 58-65, health care providers may recommend pelvic exams and Pap testing every 3 years, or they may recommend the Pap and pelvic exam, combined with testing for human papilloma virus (HPV), every 5 years. Some types of HPV increase your risk of cervical cancer. Testing for HPV may also be done on women of any age with unclear Pap test results.  Other health care providers may not recommend any screening for nonpregnant women who are considered low risk for pelvic cancer and who do not have symptoms. Ask your health care provider if a screening pelvic exam is right for you.  If you have had past treatment for cervical cancer or a condition that could lead to cancer, you need Pap tests and screening for cancer for at  least 20 years after your treatment. If Pap tests have been discontinued, your risk factors (such as having a new sexual partner) need to be reassessed to determine if screening should resume. Some women have medical problems that increase the chance of getting cervical cancer. In these cases, your health care provider may recommend more frequent screening and Pap tests. Colorectal Cancer  This type of cancer can be detected and often prevented.  Routine colorectal cancer screening usually begins at 76 years of age and continues through 76 years of age.  Your health care provider may recommend screening at an earlier age if you have risk factors for colon cancer.  Your health care provider may also recommend using home test kits to check for hidden blood in the stool.  A small camera at the end of a tube can be used to examine your colon directly (sigmoidoscopy or colonoscopy). This is done to check for the earliest forms of colorectal cancer.  Routine  screening usually begins at age 64.  Direct examination of the colon should be repeated every 5-10 years through 76 years of age. However, you may need to be screened more often if early forms of precancerous polyps or small growths are found. Skin Cancer  Check your skin from head to toe regularly.  Tell your health care provider about any new moles or changes in moles, especially if there is a change in a mole's shape or color.  Also tell your health care provider if you have a mole that is larger than the size of a pencil eraser.  Always use sunscreen. Apply sunscreen liberally and repeatedly throughout the day.  Protect yourself by wearing long sleeves, pants, a wide-brimmed hat, and sunglasses whenever you are outside. Heart disease, diabetes, and high blood pressure  High blood pressure causes heart disease and increases the risk of stroke. High blood pressure is more likely to develop in: ? People who have blood pressure in the  high end of the normal range (130-139/85-89 mm Hg). ? People who are overweight or obese. ? People who are African American.  If you are 57-61 years of age, have your blood pressure checked every 3-5 years. If you are 6 years of age or older, have your blood pressure checked every year. You should have your blood pressure measured twice-once when you are at a hospital or clinic, and once when you are not at a hospital or clinic. Record the average of the two measurements. To check your blood pressure when you are not at a hospital or clinic, you can use: ? An automated blood pressure machine at a pharmacy. ? A home blood pressure monitor.  If you are between 20 years and 67 years old, ask your health care provider if you should take aspirin to prevent strokes.  Have regular diabetes screenings. This involves taking a blood sample to check your fasting blood sugar level. ? If you are at a normal weight and have a low risk for diabetes, have this test once every three years after 76 years of age. ? If you are overweight and have a high risk for diabetes, consider being tested at a younger age or more often. Preventing infection Hepatitis B  If you have a higher risk for hepatitis B, you should be screened for this virus. You are considered at high risk for hepatitis B if: ? You were born in a country where hepatitis B is common. Ask your health care provider which countries are considered high risk. ? Your parents were born in a high-risk country, and you have not been immunized against hepatitis B (hepatitis B vaccine). ? You have HIV or AIDS. ? You use needles to inject street drugs. ? You live with someone who has hepatitis B. ? You have had sex with someone who has hepatitis B. ? You get hemodialysis treatment. ? You take certain medicines for conditions, including cancer, organ transplantation, and autoimmune conditions. Hepatitis C  Blood testing is recommended for: ? Everyone born  from [birth year] through 1964. ? Anyone with known risk factors for hepatitis C. Sexually transmitted infections (STIs)  You should be screened for sexually transmitted infections (STIs) including gonorrhea and chlamydia if: ? You are sexually active and are younger than 76 years of age. ? You are older than 76 years of age and your health care provider tells you that you are at risk for this type of infection. ? Your sexual activity has changed since you were  last screened and you are at an increased risk for chlamydia or gonorrhea. Ask your health care provider if you are at risk.  If you do not have HIV, but are at risk, it may be recommended that you take a prescription medicine daily to prevent HIV infection. This is called pre-exposure prophylaxis (PrEP). You are considered at risk if: ? You are sexually active and do not regularly use condoms or know the HIV status of your partner(s). ? You take drugs by injection. ? You are sexually active with a partner who has HIV. Talk with your health care provider about whether you are at high risk of being infected with HIV. If you choose to begin PrEP, you should first be tested for HIV. You should then be tested every 3 months for as long as you are taking PrEP. Pregnancy  If you are premenopausal and you may become pregnant, ask your health care provider about preconception counseling.  If you may become pregnant, take 400 to 800 micrograms (mcg) of folic acid every day.  If you want to prevent pregnancy, talk to your health care provider about birth control (contraception). Osteoporosis and menopause  Osteoporosis is a disease in which the bones lose minerals and strength with aging. This can result in serious bone fractures. Your risk for osteoporosis can be identified using a bone density scan.  If you are 69 years of age or older, or if you are at risk for osteoporosis and fractures, ask your health care provider if you should be  screened.  Ask your health care provider whether you should take a calcium or vitamin D supplement to lower your risk for osteoporosis.  Menopause may have certain physical symptoms and risks.  Hormone replacement therapy may reduce some of these symptoms and risks. Talk to your health care provider about whether hormone replacement therapy is right for you. Follow these instructions at home:  Schedule regular health, dental, and eye exams.  Stay current with your immunizations.  Do not use any tobacco products including cigarettes, chewing tobacco, or electronic cigarettes.  If you are pregnant, do not drink alcohol.  If you are breastfeeding, limit how much and how often you drink alcohol.  Limit alcohol intake to no more than 1 drink per day for nonpregnant women. One drink equals 12 ounces of beer, 5 ounces of wine, or 1 ounces of hard liquor.  Do not use street drugs.  Do not share needles.  Ask your health care provider for help if you need support or information about quitting drugs.  Tell your health care provider if you often feel depressed.  Tell your health care provider if you have ever been abused or do not feel safe at home. This information is not intended to replace advice given to you by your health care provider. Make sure you discuss any questions you have with your health care provider. Document Released: 07/02/2011 Document Revised: 05/24/2016 Document Reviewed: 09/20/2015 Elsevier Interactive Patient Education  2019 [employer].

## 2018-12-17 NOTE — Progress Notes (Signed)
   Subjective:    Patient ID: Wanda Rivera, female    DOB: 19-Jan-1941, 77 y.o.   MRN: [id number]  HPI The patient is a 77 YO female coming in for new cough (started about 2 weeks or so ago, denies fevers but some chills, some sinus congestion but denies pain or pressure, denies SOB, overall worsening, tried otc cold medicine without relief) and constipation (chronic and she has irregular BMs, has tried several things over the counter including dulcolax which did not help, tries to get fiber in diet, tries to drink water, colon cancer screening up to date) and follow up of GERD (taking omeprazole daily, if missing doses she gets moderate symptoms, rare flares on medicine, denies dark stool or blood in stool).   Review of Systems  Constitutional: Positive for activity change, appetite change and chills. Negative for fatigue, fever and unexpected weight change.  HENT: Positive for congestion, postnasal drip, rhinorrhea and sinus pressure. Negative for ear discharge, ear pain, sinus pain, sneezing, sore throat, tinnitus, trouble swallowing and voice change.   Eyes: Negative.   Respiratory: Positive for cough. Negative for chest tightness, shortness of breath and wheezing.   Cardiovascular: Negative.  Negative for chest pain, palpitations and leg swelling.  Gastrointestinal: Negative.  Negative for abdominal distention, abdominal pain, constipation, diarrhea, nausea and vomiting.  Musculoskeletal: Positive for myalgias.  Skin: Negative.   Neurological: Negative.   Psychiatric/Behavioral: Negative.       Objective:   Physical Exam Constitutional:      Appearance: She is well-developed.  HENT:     Head: Normocephalic and atraumatic.     Comments: Oropharynx with redness and clear drainage, nose with swollen turbinates, TMs normal bilaterally.  Neck:     Musculoskeletal: Normal range of motion.     Thyroid: No thyromegaly.  Cardiovascular:     Rate and Rhythm: Normal rate and regular rhythm.    Pulmonary:     Effort: Pulmonary effort is normal. No respiratory distress.     Breath sounds: Normal breath sounds. No wheezing or rales.  Abdominal:     General: Bowel sounds are normal. There is no distension.     Palpations: Abdomen is soft.     Tenderness: There is no abdominal tenderness. There is no rebound.  Musculoskeletal:        General: Tenderness present.  Lymphadenopathy:     Cervical: No cervical adenopathy.  Skin:    General: Skin is warm and dry.  Neurological:     Mental Status: She is alert and oriented to person, place, and time.     Coordination: Coordination normal.    Vitals:   12/17/18 0938  BP: 112/70  Pulse: (!) 51  Temp: 98.3 F (36.8 C)  TempSrc: Oral  SpO2: 97%  Weight: 133 lb (60.3 kg)  Height: 5\' 4"  (1.626 m)      Assessment & Plan:

## 2018-12-17 NOTE — Patient Instructions (Addendum)
We have sent in the doxycycline to take 1 pill twice a day for 1 week.

## 2018-12-18 ENCOUNTER — Ambulatory Visit (INDEPENDENT_AMBULATORY_CARE_PROVIDER_SITE_OTHER)
Admission: RE | Admit: 2018-12-18 | Discharge: 2018-12-18 | Disposition: A | Discharge status: Home / Self Care | Payer: Medicare Other | Source: Ambulatory Visit | Attending: Internal Medicine | Admitting: Internal Medicine

## 2018-12-18 DIAGNOSIS — E2839 Other primary ovarian failure: Secondary | ICD-10-CM

## 2018-12-18 NOTE — Progress Notes (Signed)
Medical screening examination/treatment/procedure(s) were performed by non-physician practitioner and as supervising physician I was immediately available for consultation/collaboration. I agree with above. Raffaella Edison A Brewster Wolters, MD 

## 2018-12-19 DIAGNOSIS — R05 Cough: Secondary | ICD-10-CM | POA: Insufficient documentation

## 2018-12-19 DIAGNOSIS — K5900 Constipation, unspecified: Secondary | ICD-10-CM | POA: Insufficient documentation

## 2018-12-19 DIAGNOSIS — R059 Cough, unspecified: Secondary | ICD-10-CM | POA: Insufficient documentation

## 2018-12-19 NOTE — Assessment & Plan Note (Signed)
Taking omeprazole and refill. No signs to suggest need for EGD.

## 2018-12-19 NOTE — Assessment & Plan Note (Signed)
Rx for doxycycline given duration and worsening.

## 2018-12-19 NOTE — Assessment & Plan Note (Signed)
Checking lipid panel and adjust as needed taking FA.

## 2018-12-19 NOTE — Assessment & Plan Note (Signed)
Rx for miralax for constipation and talked to her about increasing fluids, exercise, and fiber rich foods.

## 2018-12-25 ENCOUNTER — Ambulatory Visit (INDEPENDENT_AMBULATORY_CARE_PROVIDER_SITE_OTHER)
Admission: RE | Admit: 2018-12-25 | Discharge: 2018-12-25 | Disposition: A | Discharge status: Home / Self Care | Payer: Medicare Other | Source: Ambulatory Visit | Attending: Family Medicine | Admitting: Family Medicine

## 2018-12-25 ENCOUNTER — Encounter: Payer: Self-pay | Admitting: Family Medicine

## 2018-12-25 ENCOUNTER — Ambulatory Visit (INDEPENDENT_AMBULATORY_CARE_PROVIDER_SITE_OTHER): Payer: Medicare Other | Admitting: Family Medicine

## 2018-12-25 ENCOUNTER — Ambulatory Visit: Payer: Self-pay

## 2018-12-25 VITALS — BP 144/78 | HR 69 | Resp 16 | Wt 134.0 lb

## 2018-12-25 DIAGNOSIS — M1711 Unilateral primary osteoarthritis, right knee: Secondary | ICD-10-CM | POA: Diagnosis not present

## 2018-12-25 DIAGNOSIS — G8929 Other chronic pain: Secondary | ICD-10-CM

## 2018-12-25 DIAGNOSIS — M25561 Pain in right knee: Principal | ICD-10-CM

## 2018-12-25 MED ORDER — DICLOFENAC SODIUM 2 % TD SOLN: 1.0000 "application " | Freq: Two times a day (BID) | TRANSDERMAL | 3 refills | Status: DC

## 2018-12-25 NOTE — Patient Instructions (Addendum)
Nice to meet you  Take tylenol 650 mg three times a day is the best evidence based medicine we have for arthritis.  Glucosamine sulfate 750mg  twice a day is a supplement that has been shown to help moderate to severe arthritis. Vitamin D 2000 IU daily Fish oil 2 grams daily.  Tumeric 500mg  twice daily.  Capsaicin topically up to four times a day may also help with pain. Please try the exercises that I have provided  I will call you with the results from today  Please see me back in 2-3 weeks if no better  Happy holidays

## 2018-12-25 NOTE — Progress Notes (Signed)
Wanda Rivera - 76 y.o. female MRN [id number]  Date of birth: 1941-08-31  SUBJECTIVE:  Including CC & ROS.  No chief complaint on file.   Wanda Rivera is a 77 y.o. female that is presenting with acute on chronic right knee pain.  She is experiences pain since her fall 2017.  She had a syncopal episode and had a negative work-up at that point.  The pain is occurring on the posterior lateral aspect of the knee.  She feels pain when she is walking or when she is on the elliptical.  The pain gets bad enough to where she has to stop her workout.  She has not done anything for the knee.  She takes supplements such as turmeric and fish oil.  She reports swelling intermittently.  The pain gets to be moderate.  Pain is sharp and stabbing.  Denies any mechanical symptoms  Independent review of the CT right knee from 2017 shows subcortical cyst in the right lateral femoral condyle likely degenerative in nature.   Review of Systems  Constitutional: Negative for fever.  HENT: Negative for congestion.   Respiratory: Negative for cough.   Cardiovascular: Negative for chest pain.  Gastrointestinal: Negative for abdominal pain.  Musculoskeletal: Positive for arthralgias.  Skin: Negative for color change.  Neurological: Negative for weakness.  Hematological: Negative for adenopathy.  Psychiatric/Behavioral: Negative for agitation.    HISTORY: Past Medical, Surgical, Social, and Family History Reviewed & Updated per EMR.   Pertinent Historical Findings include:  Past Medical History:  Diagnosis Date  . 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 Breast cancer ([place])    1982, recurrence in 1985  . Cataract    Bil  . Glaucoma    Bil  . Osteoporosis     Past Surgical History:  Procedure Laterality Date  . ABDOMINAL HYSTERECTOMY    . BREAST SURGERY     double masectomy for breast cancer  . eye surgery Bilateral 06/05/2017   for Glaucoma    Allergies  Allergen Reactions  . Darvon [Propoxyphene]       Darvocet-Makes her "crazy"  . Morphine And Related     Makes her "crazy"  . Percocet [Oxycodone-Acetaminophen]     Makes her "crazy"  . Vicodin [Hydrocodone-Acetaminophen]     Makes her "crazy"    Family History  Problem Relation Age of Onset  . Cancer Mother   . Breast cancer Mother   . Liver cancer Mother   . Diabetes Father   . Hypertension Father   . Hypertension Sister   . Diabetes Brother   . Heart disease Brother      Social History   Socioeconomic History  . Marital status: Divorced    Spouse name: Not on file  . Number of children: 1  . Years of education: Not on file  . Highest education level: Not on file  Occupational History  . Not on file  Social Needs  . Financial resource strain: Not hard at all  . Food insecurity:    Worry: Never true    Inability: Never true  . Transportation needs:    Medical: No    Non-medical: No  Tobacco Use  . Smoking status: Never Smoker  . Smokeless tobacco: Never Used  Substance and Sexual Activity  . Alcohol use: Yes    Alcohol/week: 0.0 standard drinks    Comment: occasionally a glass of red wine  . Drug use: No  . Sexual activity: Never  Lifestyle  .  Physical activity:    Days per week: 3 days    Minutes per session: 50 min  . Stress: Not at all  Relationships  . Social connections:    Talks on phone: More than three times a week    Gets together: More than three times a week    Attends religious service: More than 4 times per year    Active member of club or organization: Yes    Attends meetings of clubs or organizations: More than 4 times per year    Relationship status: Divorced  . Intimate partner violence:    Fear of current or ex partner: Not on file    Emotionally abused: Not on file    Physically abused: Not on file    Forced sexual activity: Not on file  Other Topics Concern  . Not on file  Social History Narrative  . Not on file     PHYSICAL EXAM:  VS: BP (!) 144/78   Pulse 69    Resp 16   Wt 134 lb (60.8 kg)   SpO2 98%   BMI 23.00 kg/m  Physical Exam Gen: NAD, alert, cooperative with exam, well-appearing ENT: normal lips, normal nasal mucosa,  Eye: normal EOM, normal conjunctiva and lids CV:  no edema, +2 pedal pulses   Resp: no accessory muscle use, non-labored,  Skin: no rashes, no areas of induration  Neuro: normal tone, normal sensation to touch Psych:  normal insight, alert and oriented MSK:  Right Knee: Normal to inspection with no erythema or obvious bony abnormalities. Mild effusion  Palpation normal with no warmth Tenderness to palpation over the lateral joint line and posterior lateral corner.  Some tenderness to palpation over the lateral femoral condyle ROM full in flexion and extension and lower leg rotation. Some instability with valgus and varus testing  Negative Mcmurray's  tests. Non painful patellar compression. Patellar glide without crepitus. Patellar and quadriceps tendons unremarkable. Hamstring and quadriceps strength is normal.  Neurovascularly intact   Limited ultrasound: right knee:  Mild effusion in the suprapatellar pouch. Minimal joint space narrowing of the medial joint line. Lateral joint line with minimal narrowing. Posterior lateral corner with cystic changes  Summary: Findings suggestive of degenerative changes with most notably in the posterior lateral corner.  Ultrasound and interpretation by Clearance Coots, MD       ASSESSMENT & PLAN:   Primary osteoarthritis of right knee Pain seems likely related to degenerative changes.  CT scan from a few years ago showed cystic changes in the lateral femoral condyle.  That is where the majority of her pain is today. -Pennsaid. -Counseled on home exercise therapy and supportive care. -X-ray. -If no improvement will consider injections versus physical therapy

## 2018-12-26 ENCOUNTER — Telehealth: Payer: Self-pay | Admitting: Family Medicine

## 2018-12-26 DIAGNOSIS — M1711 Unilateral primary osteoarthritis, right knee: Secondary | ICD-10-CM | POA: Insufficient documentation

## 2018-12-26 NOTE — Telephone Encounter (Signed)
Informed patient of xray results.   Rosemarie Ax, MD [facility] 12/26/2018, 8:26 AM

## 2018-12-26 NOTE — Assessment & Plan Note (Signed)
Pain seems likely related to degenerative changes.  CT scan from a few years ago showed cystic changes in the lateral femoral condyle.  That is where the majority of her pain is today. -Pennsaid. -Counseled on home exercise therapy and supportive care. -X-ray. -If no improvement will consider injections versus physical therapy

## 2019-01-12 ENCOUNTER — Other Ambulatory Visit: Payer: Self-pay | Admitting: Internal Medicine

## 2019-01-22 DIAGNOSIS — H401111 Primary open-angle glaucoma, right eye, mild stage: Secondary | ICD-10-CM | POA: Diagnosis not present

## 2019-05-03 ENCOUNTER — Ambulatory Visit (HOSPITAL_COMMUNITY): Payer: Medicare Other

## 2019-05-03 ENCOUNTER — Other Ambulatory Visit: Payer: Self-pay

## 2019-05-03 ENCOUNTER — Ambulatory Visit (HOSPITAL_COMMUNITY)
Admission: EM | Admit: 2019-05-03 | Discharge: 2019-05-03 | Disposition: A | Discharge status: Home / Self Care | Payer: Medicare Other | Attending: Emergency Medicine | Admitting: Emergency Medicine

## 2019-05-03 ENCOUNTER — Encounter (HOSPITAL_COMMUNITY): Payer: Self-pay | Admitting: *Deleted

## 2019-05-03 ENCOUNTER — Telehealth: Payer: Self-pay | Admitting: Family Medicine

## 2019-05-03 DIAGNOSIS — K219 Gastro-esophageal reflux disease without esophagitis: Secondary | ICD-10-CM | POA: Diagnosis not present

## 2019-05-03 DIAGNOSIS — R0600 Dyspnea, unspecified: Secondary | ICD-10-CM | POA: Diagnosis not present

## 2019-05-03 LAB — COMPREHENSIVE METABOLIC PANEL
ALT: 17 U/L (ref 0–44)
AST: 24 U/L (ref 15–41)
Albumin: 3.7 g/dL (ref 3.5–5.0)
Alkaline Phosphatase: 88 U/L (ref 38–126)
Anion gap: 8 (ref 5–15)
BUN: 14 mg/dL (ref 8–23)
CO2: 27 mmol/L (ref 22–32)
Calcium: 9.7 mg/dL (ref 8.9–10.3)
Chloride: 105 mmol/L (ref 98–111)
Creatinine, Ser: 1.12 mg/dL — ABNORMAL HIGH (ref 0.44–1.00)
GFR calc Af Amer: 55 mL/min — ABNORMAL LOW (ref >60)
GFR calc non Af Amer: 48 mL/min — ABNORMAL LOW (ref >60)
Glucose, Bld: 92 mg/dL (ref 70–99)
Potassium: 4.3 mmol/L (ref 3.5–5.1)
Sodium: 140 mmol/L (ref 135–145)
Total Bilirubin: 0.8 mg/dL (ref 0.3–1.2)
Total Protein: 6.3 g/dL — ABNORMAL LOW (ref 6.5–8.1)

## 2019-05-03 LAB — CBC
HCT: 39.7 % (ref 36.0–46.0)
Hemoglobin: 13.7 g/dL (ref 12.0–15.0)
MCH: 31.4 pg (ref 26.0–34.0)
MCHC: 34.5 g/dL (ref 30.0–36.0)
MCV: 90.8 fL (ref 80.0–100.0)
Platelets: 190 10*3/uL (ref 150–400)
RBC: 4.37 MIL/uL (ref 3.87–5.11)
RDW: 13.2 % (ref 11.5–15.5)
WBC: 7.1 10*3/uL (ref 4.0–10.5)
nRBC: 0 % (ref 0.0–0.2)

## 2019-05-03 MED ORDER — LIDOCAINE VISCOUS HCL 2 % MT SOLN
OROMUCOSAL | Status: AC
  Filled 2019-05-03: qty 15

## 2019-05-03 MED ORDER — ALUM & MAG HYDROXIDE-SIMETH 200-200-20 MG/5ML PO SUSP
30.0000 mL | Freq: Once | ORAL | Status: AC
  Administered 2019-05-03: 14:00:00 30 mL via ORAL

## 2019-05-03 MED ORDER — DICYCLOMINE HCL 10 MG/5ML PO SOLN: 10.0000 mg | Freq: Once | ORAL | Status: DC

## 2019-05-03 MED ORDER — ALUM & MAG HYDROXIDE-SIMETH 200-200-20 MG/5ML PO SUSP
ORAL | Status: AC
  Filled 2019-05-03: qty 30

## 2019-05-03 MED ORDER — LIDOCAINE VISCOUS HCL 2 % MT SOLN
15.0000 mL | Freq: Once | OROMUCOSAL | Status: AC
  Administered 2019-05-03: 14:00:00 15 mL via ORAL

## 2019-05-03 NOTE — ED Triage Notes (Signed)
C/O intermittent SOB and epigastric "pressure" over past 2 days.  Denies cough, congestion, n/v.  Temp 99 at home.

## 2019-05-03 NOTE — ED Provider Notes (Signed)
[place]    CSN: [id number] Arrival date & time: 05/03/19  1324     History   Chief Complaint Chief Complaint  Patient presents with  . Shortness of Breath  . Abdominal Pain    HPI Wanda Rivera is a 78 y.o. female with a history of GERD presents to the urgent care facility for evaluation of having a hard time taking a deep breath and abdominal pressure.  Patient states that 3 days ago she noticed she felt like she could not take a deep breath.  She denies any fevers cough congestion runny nose or chest pain.  Over the last 2 days she is felt normal with no symptoms but again this morning she started noticing she could not take a deep breath.  She denies any wheezing cough congestion or chest pain.  She also notices abdominal pressure that she states is not painful.  The pressure is noted along her epigastric region and she states she gets relief with burping and ginger ale.  She denies any lower abdominal pain, dysuria, change in urinary frequency, constipation, diarrhea, nausea, vomiting, blood in stools.  No trauma or injury and no rashes.  She denies any back pain or chest pain.  She is currently without pain or discomfort.  She is on a PPI twice daily. HPI  Past Medical History:  Diagnosis Date  . 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 Breast cancer ([place])    1982, recurrence in 1985  . Cataract    Bil  . Glaucoma    Bil  . Osteoporosis     Patient Active Problem List   Diagnosis Date Noted  . Primary osteoarthritis of right knee 12/26/2018  . Cough 12/19/2018  . Constipation 12/19/2018  . GERD (gastroesophageal reflux disease) 12/13/2017  . History of breast cancer 01/02/2017  . Compression fracture of L3 lumbar vertebra 05/04/2016  . Hyperlipidemia 08/16/2015  . Left carotid bruit 08/16/2015  . Atherosclerotic peripheral vascular disease ([place]) 08/06/2015  . Routine general medical examination at a health care facility 12/09/2014  . Osteopenia 12/09/2014   . Glaucoma 12/09/2014    Past Surgical History:  Procedure Laterality Date  . ABDOMINAL HYSTERECTOMY    . BREAST SURGERY     double masectomy for breast cancer  . eye surgery Bilateral 06/05/2017   for Glaucoma    OB History   No obstetric history on file.      Home Medications    Prior to Admission medications   Medication Sig Start Date End Date Taking? Authorizing Provider  acetaminophen (TYLENOL) 500 MG tablet Take 1,000 mg by mouth every 6 (six) hours as needed for moderate pain or headache.    Yes [provider]  BIOTIN PO Take 1,000 mcg by mouth 2 (two) times daily.    Yes [provider]  Cholecalciferol (VITAMIN D3) 400 UNITS CAPS Take 400 Units by mouth daily.    Yes [provider]  Cyanocobalamin (VITAMIN B 12 PO) Take 500 mg by mouth daily.   Yes [provider]  DIGESTIVE ENZYMES PO Take 1 capsule by mouth daily.    Yes [provider]  dorzolamide-timolol (COSOPT) 22.3-6.8 MG/ML ophthalmic solution Place 1 drop into both eyes 2 (two) times daily.  12/03/14  Yes [provider]  latanoprost (XALATAN) 0.005 % ophthalmic solution Place 1 drop into both eyes at bedtime.  12/28/17  Yes [provider]  Multiple Vitamin (ONE-A-DAY ESSENTIAL PO) Take by mouth daily.  Yes [provider]  omeprazole (PRILOSEC) 40 MG capsule Take 1 capsule (40 mg total) by mouth daily. Patient taking differently: Take 40 mg by mouth 2 (two) times daily.  02/14/17  Yes Levin Erp, PA  Turmeric 500 MG TABS Take by mouth daily.   Yes [provider]  Calcium Carb-Cholecalciferol (CALCIUM + D3) 600-200 MG-UNIT TABS Take 1 tablet by mouth 2 (two) times daily.     [provider]  Diclofenac Sodium (PENNSAID) 2 % SOLN Place 1 application onto the skin 2 (two) times daily. 12/25/18   Rosemarie Ax, MD  doxycycline (VIBRA-TABS) 100 MG tablet Take 1 tablet (100 mg total) by mouth 2 (two) times  daily. 12/17/18   Hoyt Koch, MD  meclizine (ANTIVERT) 12.5 MG tablet Take 12.5 mg by mouth 3 (three) times daily as needed for dizziness.     [provider]  Omega-3 Fat Ac-Cholecalciferol (OMEGA ESSENTIALS/VIT D3) LIQD Take 5 mLs by mouth daily.     [provider]  omeprazole (PRILOSEC) 20 MG capsule TAKE 1 CAPSULE TWICE DAILY 01/12/19   Hoyt Koch, MD  polyethylene glycol powder (GLYCOLAX/MIRALAX) powder Take 17 g by mouth 2 (two) times daily as needed. 12/17/18   Hoyt Koch, MD  prednisoLONE acetate (PRED FORTE) 1 % ophthalmic suspension Place 1 drop into both eyes 4 (four) times daily.  01/28/18   [provider]    Family History Family History  Problem Relation Age of Onset  . Cancer Mother   . Breast cancer Mother   . Liver cancer Mother   . Diabetes Father   . Hypertension Father   . Hypertension Sister   . Diabetes Brother   . Heart disease Brother     Social History Social History   Tobacco Use  . Smoking status: Never Smoker  . Smokeless tobacco: Never Used  Substance Use Topics  . Alcohol use: Yes    Alcohol/week: 0.0 standard drinks    Comment: occasionally a glass of red wine  . Drug use: No     Allergies   Darvon [propoxyphene]; Morphine and related; Percocet [oxycodone-acetaminophen]; and Vicodin [hydrocodone-acetaminophen]   Review of Systems Review of Systems  Constitutional: Negative for activity change, chills, fatigue and fever.  HENT: Negative for congestion, sinus pressure, sneezing, sore throat, trouble swallowing and voice change.   Eyes: Negative for visual disturbance.  Respiratory: Negative for cough, chest tightness, shortness of breath, wheezing and stridor.   Cardiovascular: Negative for chest pain and leg swelling.  Gastrointestinal: Negative for abdominal pain, constipation, diarrhea, nausea and vomiting.  Genitourinary: Negative for difficulty urinating and dysuria.   Musculoskeletal: Negative for arthralgias, gait problem and myalgias.  Skin: Negative for rash.  Neurological: Negative for weakness, numbness and headaches.  Hematological: Negative for adenopathy.  Psychiatric/Behavioral: Negative for agitation, behavioral problems and confusion.     Physical Exam Triage Vital Signs ED Triage Vitals  Enc Vitals Group     BP 05/03/19 1338 128/63     Pulse Rate 05/03/19 1338 65     Resp 05/03/19 1338 18     Temp 05/03/19 1338 99.7 F (37.6 C)     Temp Source 05/03/19 1338 Oral     SpO2 05/03/19 1338 96 %     Weight --      Height --      Head Circumference --      Peak Flow --      Pain Score 05/03/19 1339 6  Pain Loc --      Pain Edu? --      Excl. in [place]? --    No data found.  Updated Vital Signs BP 128/63   Pulse 65   Temp 99.7 F (37.6 C) (Oral)   Resp 18   SpO2 96%   Visual Acuity Right Eye Distance:   Left Eye Distance:   Bilateral Distance:    Right Eye Near:   Left Eye Near:    Bilateral Near:     Physical Exam Constitutional:      General: She is not in acute distress.    Appearance: She is well-developed.  HENT:     Head: Normocephalic and atraumatic.     Right Ear: External ear normal.     Left Ear: External ear normal.     Nose: Nose normal.     Mouth/Throat:     Mouth: Mucous membranes are moist.     Pharynx: No oropharyngeal exudate or posterior oropharyngeal erythema.  Eyes:     General:        Right eye: No discharge.        Left eye: No discharge.     Pupils: Pupils are equal, round, and reactive to light.  Neck:     Musculoskeletal: Normal range of motion and neck supple.  Cardiovascular:     Rate and Rhythm: Normal rate and regular rhythm.     Pulses: Normal pulses.     Heart sounds: Normal heart sounds. No murmur.  Pulmonary:     Effort: Pulmonary effort is normal. No respiratory distress.     Breath sounds: Normal breath sounds. No stridor. No wheezing, rhonchi or rales.  Chest:      Chest wall: No tenderness.  Abdominal:     General: Bowel sounds are normal. There is no distension.     Palpations: Abdomen is soft.     Tenderness: There is no abdominal tenderness. There is no guarding.  Musculoskeletal: Normal range of motion.  Skin:    General: Skin is warm and dry.  Neurological:     Mental Status: She is alert and oriented to person, place, and time.     Deep Tendon Reflexes: Reflexes are normal and symmetric.  Psychiatric:        Behavior: Behavior normal.        Thought Content: Thought content normal.      UC Treatments / Results  Labs (all labs ordered are listed, but only abnormal results are displayed) Labs Reviewed  CBC  COMPREHENSIVE METABOLIC PANEL    EKG EKG Interpretation  Date/Time: 05/03/2019  2:19 PM   Ventricular Rate: 60     PR Interval: 152    QRS Duration:  70    QT Interval: 372    QTC Calculation:  372   R Axis:  32   Text Interpretation:   Normal EKG, NSR   Radiology Dg Chest 2 View  Result Date: 05/03/2019 CLINICAL DATA:  Dyspnea EXAM: CHEST - 2 VIEW COMPARISON:  01/10/2017 chest radiograph. FINDINGS: Surgical clips overlie the upper chest bilaterally, unchanged. Stable cardiomediastinal silhouette with normal heart size. No pneumothorax. No pleural effusion. Lungs appear clear, with no acute consolidative airspace disease and no pulmonary edema. IMPRESSION: No active cardiopulmonary disease. Electronically Signed   By: Ilona Sorrel M.D.   On: 05/03/2019 14:23    Procedures Procedures (including critical care time)  Medications Ordered in UC Medications  alum & mag hydroxide-simeth (MAALOX/MYLANTA) 200-200-20 MG/5ML suspension 30  mL (30 mLs Oral Given 05/03/19 1423)    And  lidocaine (XYLOCAINE) 2 % viscous mouth solution 15 mL (15 mLs Oral Given 05/03/19 1423)    Initial Impression / Assessment and Plan / UC Course  I have reviewed the triage vital signs and the nursing notes.  Pertinent labs & imaging results that were  available during my care of the patient were reviewed by me and considered in my medical decision making (see chart for details).    78 year old female with history of gastroesophageal reflux disease presents for evaluation of abdominal pressure without pain.  She admits to getting relief with drinking ginger ale and belching.  Patient without fevers, vital signs stable.  EKG normal.  No chest pain or chest pressure.  No nausea vomiting or diaphoresis.  Patient given GI cocktail with Maalox and viscous lidocaine which resolved her abdominal pressure and sensation of not being able to get air. Patient denies any shortness of breath.  Patient's chest x-ray normal showing no acute cardiopulmonary process.  Patient CBC within normal limits.  CMP with slight elevation of creatinine, 1.12 up from 0.99, patient encouraged to increase fluids.  Patient stable and ready for discharge to home.  She will continue with omeprazole.  She is educated on foods to avoid with GERD.  She is also going to try Maalox 1 to 3 hours after meals and at bedtime as needed.  She will follow-up PCP. Final Clinical Impressions(s) / UC Diagnoses   Final diagnoses:  Gastroesophageal reflux disease without esophagitis     Discharge Instructions     Please continue with omeprazole twice daily.  You may also use Maalox 1 to 3 hours after eating and at bedtime as needed for any stomach pressure.  If you develop any increasing pain, shortness of breath, chest pain or fevers return to the emergency department.    ED Prescriptions    None       Duanne Guess, [place] 05/03/19 1507

## 2019-05-03 NOTE — Discharge Instructions (Addendum)
Please continue with omeprazole twice daily.  You may also use Maalox 1 to 3 hours after eating and at bedtime as needed for any stomach pressure.  If you develop any increasing pain, shortness of breath, chest pain or fevers return to the emergency department.

## 2019-05-03 NOTE — Telephone Encounter (Signed)
She reports that she feels a lot of stress about all the things that are going on with coronavirus She reports that she feels pressures in her abdomen.  She states that it is just above her "belly button". The pain is dull. It has been persistent and she is thinking it might be reflux.  She states that she took a temperature to make sure she did not have any symptoms. She denies fevers or chills. She reports that she feels like if she could pull out something out of her stomach she would feel better.   Denies dizziness, nausea, headache  Advised to get evaluated at [facility] for epigastric pain

## 2019-05-28 DIAGNOSIS — H401111 Primary open-angle glaucoma, right eye, mild stage: Secondary | ICD-10-CM | POA: Diagnosis not present

## 2019-07-01 DIAGNOSIS — H401111 Primary open-angle glaucoma, right eye, mild stage: Secondary | ICD-10-CM | POA: Diagnosis not present

## 2019-08-19 ENCOUNTER — Encounter: Payer: Self-pay | Admitting: Internal Medicine

## 2019-08-19 ENCOUNTER — Ambulatory Visit: Payer: Self-pay | Admitting: *Deleted

## 2019-08-19 ENCOUNTER — Other Ambulatory Visit: Payer: Self-pay

## 2019-08-19 ENCOUNTER — Ambulatory Visit (INDEPENDENT_AMBULATORY_CARE_PROVIDER_SITE_OTHER): Payer: Medicare Other | Admitting: Internal Medicine

## 2019-08-19 VITALS — BP 130/80 | HR 70 | Temp 99.7°F | Ht 64.0 in | Wt 135.0 lb

## 2019-08-19 DIAGNOSIS — R1310 Dysphagia, unspecified: Secondary | ICD-10-CM | POA: Diagnosis not present

## 2019-08-19 DIAGNOSIS — K219 Gastro-esophageal reflux disease without esophagitis: Secondary | ICD-10-CM

## 2019-08-19 MED ORDER — OMEPRAZOLE 40 MG PO CPDR: 40.0000 mg | DELAYED_RELEASE_CAPSULE | Freq: Two times a day (BID) | ORAL | 2 refills | Status: DC

## 2019-08-19 NOTE — Telephone Encounter (Signed)
  Reason for Disposition . [1] Swallowing difficulty AND [2] cause unknown (Exception: difficulty swallowing is a chronic symptom)  Answer Assessment - Initial Assessment Questions 1. SYMPTOM: "Are you having difficulty swallowing liquids, solids, or both?"     Feels like food is "stuck behind left shoulder blade" only after eating.  Initially it happened when drinking or eating when it start 2 weeks, but now only has the sensation after eating.  Denies pain  2. ONSET: "When did the swallowing problems begin?"      2 weeks ago 3. CAUSE: "What do you think is causing the problem?"      Unsure  4. CHRONIC/RECURRENT: "Is this a new problem for you?"  If no, ask: "How long have you had this problem?" (e.g., days, weeks, months)      2 weeks  5. OTHER SYMPTOMS: "Do you have any other symptoms?" (e.g., difficulty breathing, sore throat, swollen tongue, chest pain)     Denies all of the above  6. PREGNANCY: "Is there any chance you are pregnant?" "When was your last menstrual period?"     N/A  Protocols used: SWALLOWING DIFFICULTY-A-AH   Patient states she has been experiencing the sensation that food is getting stuck behind her left shoulder blade when she is eating for the past 2 weeks.  States she is able to drink fluids and the sensation eventually subsides.  The sensation is not painful.  When this started 2 weeks ago, she states she had the sensation with drinking or eating, but now only with eating.  She denies sore throat, swollen tongue or chest pain.  She said she has felt the sensation that food was stuck under her left clavicle bone on occasion in the past week.  The sensation always subsides with drinking liquids, but patient is fearful of eating very much because she does not want to feel like the food is stuck.  Transferred patient's call to PCP office to be scheduled for an appointment within the next 24 hours.  Patient is scheduled for to be seen today at 3:30 pm by Dr. Quay Burow.

## 2019-08-19 NOTE — Assessment & Plan Note (Signed)
?   GERD controlled Dysphagia likely related to GERD Increase prilosec to 40 mg BID for now Refer to Dr Silverio Decamp

## 2019-08-19 NOTE — Patient Instructions (Addendum)
  Medications reviewed and updated.  Changes include :   Start omeprazole 40 mg twice daily.    Your prescription(s) have been submitted to your pharmacy. Please take as directed and contact our office if you believe you are having problem(s) with the medication(s).  A referral was ordered for Dr Silverio Decamp

## 2019-08-19 NOTE — Telephone Encounter (Signed)
Noted  

## 2019-08-19 NOTE — Telephone Encounter (Signed)
Message from Wanda Rivera sent at 08/19/2019 11:30 AM EDT  Patient called to say that she have an issue at times when she eat. She states that when she swallow it feel as if the food is stuck behind her shoulder blade but she states that sometimes after a few swallows it may go away. Patient is concerned asking for a call back at Ph# [phone]

## 2019-08-19 NOTE — Assessment & Plan Note (Signed)
Initially with pills, now only food ? GERD controlled Increase prilosec to 40 mg BID for now Refer to Dr Silverio Decamp  Call if symptoms worse or change prior to seeing GI

## 2019-08-19 NOTE — Progress Notes (Signed)
Subjective:    Patient ID: Wanda Rivera, female    DOB: 26-Dec-1941, 78 y.o.   MRN: [id number]  HPI The patient is here for an acute visit.   Her symptoms started about two weeks ago.  After breakfast she was taking her vitamins and when she swallows them it felt like they stopped in her upper back, near her left scapula.  She kept drinking water and it felt like it was not going to move.  As the day went on everything was ok.  When she ate lunch that day every time she swallowed the food would get stuck and she felt it in her back - just under the left scapula.  Same thing at dinner.  She has continued to experience this daily every time she eats.  She has not noticed it with her pills/vitamins.  On a couple of occasions she has had left arm fatigue and was concerned all of her symptoms were related to a heart problem.  She denies symptoms related to activity.     She chews her food thoroughly.  Sometimes her symptoms are worse than other times. There is no pain, it just feels like it gets stuck.   Drinking water she does not notice it.  Only feels it with eating.     GERD:  She is taking her omeprazole twice daily.   She is compliant with a GERD diet.  She went to urgent care on 5/3 for a flare of her GERD.  She has only occasional GERD symptoms - 1-2 times a month.  She had an EGD 2018 and had gastritis.      Medications and allergies reviewed with patient and updated if appropriate.  Patient Active Problem List   Diagnosis Date Noted  . Primary osteoarthritis of right knee 12/26/2018  . Cough 12/19/2018  . Constipation 12/19/2018  . GERD (gastroesophageal reflux disease) 12/13/2017  . History of breast cancer 01/02/2017  . Compression fracture of L3 lumbar vertebra 05/04/2016  . Hyperlipidemia 08/16/2015  . Left carotid bruit 08/16/2015  . Atherosclerotic peripheral vascular disease ([place]) 08/06/2015  . Routine general medical examination at a health care facility 12/09/2014  .  Osteopenia 12/09/2014  . Glaucoma 12/09/2014    Current Outpatient Medications on File Prior to Visit  Medication Sig Dispense Refill  . acetaminophen (TYLENOL) 500 MG tablet Take 1,000 mg by mouth every 6 (six) hours as needed for moderate pain or headache.     Marland Kitchen BIOTIN PO Take 1,000 mcg by mouth 2 (two) times daily.     . Calcium Carb-Cholecalciferol (CALCIUM + D3) 600-200 MG-UNIT TABS Take 1 tablet by mouth 2 (two) times daily.     . Cholecalciferol (VITAMIN D3) 400 UNITS CAPS Take 400 Units by mouth daily.     . Cyanocobalamin (VITAMIN B 12 PO) Take 500 mg by mouth daily.    . Diclofenac Sodium (PENNSAID) 2 % SOLN Place 1 application onto the skin 2 (two) times daily. 1 Bottle 3  . DIGESTIVE ENZYMES PO Take 1 capsule by mouth daily.     . dorzolamide-timolol (COSOPT) 22.3-6.8 MG/ML ophthalmic solution Place 1 drop into both eyes 2 (two) times daily.     Marland Kitchen latanoprost (XALATAN) 0.005 % ophthalmic solution Place 1 drop into both eyes at bedtime.     . meclizine (ANTIVERT) 12.5 MG tablet Take 12.5 mg by mouth 3 (three) times daily as needed for dizziness.     . Multiple Vitamin (ONE-A-DAY ESSENTIAL PO) Take  by mouth daily.    . Omega-3 Fat Ac-Cholecalciferol (OMEGA ESSENTIALS/VIT D3) LIQD Take 5 mLs by mouth daily.     Marland Kitchen omeprazole (PRILOSEC) 20 MG capsule TAKE 1 CAPSULE TWICE DAILY 180 capsule 2  . omeprazole (PRILOSEC) 40 MG capsule Take 1 capsule (40 mg total) by mouth daily. (Patient taking differently: Take 40 mg by mouth 2 (two) times daily. ) 30 capsule 3  . polyethylene glycol powder (GLYCOLAX/MIRALAX) powder Take 17 g by mouth 2 (two) times daily as needed. 3350 g 11  . prednisoLONE acetate (PRED FORTE) 1 % ophthalmic suspension Place 1 drop into both eyes 4 (four) times daily.     . Turmeric 500 MG TABS Take by mouth daily.     No current facility-administered medications on file prior to visit.     Past Medical History:  Diagnosis Date  . 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  Breast cancer ([place])    1982, recurrence in 1985  . Cataract    Bil  . Glaucoma    Bil  . Osteoporosis     Past Surgical History:  Procedure Laterality Date  . ABDOMINAL HYSTERECTOMY    . BREAST SURGERY     double masectomy for breast cancer  . eye surgery Bilateral 06/05/2017   for Glaucoma    Social History   Socioeconomic History  . Marital status: Divorced    Spouse name: Not on file  . Number of children: 1  . Years of education: Not on file  . Highest education level: Not on file  Occupational History  . Not on file  Social Needs  . Financial resource strain: Not hard at all  . Food insecurity    Worry: Never true    Inability: Never true  . Transportation needs    Medical: No    Non-medical: No  Tobacco Use  . Smoking status: Never Smoker  . Smokeless tobacco: Never Used  Substance and Sexual Activity  . Alcohol use: Yes    Alcohol/week: 0.0 standard drinks    Comment: occasionally a glass of red wine  . Drug use: No  . Sexual activity: Never  Lifestyle  . Physical activity    Days per week: 3 days    Minutes per session: 50 min  . Stress: Not at all  Relationships  . Social connections    Talks on phone: More than three times a week    Gets together: More than three times a week    Attends religious service: More than 4 times per year    Active member of club or organization: Yes    Attends meetings of clubs or organizations: More than 4 times per year    Relationship status: Divorced  Other Topics Concern  . Not on file  Social History Narrative  . Not on file    Family History  Problem Relation Age of Onset  . Cancer Mother   . Breast cancer Mother   . Liver cancer Mother   . Diabetes Father   . Hypertension Father   . Hypertension Sister   . Diabetes Brother   . Heart disease Brother     Review of Systems  Constitutional: Negative for chills and fever.  HENT: Positive for trouble swallowing.   Respiratory: Negative for cough,  shortness of breath and wheezing.   Cardiovascular: Negative for chest pain.  Gastrointestinal: Negative for abdominal pain and nausea.       No increase in burping  Objective:   Vitals:   08/19/19 1524  BP: 130/80  Pulse: 70  Temp: 99.7 F (37.6 C)  SpO2: 96%   BP Readings from Last 3 Encounters:  08/19/19 130/80  05/03/19 128/63  12/25/18 (!) 144/78   Wt Readings from Last 3 Encounters:  08/19/19 135 lb (61.2 kg)  12/25/18 134 lb (60.8 kg)  12/17/18 133 lb (60.3 kg)   Body mass index is 23.17 kg/m.   Physical Exam    Constitutional: Appears well-developed and well-nourished. No distress.  HENT:  Head: Normocephalic and atraumatic.  Neck: Neck supple. No tracheal deviation present. No thyromegaly present.  No cervical lymphadenopathy Cardiovascular: Normal rate, regular rhythm and normal heart sounds.   No murmur heard. No carotid bruit .  No edema Pulmonary/Chest: Effort normal and breath sounds normal. No respiratory distress. No has no wheezes. No rales.  Abdomen: soft, NT, ND MSK: no tenderness in back near scapula with palpation Skin: Skin is warm and dry. Not diaphoretic.  Psychiatric: Normal mood and affect. Behavior is normal.     DG Chest 2 View CLINICAL DATA:  Dyspnea  EXAM: CHEST - 2 VIEW  COMPARISON:  01/10/2017 chest radiograph.  FINDINGS: Surgical clips overlie the upper chest bilaterally, unchanged. Stable cardiomediastinal silhouette with normal heart size. No pneumothorax. No pleural effusion. Lungs appear clear, with no acute consolidative airspace disease and no pulmonary edema.  IMPRESSION: No active cardiopulmonary disease.  Electronically Signed   By: Ilona Sorrel M.D.   On: 05/03/2019 14:23    Assessment & Plan:    See Problem List for Assessment and Plan of chronic medical problems.

## 2019-08-25 ENCOUNTER — Ambulatory Visit: Payer: Medicare Other | Admitting: Internal Medicine

## 2019-09-14 DIAGNOSIS — Z23 Encounter for immunization: Secondary | ICD-10-CM | POA: Diagnosis not present

## 2019-09-28 ENCOUNTER — Ambulatory Visit (INDEPENDENT_AMBULATORY_CARE_PROVIDER_SITE_OTHER): Payer: Medicare Other | Admitting: Gastroenterology

## 2019-09-28 ENCOUNTER — Other Ambulatory Visit: Payer: Self-pay

## 2019-09-28 ENCOUNTER — Encounter: Payer: Self-pay | Admitting: Gastroenterology

## 2019-09-28 VITALS — BP 138/66 | HR 72 | Temp 99.1°F | Ht 64.0 in | Wt 139.0 lb

## 2019-09-28 DIAGNOSIS — R1310 Dysphagia, unspecified: Secondary | ICD-10-CM

## 2019-09-28 DIAGNOSIS — R0789 Other chest pain: Secondary | ICD-10-CM

## 2019-09-28 DIAGNOSIS — K219 Gastro-esophageal reflux disease without esophagitis: Secondary | ICD-10-CM | POA: Diagnosis not present

## 2019-09-28 MED ORDER — OMEPRAZOLE 40 MG PO CPDR: 40.0000 mg | DELAYED_RELEASE_CAPSULE | Freq: Every day | ORAL | 3 refills | Status: DC

## 2019-09-28 NOTE — Patient Instructions (Signed)
You have been scheduled for a Barium Esophogram at [facility] (1st floor of the hospital) on 10/07/2019 at 9:30am. Please arrive 15 minutes prior to your appointment for registration. Make certain not to have anything to eat or drink 3 hours prior to your test. If you need to reschedule for any reason, please contact radiology at [phone] to do so. __________________________________________________________________ A barium swallow is an examination that concentrates on views of the esophagus. This tends to be a double contrast exam (barium and two liquids which, when combined, create a gas to distend the wall of the oesophagus) or single contrast (non-ionic iodine based). The study is usually tailored to your symptoms so a good history is essential. Attention is paid during the study to the form, structure and configuration of the esophagus, looking for functional disorders (such as aspiration, dysphagia, achalasia, motility and reflux) EXAMINATION You may be asked to change into a gown, depending on the type of swallow being performed. A radiologist and radiographer will perform the procedure. The radiologist will advise you of the type of contrast selected for your procedure and direct you during the exam. You will be asked to stand, sit or lie in several different positions and to hold a small amount of fluid in your mouth before being asked to swallow while the imaging is performed .In some instances you may be asked to swallow barium coated marshmallows to assess the motility of a solid food bolus. The exam can be recorded as a digital or video fluoroscopy procedure. POST PROCEDURE It will take 1-2 days for the barium to pass through your system. To facilitate this, it is important, unless otherwise directed, to increase your fluids for the next 24-48hrs and to resume your normal diet.  This test typically takes about 30 minutes to perform.  __________________________________________________________________________________   We will send Omeprazole to your pharmacy    Gastroesophageal Reflux Disease, Adult Gastroesophageal reflux (GER) happens when acid from the stomach flows up into the tube that connects the mouth and the stomach (esophagus). Normally, food travels down the esophagus and stays in the stomach to be digested. However, when a person has GER, food and stomach acid sometimes move back up into the esophagus. If this becomes a more serious problem, the person may be diagnosed with a disease called gastroesophageal reflux disease (GERD). GERD occurs when the reflux:  Happens often.  Causes frequent or severe symptoms.  Causes problems such as damage to the esophagus. When stomach acid comes in contact with the esophagus, the acid may cause soreness (inflammation) in the esophagus. Over time, GERD may create small holes (ulcers) in the lining of the esophagus. What are the causes? This condition is caused by a problem with the muscle between the esophagus and the stomach (lower esophageal sphincter, or LES). Normally, the LES muscle closes after food passes through the esophagus to the stomach. When the LES is weakened or abnormal, it does not close properly, and that allows food and stomach acid to go back up into the esophagus. The LES can be weakened by certain dietary substances, medicines, and medical conditions, including:  Tobacco use.  Pregnancy.  Having a hiatal hernia.  Alcohol use.  Certain foods and beverages, such as coffee, chocolate, onions, and peppermint. What increases the risk? You are more likely to develop this condition if you:  Have an increased body weight.  Have a connective tissue disorder.  Use NSAID medicines. What are the signs or symptoms? Symptoms of this condition  include:  Heartburn.  Difficult or painful swallowing.  The feeling of having a lump in the throat.   Abitter taste in the mouth.  Bad breath.  Having a large amount of saliva.  Having an upset or bloated stomach.  Belching.  Chest pain. Different conditions can cause chest pain. Make sure you see your health care provider if you experience chest pain.  Shortness of breath or wheezing.  Ongoing (chronic) cough or a night-time cough.  Wearing away of tooth enamel.  Weight loss. How is this diagnosed? Your health care provider will take a medical history and perform a physical exam. To determine if you have mild or severe GERD, your health care provider may also monitor how you respond to treatment. You may also have tests, including:  A test to examine your stomach and esophagus with a small camera (endoscopy).  A test thatmeasures the acidity level in your esophagus.  A test thatmeasures how much pressure is on your esophagus.  A barium swallow or modified barium swallow test to show the shape, size, and functioning of your esophagus. How is this treated? The goal of treatment is to help relieve your symptoms and to prevent complications. Treatment for this condition may vary depending on how severe your symptoms are. Your health care provider may recommend:  Changes to your diet.  Medicine.  Surgery. Follow these instructions at home: Eating and drinking   Follow a diet as recommended by your health care provider. This may involve avoiding foods and drinks such as: ? Coffee and tea (with or without caffeine). ? Drinks that containalcohol. ? Energy drinks and sports drinks. ? Carbonated drinks or sodas. ? Chocolate and cocoa. ? Peppermint and mint flavorings. ? Garlic and onions. ? Horseradish. ? Spicy and acidic foods, including peppers, chili powder, curry powder, vinegar, hot sauces, and barbecue sauce. ? Citrus fruit juices and citrus fruits, such as oranges, lemons, and limes. ? Tomato-based foods, such as red sauce, chili, salsa, and pizza with red sauce.  ? Fried and fatty foods, such as donuts, french fries, potato chips, and high-fat dressings. ? High-fat meats, such as hot dogs and fatty cuts of red and white meats, such as rib eye steak, sausage, ham, and bacon. ? High-fat dairy items, such as whole milk, butter, and cream cheese.  Eat small, frequent meals instead of large meals.  Avoid drinking large amounts of liquid with your meals.  Avoid eating meals during the 2-3 hours before bedtime.  Avoid lying down right after you eat.  Do not exercise right after you eat. Lifestyle   Do not use any products that contain nicotine or tobacco, such as cigarettes, e-cigarettes, and chewing tobacco. If you need help quitting, ask your health care provider.  Try to reduce your stress by using methods such as yoga or meditation. If you need help reducing stress, ask your health care provider.  If you are overweight, reduce your weight to an amount that is healthy for you. Ask your health care provider for guidance about a safe weight loss goal. General instructions  Pay attention to any changes in your symptoms.  Take over-the-counter and prescription medicines only as told by your health care provider. Do not take aspirin, ibuprofen, or other NSAIDs unless your health care provider told you to do so.  Wear loose-fitting clothing. Do not wear anything tight around your waist that causes pressure on your abdomen.  Raise (elevate) the head of your bed about 6 inches (15 cm).  Avoid bending over if this makes your symptoms worse.  Keep all follow-up visits as told by your health care provider. This is important. Contact a health care provider if:  You have: ? New symptoms. ? Unexplained weight loss. ? Difficulty swallowing or it hurts to swallow. ? Wheezing or a persistent cough. ? A hoarse voice.  Your symptoms do not improve with treatment. Get help right away if you:  Have pain in your arms, neck, jaw, teeth, or back.  Feel  sweaty, dizzy, or light-headed.  Have chest pain or shortness of breath.  Vomit and your vomit looks like blood or coffee grounds.  Faint.  Have stool that is bloody or black.  Cannot swallow, drink, or eat. Summary  Gastroesophageal reflux happens when acid from the stomach flows up into the esophagus. GERD is a disease in which the reflux happens often, causes frequent or severe symptoms, or causes problems such as damage to the esophagus.  Treatment for this condition may vary depending on how severe your symptoms are. Your health care provider may recommend diet and lifestyle changes, medicine, or surgery.  Contact a health care provider if you have new or worsening symptoms.  Take over-the-counter and prescription medicines only as told by your health care provider. Do not take aspirin, ibuprofen, or other NSAIDs unless your health care provider told you to do so.  Keep all follow-up visits as told by your health care provider. This is important. This information is not intended to replace advice given to you by your health care provider. Make sure you discuss any questions you have with your health care provider. Document Released: 09/26/2005 Document Revised: 06/25/2018 Document Reviewed: 06/25/2018 Elsevier Patient Education  [employer].  I appreciate the  opportunity to care for you  Thank You   Harl Bowie , MD

## 2019-09-28 NOTE — Progress Notes (Signed)
Wanda Rivera    [id number]    12/23/1941  Primary Care Physician:Crawford, Real Cons, MD  Referring Physician: Binnie Rail, MD [place],  [place] [id number]   Chief complaint:  GERD, Dysphagia  HPI: 78 year old female with history of arthritis and chronic GERD for follow-up visit Last month she felt food was getting hung up in the middle of the chest. No issue with liquids.  Omeprazole dose was increased to 40 mg twice daily by Dr. Quay Burow and since then she has noticed significant improvement and no longer has dysphagia.  Denies any odynophagia.  No history of food impactions.  Prior to increasing the dose of omeprazole she was taking Mylanta on daily basis. She was in the ER/urgent care May 2020 for atypical chest pain, improved with GI cocktail.  Denies any nausea, vomiting, abdominal pain, melena or bright red blood per rectum   EGD February 20, 2017: Normal esophagus, mild gastritis biopsies negative for H. pylori, duodenal erosions otherwise normal.  Colonoscopy March 13, 2016: No polyps normal exam, recall colonoscopy in 10 years.  Outpatient Encounter Medications as of 09/28/2019  Medication Sig  . acetaminophen (TYLENOL) 500 MG tablet Take 1,000 mg by mouth every 6 (six) hours as needed for moderate pain or headache.   Marland Kitchen BIOTIN PO Take 1,000 mcg by mouth daily.   . Calcium Carb-Cholecalciferol (CALCIUM + D3) 600-200 MG-UNIT TABS Take 1 tablet by mouth 2 (two) times daily.   . Cyanocobalamin (VITAMIN B 12 PO) Take 500 mg by mouth daily.  Marland Kitchen DIGESTIVE ENZYMES PO Take 1 capsule by mouth daily.   . dorzolamide-timolol (COSOPT) 22.3-6.8 MG/ML ophthalmic solution Place 1 drop into both eyes 2 (two) times daily.   Marland Kitchen latanoprost (XALATAN) 0.005 % ophthalmic solution Place 1 drop into both eyes at bedtime.   . meclizine (ANTIVERT) 12.5 MG tablet Take 12.5 mg by mouth 3 (three) times daily as needed for dizziness.   . Multiple Vitamin (ONE-A-DAY ESSENTIAL  PO) Take by mouth daily.  . Omega-3 1000 MG CAPS Take by mouth.  Marland Kitchen omeprazole (PRILOSEC) 40 MG capsule Take 1 capsule (40 mg total) by mouth 2 (two) times daily.  . polyethylene glycol powder (GLYCOLAX/MIRALAX) powder Take 17 g by mouth 2 (two) times daily as needed.  . Turmeric 500 MG TABS Take by mouth daily.  . Cholecalciferol (VITAMIN D3) 400 UNITS CAPS Take 400 Units by mouth daily.   . [DISCONTINUED] Diclofenac Sodium (PENNSAID) 2 % SOLN Place 1 application onto the skin 2 (two) times daily. (Patient not taking: Reported on 09/28/2019)  . [DISCONTINUED] Omega-3 Fat Ac-Cholecalciferol (OMEGA ESSENTIALS/VIT D3) LIQD Take 5 mLs by mouth daily.   . [DISCONTINUED] prednisoLONE acetate (PRED FORTE) 1 % ophthalmic suspension Place 1 drop into both eyes 4 (four) times daily.    No facility-administered encounter medications on file as of 09/28/2019.     Allergies as of 09/28/2019 - Review Complete 09/28/2019  Allergen Reaction Noted  . Darvon [propoxyphene]  02/28/2016  . Morphine and related  02/28/2016  . Percocet [oxycodone-acetaminophen]  02/28/2016  . Vicodin [hydrocodone-acetaminophen]  02/28/2016    Past Medical History:  Diagnosis Date  . 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 Breast cancer ([place])    1982, recurrence in 1985  . Cataract    Bil  . Glaucoma    Bil  . Osteoporosis     Past Surgical History:  Procedure Laterality Date  .  ABDOMINAL HYSTERECTOMY    . BREAST SURGERY     double masectomy for breast cancer  . eye surgery Bilateral 06/05/2017   for Glaucoma    Family History  Problem Relation Age of Onset  . Cancer Mother   . Breast cancer Mother   . Liver cancer Mother   . Diabetes Father   . Hypertension Father   . Hypertension Sister   . Diabetes Brother   . Heart disease Brother     Social History   Socioeconomic History  . Marital status: Divorced    Spouse name: Not on file  . Number of children: 1  . Years of education: Not on file  . Highest  education level: Not on file  Occupational History  . Occupation: retired  Scientific laboratory technician  . Financial resource strain: Not hard at all  . Food insecurity    Worry: Never true    Inability: Never true  . Transportation needs    Medical: No    Non-medical: No  Tobacco Use  . Smoking status: Never Smoker  . Smokeless tobacco: Never Used  Substance and Sexual Activity  . Alcohol use: Yes    Alcohol/week: 0.0 standard drinks    Comment: occasionally a glass of red wine  . Drug use: No  . Sexual activity: Never  Lifestyle  . Physical activity    Days per week: 3 days    Minutes per session: 50 min  . Stress: Not at all  Relationships  . Social connections    Talks on phone: More than three times a week    Gets together: More than three times a week    Attends religious service: More than 4 times per year    Active member of club or organization: Yes    Attends meetings of clubs or organizations: More than 4 times per year    Relationship status: Divorced  . Intimate partner violence    Fear of current or ex partner: Not on file    Emotionally abused: Not on file    Physically abused: Not on file    Forced sexual activity: Not on file  Other Topics Concern  . Not on file  Social History Narrative  . Not on file      Review of systems: Review of Systems  Constitutional: Negative for fever and chills.  HENT: Negative.   Eyes: Negative for blurred vision.  Respiratory: Negative for cough, shortness of breath and wheezing.   Cardiovascular: Negative for chest pain and palpitations.  Gastrointestinal: as per HPI Genitourinary: Negative for dysuria, urgency, frequency and hematuria.  Musculoskeletal: Positive for myalgias, back pain and joint pain.  Skin: Negative for itching and rash.  Neurological: Negative for dizziness, tremors, focal weakness, seizures and loss of consciousness.  Endo/Heme/Allergies: Positive for seasonal allergies.  Psychiatric/Behavioral: Negative  for depression, suicidal ideas and hallucinations.  All other systems reviewed and are negative.   Physical Exam: Vitals:   09/28/19 1325  BP: 138/66  Pulse: 72  Temp: 99.1 F (37.3 C)   Body mass index is 23.86 kg/m. Gen:      No acute distress HEENT:  EOMI, sclera anicteric Neck:     No masses; no thyromegaly Lungs:    Clear to auscultation bilaterally; normal respiratory effort CV:         Regular rate and rhythm; no murmurs Abd:      + bowel sounds; soft, non-tender; no palpable masses, no distension Ext:    No  edema; adequate peripheral perfusion Skin:      Warm and dry; no rash Neuro: alert and oriented x 3 Psych: normal mood and affect  Data Reviewed:  Reviewed labs, radiology imaging, old records and pertinent past GI work up   Assessment and Plan/Recommendations:  78 year old female with history of breast cancer, arthritis and chronic GERD  Intermittent solid dysphagia improved with high-dose PPI, likely secondary to esophageal spasms due to uncontrolled acid reflux Continue omeprazole 40 mg twice daily Discussed antireflux measures in detail including dietary changes and lifestyle modifications  Will schedule barium esophagram to evaluate for possible esophageal rings/stricture Based on findings on barium study, will consider EGD if needed for therapeutic intervention.  Return in 3 months or sooner if needed  25 minutes was spent face-to-face with the patient. Greater than 50% of the time used for counseling as well as treatment plan and follow-up. She had multiple questions which were answered to her satisfaction  K. Denzil Magnuson , MD    CC: Binnie Rail, MD

## 2019-09-30 ENCOUNTER — Encounter: Payer: Self-pay | Admitting: Gastroenterology

## 2019-10-07 ENCOUNTER — Ambulatory Visit (HOSPITAL_COMMUNITY)
Admission: RE | Admit: 2019-10-07 | Discharge: 2019-10-07 | Disposition: A | Discharge status: Home / Self Care | Payer: Medicare Other | Source: Ambulatory Visit | Attending: Gastroenterology | Admitting: Gastroenterology

## 2019-10-07 ENCOUNTER — Other Ambulatory Visit: Payer: Self-pay

## 2019-10-07 ENCOUNTER — Other Ambulatory Visit: Payer: Self-pay | Admitting: Gastroenterology

## 2019-10-07 DIAGNOSIS — R1310 Dysphagia, unspecified: Secondary | ICD-10-CM | POA: Diagnosis not present

## 2019-10-07 DIAGNOSIS — K224 Dyskinesia of esophagus: Secondary | ICD-10-CM | POA: Diagnosis not present

## 2019-10-07 DIAGNOSIS — K219 Gastro-esophageal reflux disease without esophagitis: Secondary | ICD-10-CM

## 2019-10-09 ENCOUNTER — Other Ambulatory Visit: Payer: Self-pay

## 2019-10-09 MED ORDER — OMEPRAZOLE 40 MG PO CPDR: 40.0000 mg | DELAYED_RELEASE_CAPSULE | Freq: Two times a day (BID) | ORAL | 3 refills | Status: DC

## 2019-11-25 ENCOUNTER — Other Ambulatory Visit: Payer: Self-pay

## 2019-12-21 ENCOUNTER — Telehealth: Payer: Self-pay | Admitting: Emergency Medicine

## 2019-12-21 ENCOUNTER — Encounter: Payer: Self-pay | Admitting: Internal Medicine

## 2019-12-21 ENCOUNTER — Other Ambulatory Visit: Payer: Self-pay | Admitting: Internal Medicine

## 2019-12-21 ENCOUNTER — Ambulatory Visit (INDEPENDENT_AMBULATORY_CARE_PROVIDER_SITE_OTHER): Payer: Medicare Other | Admitting: Internal Medicine

## 2019-12-21 ENCOUNTER — Other Ambulatory Visit: Payer: Self-pay

## 2019-12-21 VITALS — BP 126/84 | HR 65 | Temp 98.0°F | Ht 64.0 in | Wt 136.0 lb

## 2019-12-21 DIAGNOSIS — K5904 Chronic idiopathic constipation: Secondary | ICD-10-CM

## 2019-12-21 DIAGNOSIS — E782 Mixed hyperlipidemia: Secondary | ICD-10-CM | POA: Diagnosis not present

## 2019-12-21 DIAGNOSIS — K219 Gastro-esophageal reflux disease without esophagitis: Secondary | ICD-10-CM | POA: Diagnosis not present

## 2019-12-21 DIAGNOSIS — Z0000 Encounter for general adult medical examination without abnormal findings: Secondary | ICD-10-CM | POA: Diagnosis not present

## 2019-12-21 LAB — CBC
HCT: 39.9 % (ref 36.0–46.0)
Hemoglobin: 13.1 g/dL (ref 12.0–15.0)
MCHC: 32.9 g/dL (ref 30.0–36.0)
MCV: 95.4 fl (ref 78.0–100.0)
Platelets: 176 10*3/uL (ref 150.0–400.0)
RBC: 4.18 Mil/uL (ref 3.87–5.11)
RDW: 13.5 % (ref 11.5–15.5)
WBC: 6 10*3/uL (ref 4.0–10.5)

## 2019-12-21 LAB — LIPID PANEL
Cholesterol: 202 mg/dL — ABNORMAL HIGH (ref 0–200)
HDL: 47 mg/dL (ref >39.00)
LDL Cholesterol: 141 mg/dL — ABNORMAL HIGH (ref 0–99)
NonHDL: 155.21
Total CHOL/HDL Ratio: 4
Triglycerides: 73 mg/dL (ref 0.0–149.0)
VLDL: 14.6 mg/dL (ref 0.0–40.0)

## 2019-12-21 LAB — COMPREHENSIVE METABOLIC PANEL
ALT: 16 U/L (ref 0–35)
AST: 24 U/L (ref 0–37)
Albumin: 4 g/dL (ref 3.5–5.2)
Alkaline Phosphatase: 82 U/L (ref 39–117)
BUN: 19 mg/dL (ref 6–23)
CO2: 30 mEq/L (ref 19–32)
Calcium: 9 mg/dL (ref 8.4–10.5)
Chloride: 107 mEq/L (ref 96–112)
Creatinine, Ser: 1.05 mg/dL (ref 0.40–1.20)
GFR: 61.45 mL/min (ref >60.00)
Glucose, Bld: 85 mg/dL (ref 70–99)
Potassium: 4 mEq/L (ref 3.5–5.1)
Sodium: 143 mEq/L (ref 135–145)
Total Bilirubin: 0.6 mg/dL (ref 0.2–1.2)
Total Protein: 6.3 g/dL (ref 6.0–8.3)

## 2019-12-21 MED ORDER — ROSUVASTATIN CALCIUM 10 MG PO TABS: 10.0000 mg | ORAL_TABLET | Freq: Every day | ORAL | 3 refills | Status: DC

## 2019-12-21 NOTE — Assessment & Plan Note (Signed)
Checking lipid panel. Not on medication.

## 2019-12-21 NOTE — Telephone Encounter (Signed)
Pt is aware of her lab results. She is wanting to know if you can call her back to discuss other options or discuss medication. Thanks.

## 2019-12-21 NOTE — Assessment & Plan Note (Signed)
Flu shot up to date. Pneumonia complete. Shingrix counseled. Tetanus due 2026. Colonoscopy aged out prior to recall. Mammogram aged out, pap smear aged out and dexa due 2021. Counseled about sun safety and mole surveillance. Counseled about the dangers of distracted driving. Given 10 year screening recommendations.

## 2019-12-21 NOTE — Assessment & Plan Note (Signed)
Improved with omeprazole BID dosing. Will continue and follow up with GI annually.

## 2019-12-21 NOTE — Patient Instructions (Signed)
Health Maintenance, Female Adopting a healthy lifestyle and getting preventive care are important in promoting health and wellness. Ask your health care provider about:  The right schedule for you to have regular tests and exams.  Things you can do on your own to prevent diseases and keep yourself healthy. What should I know about diet, weight, and exercise? Eat a healthy diet   Eat a diet that includes plenty of vegetables, fruits, low-fat dairy products, and lean protein.  Do not eat a lot of foods that are high in solid fats, added sugars, or sodium. Maintain a healthy weight Body mass index (BMI) is used to identify weight problems. It estimates body fat based on height and weight. Your health care provider can help determine your BMI and help you achieve or maintain a healthy weight. Get regular exercise Get regular exercise. This is one of the most important things you can do for your health. Most adults should:  Exercise for at least 150 minutes each week. The exercise should increase your heart rate and make you sweat (moderate-intensity exercise).  Do strengthening exercises at least twice a week. This is in addition to the moderate-intensity exercise.  Spend less time sitting. Even light physical activity can be beneficial. Watch cholesterol and blood lipids Have your blood tested for lipids and cholesterol at 77 years of age, then have this test every 5 years. Have your cholesterol levels checked more often if:  Your lipid or cholesterol levels are high.  You are older than 77 years of age.  You are at high risk for heart disease. What should I know about cancer screening? Depending on your health history and family history, you may need to have cancer screening at various ages. This may include screening for:  Breast cancer.  Cervical cancer.  Colorectal cancer.  Skin cancer.  Lung cancer. What should I know about heart disease, diabetes, and high blood  pressure? Blood pressure and heart disease  High blood pressure causes heart disease and increases the risk of stroke. This is more likely to develop in people who have high blood pressure readings, are of African descent, or are overweight.  Have your blood pressure checked: ? Every 3-5 years if you are 18-39 years of age. ? Every year if you are 40 years old or older. Diabetes Have regular diabetes screenings. This checks your fasting blood sugar level. Have the screening done:  Once every three years after age 40 if you are at a normal weight and have a low risk for diabetes.  More often and at a younger age if you are overweight or have a high risk for diabetes. What should I know about preventing infection? Hepatitis B If you have a higher risk for hepatitis B, you should be screened for this virus. Talk with your health care provider to find out if you are at risk for hepatitis B infection. Hepatitis C Testing is recommended for:  Everyone born from 1945 through 1965.  Anyone with known risk factors for hepatitis C. Sexually transmitted infections (STIs)  Get screened for STIs, including gonorrhea and chlamydia, if: ? You are sexually active and are younger than 77 years of age. ? You are older than 77 years of age and your health care provider tells you that you are at risk for this type of infection. ? Your sexual activity has changed since you were last screened, and you are at increased risk for chlamydia or gonorrhea. Ask your health care provider if   you are at risk.  Ask your health care provider about whether you are at high risk for HIV. Your health care provider may recommend a prescription medicine to help prevent HIV infection. If you choose to take medicine to prevent HIV, you should first get tested for HIV. You should then be tested every 3 months for as long as you are taking the medicine. Pregnancy  If you are about to stop having your period (premenopausal) and  you may become pregnant, seek counseling before you get pregnant.  Take 400 to 800 micrograms (mcg) of folic acid every day if you become pregnant.  Ask for birth control (contraception) if you want to prevent pregnancy. Osteoporosis and menopause Osteoporosis is a disease in which the bones lose minerals and strength with aging. This can result in bone fractures. If you are 65 years old or older, or if you are at risk for osteoporosis and fractures, ask your health care provider if you should:  Be screened for bone loss.  Take a calcium or vitamin D supplement to lower your risk of fractures.  Be given hormone replacement therapy (HRT) to treat symptoms of menopause. Follow these instructions at home: Lifestyle  Do not use any products that contain nicotine or tobacco, such as cigarettes, e-cigarettes, and chewing tobacco. If you need help quitting, ask your health care provider.  Do not use street drugs.  Do not share needles.  Ask your health care provider for help if you need support or information about quitting drugs. Alcohol use  Do not drink alcohol if: ? Your health care provider tells you not to drink. ? You are pregnant, may be pregnant, or are planning to become pregnant.  If you drink alcohol: ? Limit how much you use to 0-1 drink a day. ? Limit intake if you are breastfeeding.  Be aware of how much alcohol is in your drink. In the [place] one drink equals one 12 oz bottle of beer (355 mL), one 5 oz glass of wine (148 mL), or one 1 oz glass of hard liquor (44 mL). General instructions  Schedule regular health, dental, and eye exams.  Stay current with your vaccines.  Tell your health care provider if: ? You often feel depressed. ? You have ever been abused or do not feel safe at home. Summary  Adopting a healthy lifestyle and getting preventive care are important in promoting health and wellness.  Follow your health care provider's instructions about healthy  diet, exercising, and getting tested or screened for diseases.  Follow your health care provider's instructions on monitoring your cholesterol and blood pressure. This information is not intended to replace advice given to you by your health care provider. Make sure you discuss any questions you have with your health care provider. Document Released: 07/02/2011 Document Revised: 12/10/2018 Document Reviewed: 12/10/2018 Elsevier Patient Education  2020 [employer]  

## 2019-12-21 NOTE — Assessment & Plan Note (Addendum)
Chronic and using dietary changes for now. Previously tried miralax. Does not want chronic medication for this at this time. Colonoscopy up to date and normal.

## 2019-12-21 NOTE — Progress Notes (Signed)
Subjective:   Patient ID: Wanda Rivera, female    DOB: Oct 16, 1941, 78 y.o.   MRN: [id number]  HPI Here for medicare wellness, no new complaints. Please see A/P for status and treatment of chronic medical problems.   HPI #2: Here for follow up of reflux (taking omeprazole BID, denies significant dysphagis which she was having previously, saw GI and swallow study was without signs of stenosis) and constipation (taking prune juice regularly, previously tried miralax and did not like, she does not want to take laxatives all the time, last BM 1-2 days ago, denies abdominal pain, passing gas normally).   Diet: heart healthy  Physical activity: sedentary Depression/mood screen: negative Hearing: intact to whispered voice Visual acuity: grossly normal, performs annual eye exam  ADLs: capable Fall risk: low Home safety: good Cognitive evaluation: intact to orientation, naming, recall and repetition EOL planning: adv directives discussed    Office Visit from 12/21/2019 in [place] at [facility] Total Score  0      I have personally reviewed and have noted 1. The patient's medical and social history - reviewed today no changes 2. Their use of alcohol, tobacco or illicit drugs 3. Their current medications and supplements 4. The patient's functional ability including ADL's, fall risks, home safety risks and hearing or visual impairment. 5. Diet and physical activities 6. Evidence for depression or mood disorders 7. Care team reviewed and updated  Patient Care Team: Hoyt Koch, MD as PCP - General (Internal Medicine) Adrian Prows, MD as Consulting Physician (Cardiology) Past Medical History:  Diagnosis Date  . 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 Breast cancer ([place])    1982, recurrence in 1985  . Cataract    Bil  . Glaucoma    Bil  . Osteoporosis    Past Surgical History:  Procedure Laterality Date  . ABDOMINAL HYSTERECTOMY    . BREAST SURGERY     double masectomy for breast cancer  . eye surgery Bilateral 06/05/2017   for Glaucoma   Family History  Problem Relation Age of Onset  . Cancer Mother   . Breast cancer Mother   . Liver cancer Mother   . Diabetes Father   . Hypertension Father   . Hypertension Sister   . Diabetes Brother   . Heart disease Brother    Review of Systems  Constitutional: Negative.   HENT: Negative.   Eyes: Negative.   Respiratory: Negative for cough, chest tightness and shortness of breath.   Cardiovascular: Negative for chest pain, palpitations and leg swelling.  Gastrointestinal: Negative for abdominal distention, abdominal pain, constipation, diarrhea, nausea and vomiting.  Musculoskeletal: Negative.   Skin: Negative.   Neurological: Negative.   Psychiatric/Behavioral: Negative.     Objective:  Physical Exam Constitutional:      Appearance: She is well-developed.  HENT:     Head: Normocephalic and atraumatic.  Cardiovascular:     Rate and Rhythm: Normal rate and regular rhythm.  Pulmonary:     Effort: Pulmonary effort is normal. No respiratory distress.     Breath sounds: Normal breath sounds. No wheezing or rales.  Abdominal:     General: Bowel sounds are normal. There is no distension.     Palpations: Abdomen is soft.     Tenderness: There is no abdominal tenderness. There is no rebound.  Musculoskeletal:     Cervical back: Normal range of motion.  Skin:    General: Skin is warm and dry.  Neurological:  Mental Status: She is alert and oriented to person, place, and time.     Coordination: Coordination normal.     Vitals:   12/21/19 0836  BP: 126/84  Temp: 98 F (36.7 C)  TempSrc: Oral  Weight: 136 lb (61.7 kg)  Height: 5\' 4"  (1.626 m)   This visit occurred during the SARS-CoV-2 public health emergency.  Safety protocols were in place, including screening questions prior to the visit, additional usage of staff PPE, and extensive cleaning of exam room while observing  appropriate contact time as indicated for disinfecting solutions.   Assessment & Plan:

## 2020-01-20 DIAGNOSIS — H401123 Primary open-angle glaucoma, left eye, severe stage: Secondary | ICD-10-CM | POA: Diagnosis not present

## 2020-01-20 DIAGNOSIS — H401111 Primary open-angle glaucoma, right eye, mild stage: Secondary | ICD-10-CM | POA: Diagnosis not present

## 2020-01-31 ENCOUNTER — Other Ambulatory Visit: Payer: Self-pay

## 2020-01-31 ENCOUNTER — Ambulatory Visit (HOSPITAL_COMMUNITY)
Admission: EM | Admit: 2020-01-31 | Discharge: 2020-01-31 | Disposition: A | Discharge status: Home / Self Care | Payer: Medicare Other | Attending: Physician Assistant | Admitting: Physician Assistant

## 2020-01-31 ENCOUNTER — Encounter (HOSPITAL_COMMUNITY): Payer: Self-pay

## 2020-01-31 DIAGNOSIS — N3000 Acute cystitis without hematuria: Secondary | ICD-10-CM | POA: Insufficient documentation

## 2020-01-31 LAB — POCT URINALYSIS DIP (DEVICE)
Bilirubin Urine: NEGATIVE
Glucose, UA: NEGATIVE mg/dL
Ketones, ur: 80 mg/dL — AB
Nitrite: POSITIVE — AB
Protein, ur: NEGATIVE mg/dL
Specific Gravity, Urine: 1.02 (ref 1.005–1.030)
Urobilinogen, UA: 0.2 mg/dL (ref 0.0–1.0)
pH: 7 (ref 5.0–8.0)

## 2020-01-31 MED ORDER — CEPHALEXIN 500 MG PO CAPS: 500.0000 mg | ORAL_CAPSULE | Freq: Two times a day (BID) | ORAL | 0 refills | Status: AC

## 2020-01-31 NOTE — Discharge Instructions (Signed)
Take the keflex 2 times a day for 5 days  Call in or return to urgent care if not improving in 2-3 days.  We have sent a culture and if we need to change your medications we will notify you.  Go to the emergency department if you have severe belly pain, high fever.

## 2020-01-31 NOTE — ED Triage Notes (Signed)
Patient presents to Urgent Care with complaints of right flank pain since a few days ago, progressively worse. Patient reports last night, a severe yet dull pain hit her and radiates down towards her pelivs.

## 2020-01-31 NOTE — ED Provider Notes (Signed)
[facility]    CSN: [id number] Arrival date & time: 01/31/20  1438      History   Chief Complaint Chief Complaint  Patient presents with  . Flank Pain    HPI Wanda Rivera is a 79 y.o. female.   Patient reports to urgent care today for 3 days of flank pain. She reports right sided flank pain that has been intermittent at a 6/10 since 3 days ago. Last night she noted a "sudden thud" in her right low abdomen. It was very painful and felt like someone hit her there. This pain has been present since that time. She denies painful urination and urgency. She has frequent urination at baseline and is not sure if this has increased. She denies blood in her urine. She denies fever, chills, nausea, vomiting, diarrhea. She does not have a history of kidney stones or UTI.   She does not have a history of diverticulitis. She had a radical hysterectomy with oophorectomy in 2009 in [place].   She has taken no medications for this.       Past Medical History:  Diagnosis Date  . 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 Breast cancer ([place])    1982, recurrence in 1985  . Cataract    Bil  . Glaucoma    Bil  . Osteoporosis     Patient Active Problem List   Diagnosis Date Noted  . Dysphagia 08/19/2019  . Primary osteoarthritis of right knee 12/26/2018  . Cough 12/19/2018  . Constipation 12/19/2018  . GERD (gastroesophageal reflux disease) 12/13/2017  . History of breast cancer 01/02/2017  . Compression fracture of L3 lumbar vertebra 05/04/2016  . Hyperlipidemia 08/16/2015  . Left carotid bruit 08/16/2015  . 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 Routine general medical examination at a health care facility 12/09/2014  . Osteopenia 12/09/2014  . Glaucoma 12/09/2014    Past Surgical History:  Procedure Laterality Date  . ABDOMINAL HYSTERECTOMY    . BREAST SURGERY     double masectomy for breast cancer  . eye surgery Bilateral 06/05/2017   for  Glaucoma    OB History   No obstetric history on file.      Home Medications    Prior to Admission medications   Medication Sig Start Date End Date Taking? Authorizing Provider  acetaminophen (TYLENOL) 500 MG tablet Take 1,000 mg by mouth every 6 (six) hours as needed for moderate pain or headache.     [provider]  BIOTIN PO Take 1,000 mcg by mouth daily.     [provider]  Calcium Carb-Cholecalciferol (CALCIUM + D3) 600-200 MG-UNIT TABS Take 1 tablet by mouth 2 (two) times daily.     [provider]  cephALEXin (KEFLEX) 500 MG capsule Take 1 capsule (500 mg total) by mouth 2 (two) times daily for 5 days. 01/31/20 02/05/20  Payal Stanforth, Marguerita Beards, PA-C  Cholecalciferol (VITAMIN D3) 400 UNITS CAPS Take 400 Units by mouth daily.     [provider]  Cyanocobalamin (VITAMIN B 12 PO) Take 500 mg by mouth daily.    [provider]  DIGESTIVE ENZYMES PO Take 1 capsule by mouth daily.     [provider]  dorzolamide-timolol (COSOPT) 22.3-6.8 MG/ML ophthalmic solution Place 1 drop into both eyes 2 (two) times daily.  12/03/14   [provider]  latanoprost (XALATAN) 0.005 % ophthalmic solution Place 1 drop into both eyes at bedtime.  12/28/17   [provider]  meclizine (ANTIVERT) 12.5 MG tablet Take 12.5 mg by mouth 3 (three) times daily as needed for dizziness.     [provider]  Multiple Vitamin (ONE-A-DAY ESSENTIAL PO) Take by mouth daily.    [provider]  Omega-3 1000 MG CAPS Take by mouth.    [provider]  omeprazole (PRILOSEC) 40 MG capsule Take 1 capsule (40 mg total) by mouth 2 (two) times daily. 10/09/19   Mauri Pole, MD  polyethylene glycol powder (GLYCOLAX/MIRALAX) powder Take 17 g by mouth 2 (two) times daily as needed. 12/17/18   Hoyt Koch, MD  rosuvastatin (CRESTOR) 10 MG tablet Take 1 tablet (10 mg total) by mouth daily. 12/21/19   Hoyt Koch, MD    Turmeric 500 MG TABS Take by mouth daily.    [provider]    Family History Family History  Problem Relation Age of Onset  . Cancer Mother   . Breast cancer Mother   . Liver cancer Mother   . Diabetes Father   . Hypertension Father   . Hypertension Sister   . Diabetes Brother   . Heart disease Brother     Social History Social History   Tobacco Use  . Smoking status: Never Smoker  . Smokeless tobacco: Never Used  Substance Use Topics  . Alcohol use: Yes    Alcohol/week: 0.0 standard drinks    Comment: occasionally a glass of red wine  . Drug use: No     Allergies   Darvon [propoxyphene], Morphine and related, Percocet [oxycodone-acetaminophen], and Vicodin [hydrocodone-acetaminophen]   Review of Systems Review of Systems  Constitutional: Negative for activity change, appetite change, chills, fatigue and fever.  HENT: Negative.   Eyes: Negative.   Respiratory: Negative for cough and shortness of breath.   Cardiovascular: Negative for chest pain and palpitations.  Gastrointestinal: Positive for abdominal pain. Negative for constipation, diarrhea, nausea and vomiting.  Genitourinary: Positive for flank pain, frequency and pelvic pain. Negative for dysuria, hematuria, urgency, vaginal bleeding, vaginal discharge and vaginal pain.  Musculoskeletal: Negative for arthralgias, back pain and myalgias.  Skin: Negative for color change and rash.  Neurological: Negative for seizures, syncope and headaches.  All other systems reviewed and are negative.    Physical Exam Triage Vital Signs ED Triage Vitals  Enc Vitals Group     BP 01/31/20 1512 (!) 155/50     Pulse Rate 01/31/20 1512 67     Resp 01/31/20 1512 16     Temp 01/31/20 1512 98.9 F (37.2 C)     Temp Source 01/31/20 1512 Oral     SpO2 01/31/20 1512 100 %     Weight --      Height --      Head Circumference --      Peak Flow --      Pain Score 01/31/20 1510 7     Pain Loc --      Pain Edu? --       Excl. in [place]? --    No data found.  Updated Vital Signs BP (!) 155/50 (BP Location: Right Arm)   Pulse 67   Temp 98.9 F (37.2 C) (Oral)   Resp 16   SpO2 100%   Visual Acuity Right Eye Distance:   Left Eye Distance:   Bilateral Distance:    Right Eye Near:   Left Eye Near:    Bilateral Near:     Physical Exam Vitals and nursing note reviewed.  Constitutional:      General: She is not in acute distress.    Appearance: She is well-developed. She is ill-appearing. She is not toxic-appearing.  HENT:     Head: Normocephalic and atraumatic.  Eyes:     Conjunctiva/sclera: Conjunctivae normal.  Cardiovascular:     Rate and Rhythm: Normal rate.  Pulmonary:     Effort: Pulmonary effort is normal. No respiratory distress.  Abdominal:     General: Bowel sounds are normal. There is no distension.     Palpations: Abdomen is soft. There is no mass.     Tenderness: There is abdominal tenderness (RLQ quadrant with deep palpation, patient reports 5/10). There is no right CVA tenderness, left CVA tenderness, guarding or rebound.  Musculoskeletal:     Cervical back: Neck supple.     Right lower leg: No edema.     Left lower leg: No edema.  Skin:    General: Skin is warm and dry.     Findings: No rash.  Neurological:     General: No focal deficit present.     Mental Status: She is alert and oriented to person, place, and time.  Psychiatric:        Mood and Affect: Mood normal.        Behavior: Behavior normal.        Thought Content: Thought content normal.        Judgment: Judgment normal.      UC Treatments / Results  Labs (all labs ordered are listed, but only abnormal results are displayed) Labs Reviewed  POCT URINALYSIS DIP (DEVICE) - Abnormal; Notable for the following components:      Result Value   Ketones, ur 80 (*)    Hgb urine dipstick SMALL (*)    Nitrite POSITIVE (*)    Leukocytes,Ua SMALL (*)    All other components within normal limits  URINE CULTURE     EKG   Radiology No results found.  Procedures Procedures (including critical care time)  Medications Ordered in UC Medications - No data to display  Initial Impression / Assessment and Plan / UC Course  I have reviewed the triage vital signs and the nursing notes.  Pertinent labs & imaging results that were available during my care of the patient were reviewed by me and considered in my medical decision making (see chart for details).     #Cystitis - UA with evidence of UTI. Culture sent. Atypical presentation. Low concern for stone or appendicitis given exam and history.  - keflex BID x 5 - return and follow up precautions discussed, she understands - ED precautions discussed  Final Clinical Impressions(s) / UC Diagnoses   Final diagnoses:  Acute cystitis without hematuria     Discharge Instructions     Take the keflex 2 times a day for 5 days  Call in or return to urgent care if not improving in 2-3 days.  We have sent a culture and if we need to change your medications we will notify you.  Go to the emergency department if you have severe belly pain, high fever.      ED Prescriptions    Medication Sig Dispense Auth. Provider   cephALEXin (KEFLEX) 500 MG capsule Take 1 capsule (500 mg total) by mouth 2 (two) times daily for 5 days. 20 capsule Beata Beason, Marguerita Beards, PA-C     PDMP not reviewed this encounter.   Purnell Shoemaker, PA-C 01/31/20 1622

## 2020-02-02 ENCOUNTER — Telehealth (HOSPITAL_COMMUNITY): Payer: Self-pay | Admitting: Emergency Medicine

## 2020-02-02 LAB — URINE CULTURE: Culture: >=100000 — AB

## 2020-02-02 NOTE — Telephone Encounter (Signed)
Urine culture was positive for e coli and was given keflex  at urgent care visit. Attempted to reach pt to see how she was feeling, no answer.

## 2020-02-10 DIAGNOSIS — H401111 Primary open-angle glaucoma, right eye, mild stage: Secondary | ICD-10-CM | POA: Diagnosis not present

## 2020-02-10 DIAGNOSIS — H401123 Primary open-angle glaucoma, left eye, severe stage: Secondary | ICD-10-CM | POA: Diagnosis not present

## 2020-02-10 DIAGNOSIS — H26493 Other secondary cataract, bilateral: Secondary | ICD-10-CM | POA: Diagnosis not present

## 2020-02-18 ENCOUNTER — Ambulatory Visit: Payer: Medicare Other | Admitting: Internal Medicine

## 2020-02-23 ENCOUNTER — Other Ambulatory Visit: Payer: Self-pay

## 2020-02-23 ENCOUNTER — Ambulatory Visit (INDEPENDENT_AMBULATORY_CARE_PROVIDER_SITE_OTHER): Payer: Medicare Other | Admitting: Internal Medicine

## 2020-02-23 ENCOUNTER — Encounter: Payer: Self-pay | Admitting: Internal Medicine

## 2020-02-23 VITALS — BP 126/84 | HR 76 | Temp 98.2°F | Ht 64.0 in | Wt 136.0 lb

## 2020-02-23 DIAGNOSIS — R109 Unspecified abdominal pain: Secondary | ICD-10-CM | POA: Insufficient documentation

## 2020-02-23 DIAGNOSIS — R1011 Right upper quadrant pain: Secondary | ICD-10-CM | POA: Diagnosis not present

## 2020-02-23 LAB — CBC
HCT: 40.5 % (ref 36.0–46.0)
Hemoglobin: 13.4 g/dL (ref 12.0–15.0)
MCHC: 33.2 g/dL (ref 30.0–36.0)
MCV: 94.8 fl (ref 78.0–100.0)
Platelets: 178 10*3/uL (ref 150.0–400.0)
RBC: 4.27 Mil/uL (ref 3.87–5.11)
RDW: 13.6 % (ref 11.5–15.5)
WBC: 6.1 10*3/uL (ref 4.0–10.5)

## 2020-02-23 LAB — POCT URINALYSIS DIPSTICK
Bilirubin, UA: NEGATIVE
Blood, UA: NEGATIVE
Glucose, UA: NEGATIVE
Ketones, UA: NEGATIVE
Leukocytes, UA: NEGATIVE
Nitrite, UA: NEGATIVE
Protein, UA: POSITIVE — AB
Spec Grav, UA: 1.015 (ref 1.010–1.025)
Urobilinogen, UA: 0.2 E.U./dL
pH, UA: 6 (ref 5.0–8.0)

## 2020-02-23 LAB — COMPREHENSIVE METABOLIC PANEL
ALT: 15 U/L (ref 0–35)
AST: 19 U/L (ref 0–37)
Albumin: 4.3 g/dL (ref 3.5–5.2)
Alkaline Phosphatase: 89 U/L (ref 39–117)
BUN: 17 mg/dL (ref 6–23)
CO2: 31 mEq/L (ref 19–32)
Calcium: 9.5 mg/dL (ref 8.4–10.5)
Chloride: 105 mEq/L (ref 96–112)
Creatinine, Ser: 1 mg/dL (ref 0.40–1.20)
GFR: 64.98 mL/min (ref >60.00)
Glucose, Bld: 65 mg/dL — ABNORMAL LOW (ref 70–99)
Potassium: 3.8 mEq/L (ref 3.5–5.1)
Sodium: 140 mEq/L (ref 135–145)
Total Bilirubin: 0.4 mg/dL (ref 0.2–1.2)
Total Protein: 7 g/dL (ref 6.0–8.3)

## 2020-02-23 LAB — LIPASE: Lipase: 48 U/L (ref 11.0–59.0)

## 2020-02-23 NOTE — Assessment & Plan Note (Signed)
U/A done in the office without signs of infection. Checking CBC, CMP, lipase to rule out alternate etiology. If no etiology may need CT abdomen and pelvis to determine etiology.

## 2020-02-23 NOTE — Progress Notes (Signed)
   Subjective:   Patient ID: Wanda Rivera, female    DOB: [birth year], 79 y.o.   MRN: [id number]  HPI The patient is a 79 YO female coming in for right sided upper abdomen and flank pain. Seen in urgent care 01/31/20 and was having this same symptoms as well as some lower abdomen pain. They checked U/A and treated for infection with keflex. She thinks that this helped but a few days later she was back to having the same problems. Came in now because it was not improving. Denies change with eating. Denies SOB or cough. Does have GERD and that is doing well lately. Denies fevers or chills. Denies nausea or vomiting. Denies constipation but feels she always has to take something to help her go to the bathroom and she does not like that. Denies change to diet, able to do less due to pain the last month or so. No blood in stool.   Review of Systems  Constitutional: Positive for activity change and appetite change. Negative for chills, fatigue, fever and unexpected weight change.  HENT: Negative.   Eyes: Negative.   Respiratory: Negative for cough, chest tightness and shortness of breath.   Cardiovascular: Negative for chest pain, palpitations and leg swelling.  Gastrointestinal: Positive for abdominal pain and constipation. Negative for abdominal distention, anal bleeding, blood in stool, diarrhea, nausea, rectal pain and vomiting.  Musculoskeletal: Positive for myalgias.  Skin: Negative.   Neurological: Negative.   Psychiatric/Behavioral: Negative.     Objective:  Physical Exam Constitutional:      Appearance: She is well-developed.  HENT:     Head: Normocephalic and atraumatic.  Cardiovascular:     Rate and Rhythm: Normal rate and regular rhythm.  Pulmonary:     Effort: Pulmonary effort is normal. No respiratory distress.     Breath sounds: Normal breath sounds. No wheezing or rales.  Abdominal:     General: Bowel sounds are normal. There is no distension.     Palpations: Abdomen is  soft.     Tenderness: There is abdominal tenderness. There is no rebound.     Comments: Pain right upper and mild right lower abdomen without guarding or rebound  Musculoskeletal:     Cervical back: Normal range of motion.  Skin:    General: Skin is warm and dry.  Neurological:     Mental Status: She is alert and oriented to person, place, and time.     Coordination: Coordination normal.     Vitals:   02/23/20 1030  BP: 126/84  Pulse: 76  Temp: 98.2 F (36.8 C)  TempSrc: Oral  SpO2: 96%  Weight: 136 lb (61.7 kg)  Height: 5\' 4"  (1.626 m)    This visit occurred during the SARS-CoV-2 public health emergency.  Safety protocols were in place, including screening questions prior to the visit, additional usage of staff PPE, and extensive cleaning of exam room while observing appropriate contact time as indicated for disinfecting solutions.   Assessment & Plan:  Visit time 20 minutes in face to face communication with patient and coordination of care, additional 10 minutes spent in record review, coordination or care, ordering tests, communicating/referring to other healthcare professionals, documenting in medical records all on the same day of the visit for total time 30 minutes spent on the visit.

## 2020-02-23 NOTE — Patient Instructions (Signed)
We are checking the urine and the blood work today to see if we can figure out the problem.

## 2020-02-25 ENCOUNTER — Telehealth: Payer: Self-pay

## 2020-02-25 DIAGNOSIS — R112 Nausea with vomiting, unspecified: Secondary | ICD-10-CM

## 2020-02-25 DIAGNOSIS — R1011 Right upper quadrant pain: Secondary | ICD-10-CM

## 2020-02-25 NOTE — Addendum Note (Signed)
Addended by: Pricilla Holm A on: 02/25/2020 11:42 AM   Modules accepted: Orders

## 2020-02-25 NOTE — Telephone Encounter (Signed)
New message    The patient calling diagnosis with UTI at urgent care end of Jan.  Seen Dr. Sharlet Salina 02/23/20.  C/o pain has started again   Checking on test results.    Please advise on medication or antibiotic   [facility] on [address], [place]

## 2020-02-25 NOTE — Telephone Encounter (Signed)
CT scan ordered

## 2020-02-25 NOTE — Telephone Encounter (Signed)
Pt has been informed of results and expressed understanding.  Pt would like to proceed with the CT scan.

## 2020-03-11 ENCOUNTER — Ambulatory Visit
Admission: RE | Admit: 2020-03-11 | Discharge: 2020-03-11 | Disposition: A | Discharge status: Home / Self Care | Payer: Medicare Other | Source: Ambulatory Visit | Attending: Internal Medicine | Admitting: Internal Medicine

## 2020-03-11 DIAGNOSIS — R112 Nausea with vomiting, unspecified: Secondary | ICD-10-CM

## 2020-03-11 DIAGNOSIS — R1110 Vomiting, unspecified: Secondary | ICD-10-CM | POA: Diagnosis not present

## 2020-03-11 DIAGNOSIS — R1011 Right upper quadrant pain: Secondary | ICD-10-CM

## 2020-03-11 DIAGNOSIS — R109 Unspecified abdominal pain: Secondary | ICD-10-CM | POA: Diagnosis not present

## 2020-03-11 MED ORDER — IOPAMIDOL (ISOVUE-300) INJECTION 61%
100.0000 mL | Freq: Once | INTRAVENOUS | Status: AC | PRN
  Administered 2020-03-11: 09:00:00 100 mL via INTRAVENOUS

## 2020-03-18 ENCOUNTER — Telehealth: Payer: Self-pay | Admitting: Internal Medicine

## 2020-03-18 NOTE — Telephone Encounter (Signed)
    Patient calling to discuss CT scan results, reporting she still has pain on right side.  Please call

## 2020-03-18 NOTE — Telephone Encounter (Signed)
Pt contacted and informed of recommendation to continue tylenol.  Pt stated that she has been using tylenol PM at night to help with sleep.  Pt has not been taking anything for pain during the day.   Pt stated that PCP mentioned that when pt took the abx that were given by UC that the abx didn't clear up the infection.   Pt informed that she may need an appointment to reevaluate for UTI.  Pt stated understanding.

## 2020-03-18 NOTE — Telephone Encounter (Signed)
She did not have signs of UTI at our recent visit and the RUQ pain is not likely related to any UTI. The scan did show some constipation so she may benefit from adding something for constipation in case the pain is related to constipation. Okay to use tylenol regular during day for pain.

## 2020-03-19 NOTE — Telephone Encounter (Signed)
Left detailed message for patient informing that she can try a stool softener to help with the slight constipation that was seen in the CT scan.

## 2020-04-14 ENCOUNTER — Encounter: Payer: Self-pay | Admitting: Internal Medicine

## 2020-04-14 ENCOUNTER — Ambulatory Visit (INDEPENDENT_AMBULATORY_CARE_PROVIDER_SITE_OTHER): Payer: Medicare Other

## 2020-04-14 ENCOUNTER — Ambulatory Visit (INDEPENDENT_AMBULATORY_CARE_PROVIDER_SITE_OTHER): Payer: Medicare Other | Admitting: Internal Medicine

## 2020-04-14 ENCOUNTER — Other Ambulatory Visit: Payer: Self-pay

## 2020-04-14 VITALS — BP 110/62 | HR 47 | Temp 98.7°F | Ht 64.0 in | Wt 137.0 lb

## 2020-04-14 DIAGNOSIS — R1011 Right upper quadrant pain: Secondary | ICD-10-CM

## 2020-04-14 DIAGNOSIS — R0789 Other chest pain: Secondary | ICD-10-CM | POA: Diagnosis not present

## 2020-04-14 NOTE — Assessment & Plan Note (Addendum)
With prior compression fracture in lumbar spine needs thoracic x-ray today given persistent of symptoms to rule out radicular symptoms. Referral to sports medicine as it does appear to be related to [facility] system and she is wanting a specific evaluation or diagnosis. Advised that she is well within safe limits for tylenol and does not have to take tylenol pm but can take tylenol alone to avoid drowsiness which is likely caused by the benadryl component in tylenol pm. Reviewed reassuring labs and CT imaging with her in detail during visit.

## 2020-04-14 NOTE — Patient Instructions (Signed)
We will check the x-ray today and get you in with sports medicine to see if they can help [place] figure out the cause of the pain.

## 2020-04-14 NOTE — Progress Notes (Signed)
Subjective:   Patient ID: Wanda Rivera, female    DOB: [birth year], 79 y.o.   MRN: [id number]  HPI The patient is a 79 YO female coming in for persistent RUQ pain/right flank pain. Worse with exercise and this is frustrating to her. Prior evaluation in January when this started bothering her and treated for UTI which helped for a few days. She then got recurrent intermittent symptoms. Evaluated here end of February with labs and CT abdomen/pelvis. Ruled out kidney stone, gallbladder problem. Did find mild to moderate constipation. She has tried miralax with relief of symptoms. She is using tylenol for relief of symptoms but does not like taking medications especially without knowing the cause. She denies nausea or vomiting. Denies symptoms being associated with eating or specific foods. Does have GERD and taking omeprazole with good control of symptoms. Denies changes in bowels. When using elliptical at home this does trigger symptoms. She is currently taking 2 tylenol pm at night time which does cause some daytime drowsiness. Does not like to take any tylenol during the day except with severe pain. Overall not changing since last evaluation. Not currently having pain today.  Review of Systems  Constitutional: Negative.   HENT: Negative.   Eyes: Negative.   Respiratory: Negative for cough, chest tightness and shortness of breath.   Cardiovascular: Negative for chest pain, palpitations and leg swelling.  Gastrointestinal: Positive for abdominal pain. Negative for abdominal distention, constipation, diarrhea, nausea and vomiting.  Musculoskeletal: Positive for myalgias.  Skin: Negative.   Neurological: Negative.   Psychiatric/Behavioral: Negative.     Objective:  Physical Exam Constitutional:      Appearance: She is well-developed.  HENT:     Head: Normocephalic and atraumatic.  Cardiovascular:     Rate and Rhythm: Normal rate and regular rhythm.  Pulmonary:     Effort: Pulmonary effort  is normal. No respiratory distress.     Breath sounds: Normal breath sounds. No wheezing or rales.  Abdominal:     General: Bowel sounds are normal. There is no distension.     Palpations: Abdomen is soft.     Tenderness: There is no abdominal tenderness. There is no rebound.  Musculoskeletal:        General: Tenderness present.     Cervical back: Normal range of motion.     Comments: Tenderness when present is right lower ribcage area with tenderness to the right flank region  Skin:    General: Skin is warm and dry.  Neurological:     Mental Status: She is alert and oriented to person, place, and time.     Coordination: Coordination normal.     Vitals:   04/14/20 0859  BP: 110/62  Pulse: (!) 47  Temp: 98.7 F (37.1 C)  TempSrc: Oral  SpO2: 97%  Weight: 137 lb (62.1 kg)  Height: 5\' 4"  (1.626 m)    This visit occurred during the SARS-CoV-2 public health emergency.  Safety protocols were in place, including screening questions prior to the visit, additional usage of staff PPE, and extensive cleaning of exam room while observing appropriate contact time as indicated for disinfecting solutions.   Assessment & Plan:  Visit time 15 minutes in face to face communication with patient and coordination of care, additional 15 minutes spent in record review, coordination or care, ordering tests, communicating/referring to other healthcare professionals, documenting in medical records all on the same day of the visit for total time 30 minutes spent on the visit.

## 2020-04-21 ENCOUNTER — Ambulatory Visit (INDEPENDENT_AMBULATORY_CARE_PROVIDER_SITE_OTHER): Payer: Medicare Other | Admitting: Family Medicine

## 2020-04-21 ENCOUNTER — Encounter: Payer: Self-pay | Admitting: Family Medicine

## 2020-04-21 ENCOUNTER — Other Ambulatory Visit: Payer: Self-pay

## 2020-04-21 VITALS — BP 110/62 | Ht 64.0 in | Wt 136.2 lb

## 2020-04-21 DIAGNOSIS — R109 Unspecified abdominal pain: Secondary | ICD-10-CM

## 2020-04-21 DIAGNOSIS — R1011 Right upper quadrant pain: Secondary | ICD-10-CM

## 2020-04-21 MED ORDER — DULOXETINE HCL 20 MG PO CPEP: 20.0000 mg | ORAL_CAPSULE | Freq: Every day | ORAL | 1 refills | Status: DC

## 2020-04-21 NOTE — Patient Instructions (Addendum)
Thank you for coming in today. Plan for PT.  Use a rib binder. You should be able to get it at a [facility] like [facility].  Plan for Ultrasound of your belly.   Recheck in 4 weeks.  Return sooner if needed.   Start cymbalta. If it makes you sleepy take it bedtime.   Duloxetine delayed-release capsules What is this medicine? DULOXETINE (doo LOX e teen) is used to treat depression, anxiety, and different types of chronic pain. This medicine may be used for other purposes; ask your health care provider or pharmacist if you have questions. COMMON BRAND NAME(S): Cymbalta, Creig Hines, Irenka What should I tell my health care provider before I take this medicine? They need to know if you have any of these conditions:  bipolar disorder  glaucoma  high blood pressure  kidney disease  liver disease  seizures  suicidal thoughts, plans or attempt; a previous suicide attempt by you or a family member  take medicines that treat or prevent blood clots  taken medicines called MAOIs like Carbex, Eldepryl, Marplan, Nardil, and Parnate within 14 days  trouble passing urine  an unusual reaction to duloxetine, other medicines, foods, dyes, or preservatives  pregnant or trying to get pregnant  breast-feeding How should I use this medicine? Take this medicine by mouth with a glass of water. Follow the directions on the prescription label. Do not crush, cut or chew some capsules of this medicine. Some capsules may be opened and sprinkled on applesauce. Check with your doctor or pharmacist if you are not sure. You can take this medicine with or without food. Take your medicine at regular intervals. Do not take your medicine more often than directed. Do not stop taking this medicine suddenly except upon the advice of your doctor. Stopping this medicine too quickly may cause serious side effects or your condition may worsen. A special MedGuide will be given to you by the  pharmacist with each prescription and refill. Be sure to read this information carefully each time. Talk to your pediatrician regarding the use of this medicine in children. While this drug may be prescribed for children as young as 19 years of age for selected conditions, precautions do apply. Overdosage: If you think you have taken too much of this medicine contact a poison control center or emergency room at once. NOTE: This medicine is only for you. Do not share this medicine with others. What if I miss a dose? If you miss a dose, take it as soon as you can. If it is almost time for your next dose, take only that dose. Do not take double or extra doses. What may interact with this medicine? Do not take this medicine with any of the following medications:  desvenlafaxine  levomilnacipran  linezolid  MAOIs like Carbex, Eldepryl, Marplan, Nardil, and Parnate  methylene blue (injected into a vein)  milnacipran  thioridazine  venlafaxine This medicine may also interact with the following medications:  alcohol  amphetamines  aspirin and aspirin-like medicines  certain antibiotics like ciprofloxacin and enoxacin  certain medicines for blood pressure, heart disease, irregular heart beat  certain medicines for depression, anxiety, or psychotic disturbances  certain medicines for migraine headache like almotriptan, eletriptan, frovatriptan, naratriptan, rizatriptan, sumatriptan, zolmitriptan  certain medicines that treat or prevent blood clots like warfarin, enoxaparin, and dalteparin  cimetidine  fentanyl  lithium  NSAIDS, medicines for pain and inflammation, like ibuprofen or naproxen  phentermine  procarbazine  rasagiline  sibutramine  St. John's wort  theophylline  tramadol  tryptophan This list may not describe all possible interactions. Give your health care provider a list of all the medicines, herbs, non-prescription drugs, or dietary supplements you  use. Also tell them if you smoke, drink alcohol, or use illegal drugs. Some items may interact with your medicine. What should I watch for while using this medicine? Tell your doctor if your symptoms do not get better or if they get worse. Visit your doctor or healthcare provider for regular checks on your progress. Because it may take several weeks to see the full effects of this medicine, it is important to continue your treatment as prescribed by your doctor. This medicine may cause serious skin reactions. They can happen weeks to months after starting the medicine. Contact your healthcare provider right away if you notice fevers or flu-like symptoms with a rash. The rash may be red or purple and then turn into blisters or peeling of the skin. Or, you might notice a red rash with swelling of the face, lips, or lymph nodes in your neck or under your arms. Patients and their families should watch out for new or worsening thoughts of suicide or depression. Also watch out for sudden changes in feelings such as feeling anxious, agitated, panicky, irritable, hostile, aggressive, impulsive, severely restless, overly excited and hyperactive, or not being able to sleep. If this happens, especially at the beginning of treatment or after a change in dose, call your healthcare provider. You may get drowsy or dizzy. Do not drive, use machinery, or do anything that needs mental alertness until you know how this medicine affects you. Do not stand or sit up quickly, especially if you are an older patient. This reduces the risk of dizzy or fainting spells. Alcohol may interfere with the effect of this medicine. Avoid alcoholic drinks. This medicine can cause an increase in blood pressure. This medicine can also cause a sudden drop in your blood pressure, which may make you feel faint and increase the chance of a fall. These effects are most common when you first start the medicine or when the dose is increased, or during  use of other medicines that can cause a sudden drop in blood pressure. Check with your doctor for instructions on monitoring your blood pressure while taking this medicine. Your mouth may get dry. Chewing sugarless gum or sucking hard candy, and drinking plenty of water, may help. Contact your doctor if the problem does not go away or is severe. What side effects may I notice from receiving this medicine? Side effects that you should report to your doctor or health care professional as soon as possible:  allergic reactions like skin rash, itching or hives, swelling of the face, lips, or tongue  anxious  breathing problems  confusion  changes in vision  chest pain  confusion  elevated mood, decreased need for sleep, racing thoughts, impulsive behavior  eye pain  fast, irregular heartbeat  feeling faint or lightheaded, falls  feeling agitated, angry, or irritable  hallucination, loss of contact with reality  high blood pressure  loss of balance or coordination  palpitations  redness, blistering, peeling or loosening of the skin, including inside the mouth  restlessness, pacing, inability to keep still  seizures  stiff muscles  suicidal thoughts or other mood changes  trouble passing urine or change in the amount of urine  trouble sleeping  unusual bleeding or bruising  unusually weak or tired  vomiting  yellowing of  the eyes or skin Side effects that usually do not require medical attention (report to your doctor or health care professional if they continue or are bothersome):  change in sex drive or performance  change in appetite or weight  constipation  dizziness  dry mouth  headache  increased sweating  nausea  tired This list may not describe all possible side effects. Call your doctor for medical advice about side effects. You may report side effects to FDA at 1-800-FDA-1088. Where should I keep my medicine? Keep out of the reach of  children. Store at room temperature between 15 and 30 degrees C (59 to 86 degrees F). Throw away any unused medicine after the expiration date. NOTE: This sheet is a summary. It may not cover all possible information. If you have questions about this medicine, talk to your doctor, pharmacist, or health care provider.  2020 Elsevier/Gold Standard (2019-03-19 13:47:50)

## 2020-04-21 NOTE — Progress Notes (Signed)
Subjective:    CC: RUQ pain  I, Wanda Rivera, LAT, ATC, am serving as scribe for Dr. Lynne Leader.  HPI: Pt is a 79 y/o female presenting w/ c/o RUQ/flank pain since Jan 2021.  She has been worked up for gen med causes of her pain and treated for a UTI and constipation w/ limited relief.  She locates her pain to her R anterior abdomen that will radiate laterally and posteriorly.  She rates her pain as mild-mod and describes her pain as dull.  Radiating pain: yes to her lateral and posterior abdomen Aggravating factors: doing the elliptical; forward flexion; cleaning like mopping and vaccumming; Treatments tried: Tylenol; keflex for UTI; Miralax  Diagnostic testing: CT abdomen and pelvis w/ contrast-03/11/20; T-spine XR- 04/14/20  Pertinent review of Systems: No fevers or chills  Relevant historical information: History of breast cancer approximately 30 years ago.   Objective:    Vitals:   04/21/20 0851  BP: 110/62   General: Well Developed, well nourished, and in no acute distress.  Abdomen: Normal-appearing Tender palpation right upper quadrant and overlying right inferior anterior ribs. No rebound or guarding. Pain somewhat reproduced with abdominal muscular contraction and motion. MSK: L-spine nontender.  Normal lumbar motion.  Pain reproduced right abdomen with right lateral flexion and rotation  Lab and Radiology Results DG Thoracic Spine 2 View  Result Date: 04/14/2020 CLINICAL DATA:  Right-sided chest pain. EXAM: THORACIC SPINE 2 VIEWS COMPARISON:  None. FINDINGS: There is no evidence of thoracic spine fracture. Alignment is normal. No other significant bone abnormalities are identified. IMPRESSION: Negative. Electronically Signed   By: Marijo Conception M.D.   On: 04/14/2020 14:51   CT Abdomen Pelvis W Contrast  Result Date: 03/11/2020 CLINICAL DATA:  Right lower abdominal pain, recent UTI, constipation, nausea/vomiting. Prior hysterectomy. History of bilateral  breast cancer status post mastectomy. EXAM: CT ABDOMEN AND PELVIS WITH CONTRAST TECHNIQUE: Multidetector CT imaging of the abdomen and pelvis was performed using the standard protocol following bolus administration of intravenous contrast. CONTRAST:  17mL ISOVUE-300 IOPAMIDOL (ISOVUE-300) INJECTION 61% COMPARISON:  07/12/2015 FINDINGS: Lower chest: Linear scarring/atelectasis in the left lower lobe. Lung bases are otherwise clear. Status post bilateral mastectomy. Hepatobiliary: Liver is within normal limits. Gallbladder is unremarkable. No intrahepatic or extrahepatic ductal dilatation. Pancreas: Within normal limits. Spleen: Within normal limits. Adrenals/Urinary Tract: Adrenal glands are within normal limits. Bilateral renal cortical scarring. Bilateral renal cysts, measuring up to 8 mm in the right upper pole (series 11/image 19). No hydronephrosis. Bladder is within normal limits. Stomach/Bowel: Stomach is within normal limits. No evidence of bowel obstruction. Normal appendix (series 2/image 56). Mild to moderate colonic stool burden, suggesting mild constipation. Vascular/Lymphatic: No evidence of abdominal aortic aneurysm. Atherosclerotic calcifications of the abdominal aorta and branch vessels. No suspicious abdominopelvic lymphadenopathy. Reproductive: Status post hysterectomy.  No adnexal masses. Other: No abdominopelvic ascites. Musculoskeletal: Degenerative changes of the lumbar spine. Mild superior endplate compression fracture deformity at L3, unchanged from 2017. IMPRESSION: No evidence of bowel obstruction.  Normal appendix. Mild to moderate colonic stool burden, suggesting mild constipation. Additional ancillary findings as above. Electronically Signed   By: Julian Hy M.D.   On: 03/11/2020 12:09   I, Lynne Leader, personally (independently) visualized and performed the interpretation of the images attached in this note.  Recent Results (from the past 2160 hour(s))  POCT urinalysis dip  (device)     Status: Abnormal   Collection Time: 01/31/20  3:23 PM  Result Value Ref  Range   Glucose, UA NEGATIVE NEGATIVE mg/dL   Bilirubin Urine NEGATIVE NEGATIVE   Ketones, ur 80 (A) NEGATIVE mg/dL   Specific Gravity, Urine 1.020 1.005 - 1.030   Hgb urine dipstick SMALL (A) NEGATIVE   pH 7.0 5.0 - 8.0   Protein, ur NEGATIVE NEGATIVE mg/dL   Urobilinogen, UA 0.2 0.0 - 1.0 mg/dL   Nitrite POSITIVE (A) NEGATIVE   Leukocytes,Ua SMALL (A) NEGATIVE    Comment: Biochemical Testing Only. Please order routine urinalysis from main lab if confirmatory testing is needed.  Urine culture     Status: Abnormal   Collection Time: 01/31/20  3:53 PM   Specimen: Urine, Clean Catch  Result Value Ref Range   Specimen Description URINE, CLEAN CATCH    Special Requests      NONE Performed at [facility], [place] [address], [place], [place] [address]    Culture >=100,000 COLONIES/mL ESCHERICHIA COLI (A)    Report Status 02/02/2020 FINAL    Organism ID, Bacteria ESCHERICHIA COLI (A)       Susceptibility   Escherichia coli - MIC*    AMPICILLIN <=2 SENSITIVE Sensitive     CEFAZOLIN <=4 SENSITIVE Sensitive     CEFTRIAXONE <=0.25 SENSITIVE Sensitive     CIPROFLOXACIN <=0.25 SENSITIVE Sensitive     GENTAMICIN <=1 SENSITIVE Sensitive     IMIPENEM <=0.25 SENSITIVE Sensitive     NITROFURANTOIN <=16 SENSITIVE Sensitive     TRIMETH/SULFA <=20 SENSITIVE Sensitive     AMPICILLIN/SULBACTAM <=2 SENSITIVE Sensitive     PIP/TAZO <=4 SENSITIVE Sensitive     * >=100,000 COLONIES/mL ESCHERICHIA COLI  Comprehensive metabolic panel     Status: Abnormal   Collection Time: 02/23/20 11:19 AM  Result Value Ref Range   Sodium 140 135 - 145 mEq/L   Potassium 3.8 3.5 - 5.1 mEq/L   Chloride 105 96 - 112 mEq/L   CO2 31 19 - 32 mEq/L   Glucose, Bld 65 (L) 70 - 99 mg/dL   BUN 17 6 - 23 mg/dL   Creatinine, Ser 1.00 0.40 - 1.20 mg/dL   Total Bilirubin 0.4 0.2 - 1.2 mg/dL   Alkaline Phosphatase 89 39 - 117 U/L   AST 19  0 - 37 U/L   ALT 15 0 - 35 U/L   Total Protein 7.0 6.0 - 8.3 g/dL   Albumin 4.3 3.5 - 5.2 g/dL   GFR 64.98 >60.00 mL/min   Calcium 9.5 8.4 - 10.5 mg/dL  CBC     Status: None   Collection Time: 02/23/20 11:19 AM  Result Value Ref Range   WBC 6.1 4.0 - 10.5 K/uL   RBC 4.27 3.87 - 5.11 Mil/uL   Platelets 178.0 150.0 - 400.0 K/uL   Hemoglobin 13.4 12.0 - 15.0 g/dL   HCT 40.5 36.0 - 46.0 %   MCV 94.8 78.0 - 100.0 fl   MCHC 33.2 30.0 - 36.0 g/dL   RDW 13.6 11.5 - 15.5 %  Lipase     Status: None   Collection Time: 02/23/20 11:19 AM  Result Value Ref Range   Lipase 48.0 11.0 - 59.0 U/L  POCT Urinalysis Dipstick     Status: Abnormal   Collection Time: 02/23/20 11:21 AM  Result Value Ref Range   Color, UA yellow    Clarity, UA clear    Glucose, UA Negative Negative   Bilirubin, UA neg    Ketones, UA neg    Spec Grav, UA 1.015 1.010 - 1.025  Blood, UA neg    pH, UA 6.0 5.0 - 8.0   Protein, UA Positive (A) Negative   Urobilinogen, UA 0.2 0.2 or 1.0 E.U./dL   Nitrite, UA neg    Leukocytes, UA Negative Negative   Appearance     Odor        Impression and Recommendations:    Assessment and Plan: 79 y.o. female with right upper quadrant abdominal pain ongoing for approximately 3 months.  Pain started with UTI which was treated successfully with confirmatory urinalysis recheck.  Pain now is more located at the right upper quadrant where it was originally located right lower quadrant.  Pain also mostly pronounced over line right inferior anterior rib margin.  CT scan of the abdomen and pelvis did not show significant changes in this area and x-ray thoracic spine also effectively was pretty normal.    Differential at this point is quite broad.  It is likely that she has cartilaginous rib pain or abdominal wall muscular pain.  It still possible she has some intra-abdominal cause of pain although she has had a good work-up so far for this. Plan for pragmatic treatment with trial of  physical therapy and rib binder.  Additionally we will try to manage some of her pain with initiation of Cymbalta.  Will start low-dose Cymbalta and recheck in about 4 weeks.  Return sooner if needed. Lastly will broaden work-up.  Start with abdominal ultrasound right upper quadrant.  Is possible some of her pain is gallbladder related and this is a good start.  Consider HIDA scan or even GI referral for possible endoscopy in the future if needed.  Of note compression fracture at L3 noted on CT scan abdomen and pelvis likely not a factor here.  Patient is not tender in this region and does not have pain radiating down her leg in this region as well.   Orders Placed This Encounter  Procedures  . US Abdomen Limited RUQ    Standing Status:   Future    Standing Expiration Date:   06/21/2021    Order Specific Question:   Reason for Exam (SYMPTOM  OR DIAGNOSIS REQUIRED)    Answer:   eval RQU abd pain    Order Specific Question:   Preferred imaging location?    Answer:   [id number]  . Ambulatory referral to Physical Therapy    Referral Priority:   Routine    Referral Type:   Physical Medicine    Referral Reason:   Specialty Services Required    Requested Specialty:   Physical Therapy   Meds ordered this encounter  Medications  . DULoxetine (CYMBALTA) 20 MG capsule    Sig: Take 1 capsule (20 mg total) by mouth daily.    Dispense:  30 capsule    Refill:  1    Discussed warning signs or symptoms. Please see discharge instructions. Patient expresses understanding.   The above documentation has been reviewed and is accurate and complete Lynne Leader

## 2020-04-27 ENCOUNTER — Ambulatory Visit
Admission: RE | Admit: 2020-04-27 | Discharge: 2020-04-27 | Disposition: A | Discharge status: Home / Self Care | Payer: Medicare Other | Source: Ambulatory Visit | Attending: Family Medicine | Admitting: Family Medicine

## 2020-04-27 DIAGNOSIS — R1011 Right upper quadrant pain: Secondary | ICD-10-CM

## 2020-04-28 ENCOUNTER — Telehealth: Payer: Self-pay | Admitting: Family Medicine

## 2020-04-28 NOTE — Progress Notes (Signed)
Ultrasound looks pretty normal.

## 2020-04-28 NOTE — Telephone Encounter (Signed)
I spoke with Blanch Media.  She has 1st PT starting on May 5th.  She started cymbalta 20 and notes that she is improving a bit.  She has follow up with me on the 20th.   We also discussed the US abdomen results.  I do not think the small polyp needs to be followed up.

## 2020-05-04 ENCOUNTER — Ambulatory Visit: Payer: Medicare Other | Attending: Family Medicine | Admitting: Physical Therapy

## 2020-05-04 ENCOUNTER — Encounter: Payer: Self-pay | Admitting: Physical Therapy

## 2020-05-04 ENCOUNTER — Other Ambulatory Visit: Payer: Self-pay

## 2020-05-04 DIAGNOSIS — M6281 Muscle weakness (generalized): Secondary | ICD-10-CM | POA: Diagnosis not present

## 2020-05-04 DIAGNOSIS — M545 Low back pain: Secondary | ICD-10-CM | POA: Insufficient documentation

## 2020-05-04 DIAGNOSIS — G8929 Other chronic pain: Secondary | ICD-10-CM | POA: Insufficient documentation

## 2020-05-04 NOTE — Patient Instructions (Signed)
Access Code: [email or web address] URL: [email or web address] Date: 05/04/2020 Prepared by: Hilda Blades  Exercises Seated Pelvic Tilt - 2-3 x daily - 7 x weekly - 10 reps Supine Lower Trunk Rotation - 2-3 x daily - 7 x weekly - 10 reps - 3-5 seconds hold Supine Transversus Abdominis Bracing - Hands on Thighs - 2-3 x daily - 7 x weekly - 10 reps - 3-5 seconds hold Supine March - 2-3 x daily - 7 x weekly - 10 reps

## 2020-05-04 NOTE — Therapy (Signed)
[place], [place], [address] Phone: [phone]   Fax:  [phone]  Physical Therapy Evaluation  Patient Details  Name: Wanda Rivera MRN: [id number] Date of Birth: 15-Mar-1941 Referring Provider (PT): Gregor Hams, MD   Encounter Date: 05/04/2020  PT End of Session - 05/04/20 0913    Visit Number  1    Number of Visits  6    Date for PT Re-Evaluation  06/15/20    Authorization Type  MCR A/B    PT Start Time  0855    PT Stop Time  0940    PT Time Calculation (min)  45 min    Activity Tolerance  Patient tolerated treatment well    Behavior During Therapy  [facility] for tasks assessed/performed       Past Medical History:  Diagnosis Date  . 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 Breast cancer ([place])    1982, recurrence in 1985  . Cataract    Bil  . Glaucoma    Bil  . Osteoporosis     Past Surgical History:  Procedure Laterality Date  . ABDOMINAL HYSTERECTOMY    . BREAST SURGERY     double masectomy for breast cancer  . eye surgery Bilateral 06/05/2017   for Glaucoma    There were no vitals filed for this visit.   Subjective Assessment - 05/04/20 0856    Subjective  Patient reports she started having right sided abdominal pain back in January. She dealt with it for a while and was diagnosed with UTI and was treated with antibiotics which helped it for a little while. The pain began again but she did not have a UTI the 2nd time, and the pain has not improved. The pain occurred pretty constantly, she could just be sitting, bending over, housework, getting out of bed. She saw Dr. Georgina Snell and was prescribed a medication which has helped ease most of the pain. Currently, it has been quite some time since has experienced some pain. She also had an ultrasound which showed a gallbladder polyp but was so small that they are not going to do anything about it for now. She is trying to gradually work back to using her  elliptical at home.    Pertinent History  Breast cancer, history of chronic low back pain, right shoulder pain, right knee pain    Limitations  Sitting;Lifting;Standing;Walking;House hold activities    How long can you sit comfortably?  No limitation    How long can you stand comfortably?  No limitation    How long can you walk comfortably?  No limitation    Diagnostic tests  CT scan, Ultrasound    Patient Stated Goals  Get rid of pain    Currently in Pain?  Yes    Pain Score  0-No pain    Pain Location  Abdomen    Pain Orientation  Right    Pain Descriptors / Indicators  Dull    Pain Type  Chronic pain    Pain Onset  More than a month ago    Pain Frequency  Constant    Aggravating Factors   Sitting, bending forward, walking, getting out of bed, household activities such as mopping    Pain Relieving Factors  Medication    Effect of Pain on Daily Activities  Patient is hesitant to perform household activities         [facility] PT Assessment - 05/04/20 0001  Assessment   Medical Diagnosis  RUQ abdominal pain    Referring Provider (PT)  Gregor Hams, MD    Onset Date/Surgical Date  --   January 2021   Hand Dominance  Right    Next MD Visit  05/19/2020    Prior Therapy  None      Precautions   Precautions  None      Restrictions   Weight Bearing Restrictions  No      Balance Screen   Has the patient fallen in the past 6 months  No    Has the patient had a decrease in activity level because of a fear of falling?   No    Is the patient reluctant to leave their home because of a fear of falling?   No      Home Environment   Living Environment  Private residence    Type of [place] to enter    Home Layout  Two level      Prior Function   Level of [place]  Retired    Leisure  Walking, household activities      Cognition   Overall Cognitive Status  Within Functional Limits for tasks assessed       Observation/Other Assessments   Observations  Patient appears in no apparent distress    Focus on Therapeutic Outcomes (FOTO)   42%      Sensation   Light Touch  Appears Intact      Posture/Postural Control   Posture Comments  Patient exhibits rounded shoulder and forward head posture      ROM / Strength   AROM / PROM / Strength  AROM;Strength      AROM   Overall AROM Comments  Patient exhibits lumbar AROM  grossly WFL without pain       Strength   Overall Strength Comments  Core strength grossly 4-/5    Strength Assessment Site  Hip    Right/Left Hip  Right;Left    Right Hip Flexion  4-/5    Right Hip Extension  4-/5    Right Hip ABduction  3+/5    Left Hip Flexion  4-/5    Left Hip Extension  4-/5    Left Hip ABduction  3+/5      Palpation   Palpation comment  Non-TTP      Special Tests   Other special tests  None performed      Transfers   Transfers  Independent with all Transfers                Objective measurements completed on examination: See above findings.      [facility] Adult PT Treatment/Exercise - 05/04/20 0001      Exercises   Exercises  Lumbar      Lumbar Exercises: Stretches   Lower Trunk Rotation Limitations  5 sec hold x10 each      Lumbar Exercises: Seated   Other Seated Lumbar Exercises  Pelvic tilt x10      Lumbar Exercises: Supine   Ab Set  10 reps;3 seconds    AB Set Limitations  TA contraction with pushing hands into thighs    Bent Knee Raise  10 reps    Bent Knee Raise Limitations  marching with TA contraction             PT Education - 05/04/20 0911    Education  Details  Exam findings, POC, HEP    Person(s) Educated  Patient    Methods  Explanation;Demonstration;Tactile cues;Verbal cues;Handout    Comprehension  Verbalized understanding;Returned demonstration;Verbal cues required;Tactile cues required;Need further instruction       PT Short Term Goals - 05/04/20 0914      PT SHORT TERM GOAL #1   Title   Patient will be I with HEP and progressions to maintain progress in PT    Time  6    Period  Weeks    Status  New    Target Date  06/15/20      PT SHORT TERM GOAL #2   Title  Patient will report improved functional level to </= 34% on FOTO    Time  6    Period  Weeks    Status  New    Target Date  06/15/20      PT SHORT TERM GOAL #3   Title  Patient will be able to perform household tasks such as mopping with no limitation due to right abdominal pain    Time  6    Period  Weeks    Status  New    Target Date  06/15/20      PT SHORT TERM GOAL #4   Title  Patient will report </= 0-1/10 pain level at least 75% of the time with activity to improve functional ability    Time  6    Period  Weeks    Status  New    Target Date  06/15/20        PT Long Term Goals - 05/04/20 0917      PT LONG TERM GOAL #1   Title  STG=LTG             Plan - 05/04/20 0953    Clinical Impression Statement  Patient presents to PT with chronic right sided upper abdominal pain that limits her ability to perform household activities and exercises such as walking and elliptical. She has been improving since starting medication and denies pain this visit. She does exhibit gross core and hip strength but demonstrates good motion without eliciting pain. She was provided with exercises to initiate core strengthening and she would benefit from continued skilled PT to progress strength and tolerance for activity so she can return to previous level of function including walking and elliptical without limitation or pain.    Personal Factors and Comorbidities  Age;Time since onset of injury/illness/exacerbation    Examination-Activity Limitations  Bed Mobility;Locomotion Level;Sit;Stand;Lift;Carry;Bend    Examination-Participation Restrictions  Cleaning;Community Activity;Shop    Stability/Clinical Decision Making  Stable/Uncomplicated    Clinical Decision Making  Low    Rehab Potential  Good    PT Frequency   1x / week    PT Duration  6 weeks    PT Treatment/Interventions  ADLs/Self Care Home Management;Cryotherapy;Electrical Stimulation;Moist Heat;Neuromuscular re-education;Therapeutic exercise;Therapeutic activities;Functional mobility training;Patient/family education;Manual techniques;Stair training;Gait training;Balance training;Passive range of motion;Dry needling;Joint Manipulations;Spinal Manipulations;Taping    PT Next Visit Plan  Assess HEP and progress PRN, progress grentle core strengthening    PT Home Exercise Plan  [id number]: seated pelvic tilt, LTR, supine TA contactions, supine marching    Consulted and Agree with Plan of Care  Patient       Patient will benefit from skilled therapeutic intervention in order to improve the following deficits and impairments:  Decreased strength, Postural dysfunction, Pain, Decreased activity tolerance  Visit Diagnosis: Muscle weakness (generalized)  Chronic right-sided low  back pain without sciatica     Problem List Patient Active Problem List   Diagnosis Date Noted  . RUQ pain 02/23/2020  . Dysphagia 08/19/2019  . Primary osteoarthritis of right knee 12/26/2018  . Cough 12/19/2018  . Constipation 12/19/2018  . GERD (gastroesophageal reflux disease) 12/13/2017  . History of breast cancer 01/02/2017  . Compression fracture of L3 lumbar vertebra 05/04/2016  . Hyperlipidemia 08/16/2015  . Left carotid bruit 08/16/2015  . Atherosclerotic peripheral vascular disease ([place]) 08/06/2015  . Routine general medical examination at a health care facility 12/09/2014  . Osteopenia 12/09/2014  . Glaucoma 12/09/2014    Hilda Blades, PT, DPT, LAT, ATC 05/04/20  10:03 AM Phone: [phone] Fax: [facility] [address] [place], [place], [address] Phone: [phone]   Fax:  [phone]  Name: Wanda Rivera MRN: [id number] Date of Birth: Aug 24, 1941

## 2020-05-09 ENCOUNTER — Other Ambulatory Visit: Payer: Self-pay

## 2020-05-09 ENCOUNTER — Ambulatory Visit: Payer: Medicare Other | Admitting: Physical Therapy

## 2020-05-09 ENCOUNTER — Encounter: Payer: Self-pay | Admitting: Physical Therapy

## 2020-05-09 DIAGNOSIS — G8929 Other chronic pain: Secondary | ICD-10-CM

## 2020-05-09 DIAGNOSIS — M6281 Muscle weakness (generalized): Secondary | ICD-10-CM

## 2020-05-09 DIAGNOSIS — M545 Low back pain: Secondary | ICD-10-CM

## 2020-05-09 NOTE — Therapy (Signed)
[place], [place], [address] Phone: [phone]   Fax:  [phone]  Physical Therapy Treatment  Patient Details  Name: Wanda Rivera MRN: [id number] Date of Birth: [birth year] Referring Provider (PT): Gregor Hams, MD   Encounter Date: 05/09/2020  PT End of Session - 05/09/20 1102    Visit Number  2    Number of Visits  6    Date for PT Re-Evaluation  06/15/20    Authorization Type  MCR A/B    PT Start Time  1102    PT Stop Time  1154    PT Time Calculation (min)  52 min    Activity Tolerance  Patient tolerated treatment well    Behavior During Therapy  [facility] for tasks assessed/performed       Past Medical History:  Diagnosis Date  . 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 Breast cancer ([place])    1982, recurrence in 1985  . Cataract    Bil  . Glaucoma    Bil  . Osteoporosis     Past Surgical History:  Procedure Laterality Date  . ABDOMINAL HYSTERECTOMY    . BREAST SURGERY     double masectomy for breast cancer  . eye surgery Bilateral 06/05/2017   for Glaucoma    There were no vitals filed for this visit.  Subjective Assessment - 05/09/20 1102    Subjective  Pt reports her pain is less with her medication, doing her HEP not sure if they are making difference    Patient Stated Goals  Get rid of pain    Currently in Pain?  Yes    Pain Score  1     Pain Location  Abdomen    Pain Orientation  Right    Pain Descriptors / Indicators  Dull    Pain Type  Chronic pain    Pain Onset  More than a month ago    Pain Frequency  Constant                       [facility] Adult PT Treatment/Exercise - 05/09/20 0001      Exercises   Exercises  Lumbar      Lumbar Exercises: Stretches   Other Lumbar Stretch Exercise  lat and shoulder stretching in hooklying moving from goal posts to scarecrow, angels and flowing between all       Lumbar Exercises: Aerobic   UBE (Upper Arm Bike)  L1 x 4'  alt FWD/BWD      Lumbar Exercises: Supine   Other Supine Lumbar Exercises  10 x 5 sec holds shoulder presses, thoracic lifts,     Other Supine Lumbar Exercises  lat pull downs in hooklying holding 3# wt,       Lumbar Exercises: Sidelying   Other Sidelying Lumbar Exercises  10 reps open book with red band, vc for form  Rt shoulder abduction to 90 with yellow band       Modalities   Modalities  Moist Heat      Moist Heat Therapy   Number Minutes Moist Heat  10 Minutes    Moist Heat Location  --   Rt side - abdomen and ribs              PT Short Term Goals - 05/04/20 0914      PT SHORT TERM GOAL #1   Title  Patient will be I with HEP and progressions  to maintain progress in PT    Time  6    Period  Weeks    Status  New    Target Date  06/15/20      PT SHORT TERM GOAL #2   Title  Patient will report improved functional level to </= 34% on FOTO    Time  6    Period  Weeks    Status  New    Target Date  06/15/20      PT SHORT TERM GOAL #3   Title  Patient will be able to perform household tasks such as mopping with no limitation due to right abdominal pain    Time  6    Period  Weeks    Status  New    Target Date  06/15/20      PT SHORT TERM GOAL #4   Title  Patient will report </= 0-1/10 pain level at least 75% of the time with activity to improve functional ability    Time  6    Period  Weeks    Status  New    Target Date  06/15/20        PT Long Term Goals - 05/04/20 0917      PT LONG TERM GOAL #1   Title  STG=LTG            Plan - 05/09/20 1145    Clinical Impression Statement  This is Joyces second visit.  She reports doing her HEP.  Pt tolerated tx well with reports of some fatigue and slight increase in side soreness after exercise.  Heat settled it down.  She would benefit from continued tx to engage core to include lats.    Rehab Potential  Good    PT Frequency  1x / week    PT Duration  6 weeks    PT Treatment/Interventions  ADLs/Self  Care Home Management;Cryotherapy;Electrical Stimulation;Moist Heat;Neuromuscular re-education;Therapeutic exercise;Therapeutic activities;Functional mobility training;Patient/family education;Manual techniques;Stair training;Gait training;Balance training;Passive range of motion;Dry needling;Joint Manipulations;Spinal Manipulations;Taping    PT Next Visit Plan  progress PRN, progress grentle core strengthening    Consulted and Agree with Plan of Care  Patient       Patient will benefit from skilled therapeutic intervention in order to improve the following deficits and impairments:  Decreased strength, Postural dysfunction, Pain, Decreased activity tolerance  Visit Diagnosis: Muscle weakness (generalized)  Chronic right-sided low back pain without sciatica     Problem List Patient Active Problem List   Diagnosis Date Noted  . RUQ pain 02/23/2020  . Dysphagia 08/19/2019  . Primary osteoarthritis of right knee 12/26/2018  . Cough 12/19/2018  . Constipation 12/19/2018  . GERD (gastroesophageal reflux disease) 12/13/2017  . History of breast cancer 01/02/2017  . Compression fracture of L3 lumbar vertebra 05/04/2016  . Hyperlipidemia 08/16/2015  . Left carotid bruit 08/16/2015  . Atherosclerotic peripheral vascular disease ([place]) 08/06/2015  . Routine general medical examination at a health care facility 12/09/2014  . Osteopenia 12/09/2014  . Glaucoma 12/09/2014    Jeral Pinch PT  05/09/2020, 11:47 AM  [facility] [address] [place], [place], [address] Phone: [phone]   Fax:  [phone]  Name: Destynee Angerer MRN: [id number] Date of Birth: 16-Dec-1941

## 2020-05-11 DIAGNOSIS — H401111 Primary open-angle glaucoma, right eye, mild stage: Secondary | ICD-10-CM | POA: Diagnosis not present

## 2020-05-11 DIAGNOSIS — H401123 Primary open-angle glaucoma, left eye, severe stage: Secondary | ICD-10-CM | POA: Diagnosis not present

## 2020-05-18 ENCOUNTER — Encounter: Payer: Self-pay | Admitting: Physical Therapy

## 2020-05-18 ENCOUNTER — Other Ambulatory Visit: Payer: Self-pay

## 2020-05-18 ENCOUNTER — Ambulatory Visit: Payer: Medicare Other | Admitting: Physical Therapy

## 2020-05-18 DIAGNOSIS — M6281 Muscle weakness (generalized): Secondary | ICD-10-CM

## 2020-05-18 DIAGNOSIS — M545 Low back pain, unspecified: Secondary | ICD-10-CM

## 2020-05-18 DIAGNOSIS — G8929 Other chronic pain: Secondary | ICD-10-CM | POA: Diagnosis not present

## 2020-05-18 NOTE — Therapy (Addendum)
[place], [place], [address] Phone: [phone]   Fax:  [phone]  Physical Therapy Treatment / Discharge  Patient Details  Name: Wanda Rivera MRN: [id number] Date of Birth: 08/18/1941 Referring Provider (PT): Gregor Hams, MD   Encounter Date: 05/18/2020  PT End of Session - 05/18/20 0927    Visit Number  3    Number of Visits  6    Date for PT Re-Evaluation  06/15/20    Authorization Type  MCR A/B    PT Start Time  0928    PT Stop Time  1013    PT Time Calculation (min)  45 min    Activity Tolerance  Patient tolerated treatment well    Behavior During Therapy  [facility] for tasks assessed/performed       Past Medical History:  Diagnosis Date  . 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 Breast cancer ([place])    1982, recurrence in 1985  . Cataract    Bil  . Glaucoma    Bil  . Osteoporosis     Past Surgical History:  Procedure Laterality Date  . ABDOMINAL HYSTERECTOMY    . BREAST SURGERY     double masectomy for breast cancer  . eye surgery Bilateral 06/05/2017   for Glaucoma    There were no vitals filed for this visit.  Subjective Assessment - 05/18/20 0928    Subjective  Pt states she was sore for about 3 days after the last visit, No pain today, none since the weekend. Currently the glaucoma in her Lt eye has been her focus.    Patient Stated Goals  Get rid of pain    Currently in Pain?  No/denies         [facility] PT Assessment - 05/18/20 0001      Assessment   Medical Diagnosis  RUQ abdominal pain    Referring Provider (PT)  Gregor Hams, MD      Observation/Other Assessments   Focus on Therapeutic Outcomes (FOTO)   34% limited                     [facility] Adult PT Treatment/Exercise - 05/18/20 0001      Lumbar Exercises: Stretches   Passive Hamstring Stretch  20 seconds    Single Knee to Chest Stretch  20 seconds    Hip Flexor Stretch  20 seconds    ITB Stretch  20  seconds    Piriformis Stretch  20 seconds      Lumbar Exercises: Aerobic   UBE (Upper Arm Bike)  L2 x 4' alt FWD/BWD      Lumbar Exercises: Supine   Basic Lumbar Stabilization  10 reps   2 sets with leg extension   Other Supine Lumbar Exercises  2x10 pilates 90/90 with toe taps             PT Education - 05/18/20 0950    Education Details  HEP progression    Person(s) Educated  Patient    Methods  Explanation;Demonstration;Handout    Comprehension  Returned demonstration;Verbalized understanding       PT Short Term Goals - 05/18/20 0940      PT SHORT TERM GOAL #1   Title  Patient will be I with HEP and progressions to maintain progress in PT    Baseline  instructed in HEP progression today    Status  On-going      PT  SHORT TERM GOAL #2   Title  Patient will report improved functional level to </= 34% on FOTO    Baseline  34% limited    Status  Achieved      PT SHORT TERM GOAL #3   Title  Patient will be able to perform household tasks such as mopping with no limitation due to right abdominal pain    Status  Achieved      PT SHORT TERM GOAL #4   Title  Patient will report </= 0-1/10 pain level at least 75% of the time with activity to improve functional ability    Status  Achieved        PT Long Term Goals - 05/04/20 0917      PT LONG TERM GOAL #1   Title  STG=LTG            Plan - 05/18/20 0948    Clinical Impression Statement  Wanda Rivera is doing very well, met almost all her goals.  She has not returned to all her household activities yet due to fear of pain returning.  She is going to do this between now and her next visit and let us know how it goes.  She was able to perform new HEP with some rests and verbal cues for form.  She will practice this as well.  If she continues to do well she may be ready for discharge to HEP    Rehab Potential  Good    PT Frequency  1x / week    PT Duration  6 weeks    PT Treatment/Interventions  ADLs/Self Care Home  Management;Cryotherapy;Electrical Stimulation;Moist Heat;Neuromuscular re-education;Therapeutic exercise;Therapeutic activities;Functional mobility training;Patient/family education;Manual techniques;Stair training;Gait training;Balance training;Passive range of motion;Dry needling;Joint Manipulations;Spinal Manipulations;Taping    PT Next Visit Plan  assess for possible d/c to HEP if still doing well. Review new HEP and add more if needed.       Patient will benefit from skilled therapeutic intervention in order to improve the following deficits and impairments:  Decreased strength, Postural dysfunction, Pain, Decreased activity tolerance  Visit Diagnosis: Muscle weakness (generalized)  Chronic right-sided low back pain without sciatica     Problem List Patient Active Problem List   Diagnosis Date Noted  . RUQ pain 02/23/2020  . Dysphagia 08/19/2019  . Primary osteoarthritis of right knee 12/26/2018  . Cough 12/19/2018  . Constipation 12/19/2018  . GERD (gastroesophageal reflux disease) 12/13/2017  . History of breast cancer 01/02/2017  . Compression fracture of L3 lumbar vertebra 05/04/2016  . Hyperlipidemia 08/16/2015  . Left carotid bruit 08/16/2015  . Atherosclerotic peripheral vascular disease ([place]) 08/06/2015  . Routine general medical examination at a health care facility 12/09/2014  . Osteopenia 12/09/2014  . Glaucoma 12/09/2014    Boneta Lucks rPT  05/18/2020, 10:14 AM  [facility] [address] [place], [place], [address] Phone: [phone]   Fax:  [phone]  Name: Wanda Rivera MRN: [id number] Date of Birth: 1941-04-18    PHYSICAL THERAPY DISCHARGE SUMMARY  Visits from Start of Care: 3  Current functional level related to goals / functional outcomes: See above   Remaining deficits: See above   Education / Equipment: HEP Plan: Patient agrees to discharge.  Patient goals were not met. Patient is  being discharged due to being pleased with the current functional level.  ?????    Hilda Blades, PT, DPT, LAT, ATC 06/07/20  3:07 PM Phone: [phone] Fax: [phone]

## 2020-05-19 ENCOUNTER — Encounter: Payer: Self-pay | Admitting: Family Medicine

## 2020-05-19 ENCOUNTER — Ambulatory Visit (INDEPENDENT_AMBULATORY_CARE_PROVIDER_SITE_OTHER): Payer: Medicare Other | Admitting: Family Medicine

## 2020-05-19 VITALS — BP 96/60 | HR 50 | Ht 64.0 in | Wt 133.8 lb

## 2020-05-19 DIAGNOSIS — R109 Unspecified abdominal pain: Secondary | ICD-10-CM | POA: Diagnosis not present

## 2020-05-19 NOTE — Patient Instructions (Signed)
Thank you for coming in today. Let me know if you would like me to refill the Cymbalta.  We can continue the current dose into the future if needed.  Continue home exercises.  Let me know if you would like more PT into the future.

## 2020-05-19 NOTE — Progress Notes (Signed)
I, Wendy Poet, LAT, ATC, am serving as scribe for Dr. Lynne Leader.  Wanda Rivera is a 79 y.o. female who presents to [facility] today for f/u of her R upper quadrant pain.  She was last seen by Dr. Georgina Snell on 04/21/20 and was referred to outpatient PT of which she has completed 3 visits.  She was prescribed Cymbalta and advised to purchase a rib binder.  Since her last visit, pt reports that her pain has improved.  She has been doing her HEP as prescribed at PT.  She states that she has been using the Cymbalta 20 mg daily and finds it to be very beneficial.  She would like to see if she can wean off of it in the future.  Diagnostic testing: CT abdomen/pelvis- 03/11/20; T-spine XR- 04/14/20; Abd [place]- 04/27/20   Pertinent review of systems: No fevers or chills  Relevant historical information: Arthrosclerotic vascular disease.  History of breast cancer.   Exam:  BP 96/60 (BP Location: Right Arm, Patient Position: Sitting, Cuff Size: Normal)   Pulse (!) 50   Ht 5\' 4"  (1.626 m)   Wt 133 lb 12.8 oz (60.7 kg)   SpO2 94%   BMI 22.97 kg/m  General: Well Developed, well nourished, and in no acute distress.   MSK: Normal symmetrical rib expansion.  Normal gait.    Lab and Radiology Results DG Thoracic Spine 2 View  Result Date: 04/14/2020 CLINICAL DATA:  Right-sided chest pain. EXAM: THORACIC SPINE 2 VIEWS COMPARISON:  None. FINDINGS: There is no evidence of thoracic spine fracture. Alignment is normal. No other significant bone abnormalities are identified. IMPRESSION: Negative. Electronically Signed   By: Marijo Conception M.D.   On: 04/14/2020 14:51   CT Abdomen Pelvis W Contrast  Result Date: 03/11/2020 CLINICAL DATA:  Right lower abdominal pain, recent UTI, constipation, nausea/vomiting. Prior hysterectomy. History of bilateral breast cancer status post mastectomy. EXAM: CT ABDOMEN AND PELVIS WITH CONTRAST TECHNIQUE: Multidetector CT imaging of the abdomen  and pelvis was performed using the standard protocol following bolus administration of intravenous contrast. CONTRAST:  145mL ISOVUE-300 IOPAMIDOL (ISOVUE-300) INJECTION 61% COMPARISON:  07/12/2015 FINDINGS: Lower chest: Linear scarring/atelectasis in the left lower lobe. Lung bases are otherwise clear. Status post bilateral mastectomy. Hepatobiliary: Liver is within normal limits. Gallbladder is unremarkable. No intrahepatic or extrahepatic ductal dilatation. Pancreas: Within normal limits. Spleen: Within normal limits. Adrenals/Urinary Tract: Adrenal glands are within normal limits. Bilateral renal cortical scarring. Bilateral renal cysts, measuring up to 8 mm in the right upper pole (series 11/image 19). No hydronephrosis. Bladder is within normal limits. Stomach/Bowel: Stomach is within normal limits. No evidence of bowel obstruction. Normal appendix (series 2/image 56). Mild to moderate colonic stool burden, suggesting mild constipation. Vascular/Lymphatic: No evidence of abdominal aortic aneurysm. Atherosclerotic calcifications of the abdominal aorta and branch vessels. No suspicious abdominopelvic lymphadenopathy. Reproductive: Status post hysterectomy.  No adnexal masses. Other: No abdominopelvic ascites. Musculoskeletal: Degenerative changes of the lumbar spine. Mild superior endplate compression fracture deformity at L3, unchanged from 2017. IMPRESSION: No evidence of bowel obstruction.  Normal appendix. Mild to moderate colonic stool burden, suggesting mild constipation. Additional ancillary findings as above. Electronically Signed   By: Julian Hy M.D.   On: 03/11/2020 12:09   US Abdomen Limited RUQ  Result Date: 04/27/2020 CLINICAL DATA:  Right upper quadrant pain. EXAM: ULTRASOUND ABDOMEN LIMITED RIGHT UPPER QUADRANT COMPARISON:  CT abdomen pelvis dated March 11, 2020. FINDINGS: Gallbladder: No gallstones or  wall thickening visualized. 2 mm polyp versus adenomyomatosis in the proximal  gallbladder wall adjacent to the liver. No sonographic Murphy sign noted by sonographer. Common bile duct: Diameter: 4 mm, normal. Liver: No focal lesion identified. Within normal limits in parenchymal echogenicity. Portal vein is patent on color Doppler imaging with normal direction of blood flow towards the liver. Other: None. IMPRESSION: 1. No acute abnormality. 2. 2 mm gallbladder polyp versus adenomyomatosis in the proximal gallbladder wall. No further follow-up required. This recommendation follows ACR consensus guidelines: White Paper of the ACR Incidental Findings Committee II on Gallbladder and Biliary Findings. J Am Coll Radiol 2013:;10:953-956. Electronically Signed   By: Titus Dubin M.D.   On: 04/27/2020 14:45   I, Lynne Leader, personally (independently) visualized and performed the interpretation of the xray images attached in this note.     Assessment and Plan: 79 y.o. female with thoracic pain.  Significantly improved with Cymbalta and physical therapy.  She still has some residual pain but overall is much better.  Discussed with patient options.  Plan to proceed with home exercise program and watchful waiting.  She would like to continue taking the Cymbalta for at least 1 more month.  She is already on the lowest dose so not much opportunity to wean off of medication after it runs out.  Advised that it is reasonable to continue Cymbalta indefinitely if it helps.  She will let me know if she would like a refill of this medication.  If refilling will prescribe 90-day supply with 3 refills to her mail order pharmacy.  Additionally advised that I could reauthorize physical therapy in the future if the pain returns and is not significantly changed.   Discussed warning signs or symptoms. Please see discharge instructions. Patient expresses understanding.   The above documentation has been reviewed and is accurate and complete Lynne Leader, M.D.   Total encounter time 20 minutes  including charting time date of service. Discussion next steps and cymbalta.

## 2020-05-23 ENCOUNTER — Ambulatory Visit: Payer: Medicare Other | Admitting: Physical Therapy

## 2020-05-31 ENCOUNTER — Ambulatory Visit: Payer: Medicare Other | Admitting: Physical Therapy

## 2020-06-08 DIAGNOSIS — H401123 Primary open-angle glaucoma, left eye, severe stage: Secondary | ICD-10-CM | POA: Diagnosis not present

## 2020-06-08 DIAGNOSIS — H401111 Primary open-angle glaucoma, right eye, mild stage: Secondary | ICD-10-CM | POA: Diagnosis not present

## 2020-06-29 ENCOUNTER — Telehealth: Payer: Self-pay | Admitting: Internal Medicine

## 2020-06-29 DIAGNOSIS — I70209 Unspecified atherosclerosis of native arteries of extremities, unspecified extremity: Secondary | ICD-10-CM

## 2020-06-29 NOTE — Progress Notes (Signed)
  Chronic Care Management   Note  06/29/2020 Name: Kareem Aul MRN: [id number] DOB: 11-18-1941  Elim Peale is a 79 y.o. year old female who is a primary care patient of Hoyt Koch, MD. I reached out to [name] by phone today in response to a referral sent by Ms. Blanch Media Babineaux's PCP, Hoyt Koch, MD.   Ms. Blankenhorn was given information about Chronic Care Management services today including:  1. CCM service includes personalized support from designated clinical staff supervised by her physician, including individualized plan of care and coordination with other care providers 2. 24/7 contact phone numbers for assistance for urgent and routine care needs. 3. Service will only be billed when office clinical staff spend 20 minutes or more in a month to coordinate care. 4. Only one practitioner may furnish and bill the service in a calendar month. 5. The patient may stop CCM services at any time (effective at the end of the month) by phone call to the office staff.   Patient agreed to services and verbal consent obtained.  This note is not being shared with the patient for the following reason: To respect privacy (The patient or proxy has requested that the information not be shared).  Follow up plan:   Earney Hamburg Upstream Scheduler

## 2020-08-26 ENCOUNTER — Other Ambulatory Visit: Payer: Self-pay

## 2020-08-26 ENCOUNTER — Ambulatory Visit: Payer: Medicare Other | Admitting: Pharmacist

## 2020-08-26 DIAGNOSIS — E782 Mixed hyperlipidemia: Secondary | ICD-10-CM

## 2020-08-26 DIAGNOSIS — M8580 Other specified disorders of bone density and structure, unspecified site: Secondary | ICD-10-CM

## 2020-08-26 DIAGNOSIS — M1711 Unilateral primary osteoarthritis, right knee: Secondary | ICD-10-CM

## 2020-08-26 NOTE — Chronic Care Management (AMB) (Signed)
Chronic Care Management Pharmacy  Name: Wanda Rivera  MRN: [id number] DOB: 12/21/1941   Chief Complaint/ HPI  Wanda Rivera,  79 y.o. , female presents for their Initial CCM visit with the clinical pharmacist In office.  PCP : Hoyt Koch, MD Patient Care Team: Hoyt Koch, MD as PCP - General (Internal Medicine) Adrian Prows, MD as Consulting Physician (Cardiology) Charlton Haws, [facility] as Pharmacist (Pharmacist)  Their chronic conditions include: Hyperlipidemia, GERD, Osteoporosis and Osteoarthritis, PVD, hx breast cancer (1985), Glaucoma, lumbar compression fracture  Patient lives alone in a 2 story townhouse. She has a son living in [place]. She lived in [place] many years before moving to [facility]. 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 She describes herself as "clumsy" and has had a fall in the past in which she hurt her knee. She does have a cane that she sometimes uses around her home for balance.   Office Visits: 04/14/20 Dr Sharlet Salina OV: c/o RUQ pain. Referred to sports med  02/23/20 Dr Sharlet Salina OV: c/o RUQ pain. UA and labs wnl, CT normal.  Consult Visit: 06/08/20 Dr Manuella Ghazi (ophthalmology): f/u for glaucoma 05/19/20 Dr Georgina Snell (sports med): f/u thoracic pain, improved with Cymbalta and PT. Pt decision to wean off Cymbalta or continue indefinitely 04/21/20 Dr Georgina Snell (sports med): eval for thoracic pain, started cymbalta and referred to PT. 01/31/20 Urgent care: tx for UTI with Keflex.  Allergies  Allergen Reactions  . Darvon [Propoxyphene]     Darvocet-Makes her "crazy"  . Morphine And Related     Makes her "crazy"  . Percocet [Oxycodone-Acetaminophen]     Makes her "crazy"  . Vicodin [Hydrocodone-Acetaminophen]     Makes her "crazy"    Medications: Outpatient Encounter Medications as of 08/26/2020  Medication Sig  . acetaminophen (TYLENOL) 500 MG tablet Take 1,000 mg by mouth every 6 (six) hours as needed for moderate pain or  headache.   Marland Kitchen BIOTIN PO Take 1,000 mcg by mouth daily.   . Calcium Carb-Cholecalciferol (CALCIUM + D3) 600-200 MG-UNIT TABS Take 1 tablet by mouth 2 (two) times daily.   . Cholecalciferol (VITAMIN D3) 400 UNITS CAPS Take 400 Units by mouth daily.   . Cyanocobalamin (VITAMIN B 12 PO) Take 500 mg by mouth daily.  Marland Kitchen DIGESTIVE ENZYMES PO Take 1 capsule by mouth daily.   . dorzolamide-timolol (COSOPT) 22.3-6.8 MG/ML ophthalmic solution Place 1 drop into both eyes 2 (two) times daily.   . DULoxetine (CYMBALTA) 20 MG capsule Take 1 capsule (20 mg total) by mouth daily.  Marland Kitchen latanoprost (XALATAN) 0.005 % ophthalmic solution Place 1 drop into both eyes at bedtime.   . meclizine (ANTIVERT) 12.5 MG tablet Take 12.5 mg by mouth 3 (three) times daily as needed for dizziness.   . Multiple Vitamin (ONE-A-DAY ESSENTIAL PO) Take by mouth daily.  . Omega-3 1000 MG CAPS Take by mouth.  Marland Kitchen omeprazole (PRILOSEC) 40 MG capsule Take 1 capsule (40 mg total) by mouth 2 (two) times daily.  . polyethylene glycol powder (GLYCOLAX/MIRALAX) powder Take 17 g by mouth 2 (two) times daily as needed.  . rosuvastatin (CRESTOR) 10 MG tablet Take 1 tablet (10 mg total) by mouth daily.  . Turmeric 500 MG TABS Take by mouth daily.   No facility-administered encounter medications on file as of 08/26/2020.     Current Diagnosis/Assessment:  SDOH Interventions     Most Recent Value  SDOH Interventions  Financial Strain Interventions Intervention Not Indicated  Goals Addressed            This Visit's Progress   . Pharmacy Care Plan       CARE PLAN ENTRY (see longitudinal plan of care for additional care plan information)  Current Barriers:  . Chronic Disease Management support, education, and care coordination needs related to Hyperlipidemia, Osteoporosis, and Osteoarthritis, Insomnia  Hyperlipidemia Lab Results  Component Value Date/Time   LDLCALC 141 (H) 12/21/2019 09:13 AM   LDLCALC 132 (H) 08/12/2015 08:00  AM .  Pharmacist Clinical Goal(s): o Over the next 150 days, patient will work with PharmD and providers to achieve LDL goal < 70 . Current regimen:  o Rosuvastatin 10 mg daily . Interventions: o Discussed cholesterol goals and benefits of medications for prevention of heart attack / stroke . Patient self care activities - Over the next 150 days, patient will: o Continue medication as prescribed  Osteoarthritis . Pharmacist Clinical Goal(s) o Over the next 150 days, patient will work with PharmD and providers to optimize therapy . Current regimen:  o Tylenol PM as needed . Interventions: o Discussed "PM" portion is Benadryl which causes sleepiness. Plain Tylenol will not cause sleepiness. o Recommend Tylenol 500 mg as needed for arthritis pain o Discussed benefits of Voltaren gel for arthritic joints . Patient self care activities - Over the next 150 days, patient will: o Use regular Tylenol Extra Strength (not PM) 1-2 tablets up to 3 times daily as needed o Recommend Voltaren gel up to 4 times daily as needed  Osteoporosis . Pharmacist Clinical Goal(s) o Over the next 150 days, patient will work with PharmD and providers to optimize therapy . Current regimen:  o Calcium-Vitamin D o Vitamin D . Interventions: o Recommend 1000-2000 IU of Vitamin D per day o Recommend 1200 mg of calcium from dietary and supplemental sources o Discussed repeat DEXA is due December 2021 to assess bone healthy o Discussed Prolia injection every 6 months may be beneficial if osteoporosis is still present on repeat DEXA . Patient self care activities - Over the next 150 days, patient will: o Follow up with PCP as scheduled  Insomnia . Pharmacist Clinical Goal(s) o Over the next 150 days, patient will work with PharmD and providers to optimize therapy . Current regimen:  o No medications . Interventions: o Discussed sleep hygiene - avoiding screens within 30 minutes of bedtime o Discussed benefits  of melatonin for sleep . Patient self care activities - Over the next 150 days, patient will: o Try melatonin 1 mg HS. May increase up to 2 tablets if no benefit. Maximum dose 10 mg  Medication management . Pharmacist Clinical Goal(s): o Over the next 150 days, patient will work with PharmD and providers to maintain optimal medication adherence . Current pharmacy: [facility] . Interventions o Comprehensive medication review performed. o Continue current medication management strategy . Patient self care activities - Over the next 150 days, patient will: o Focus on medication adherence by fill date o Take medications as prescribed o Report any questions or concerns to PharmD and/or provider(s)  Initial goal documentation       Hyperlipidemia   LDL goal < 70 Atherosclerotic PVD  Lipid Panel     Component Value Date/Time   CHOL 202 (H) 12/21/2019 0913   CHOL 200 (H) 08/12/2015 0800   TRIG 73.0 12/21/2019 0913   TRIG 82 08/12/2015 0800   HDL 47.00 12/21/2019 0913   HDL 52 08/12/2015 0800   LDLCALC 141 (H)  12/21/2019 0913   LDLCALC 132 (H) 08/12/2015 0800    Hepatic Function Latest Ref Rng & Units 02/23/2020 12/21/2019 05/03/2019  Total Protein 6.0 - 8.3 g/dL 7.0 6.3 6.3(L)  Albumin 3.5 - 5.2 g/dL 4.3 4.0 3.7  AST 0 - 37 U/L '19 24 24  ' ALT 0 - 35 U/L '15 16 17  ' Alk Phosphatase 39 - 117 U/L 89 82 88  Total Bilirubin 0.2 - 1.2 mg/dL 0.4 0.6 0.8    The 10-year ASCVD risk score Mikey Bussing DC Jr., et al., 2013) is: 12.3%   Values used to calculate the score:     Age: 9 years     Sex: Female     Is Non-Hispanic African American: Yes     Diabetic: No     Tobacco smoker: No     Systolic Blood Pressure: 96 mmHg     Is BP treated: No     HDL Cholesterol: 47 mg/dL     Total Cholesterol: 202 mg/dL   Patient has failed these meds in past: n/a Patient is currently controlled on the following medications:  . Rosuvastatin 10 mg daily . OTC Omega-3 1000 mg daily  We discussed:  diet and  exercise extensively; pt avoids fatty and fried foods, limits sweets, limits salt. Rosuvastatin was started after most recent lipid panel. Next lipid panel will reflect effect of statin. Rosuvastatin 10 mg lowers LDL ~50% so lipids will likely be at goal. Pt endorses compliance, denies side effects.  Plan  Continue current medications  GERD   Patient has failed these meds in past: n/a Patient is currently controlled on the following medications:  . Omeprazole 40 mg BID  We discussed:  Pt has had endoscopy in the past; she avoids tomatoes and other acidic foods. Benefits of PPI outweigh risks at this point.  Plan  Continue current medications  Chronic pain   Osteoarthritis of knee Lumbar compression fracture  Patient has failed these meds in past: duloxetine Patient is currently controlled on the following medications:  . Tylenol PM  PRN . Turmeric 500 mg daily  We discussed:  Pt rarely uses Tylenol and is worried about taking too much. Discussed max daily dose of Tylenol 3000 mg. She also has morning sedation following Tylenol PM dose - discussed the Benadryl component is what causes sedation, Tylenol alone will not.   Also discussed benefits of Voltaren gel for arthritis.  Plan  Switch Tylenol PM to regular Tylenol Try Voltaren gel  Osteoporosis   Last DEXA Scan: 12/18/2018  T-Score femoral neck: -2.8  T-Score total hip: n/a  T-Score lumbar spine: -0.9  T-Score forearm radius: n/a  No results found for: VD25OH   Patient is a candidate for pharmacologic treatment due to T-Score < -2.5 in femoral neck  Patient has failed these meds in past: "bisphosphonate" Patient is currently uncontrolled on the following medications:  . Calcium-Vitamin D 600-200 mg-IU  . Vitamin D 400 IU  We discussed:  Recommend [phone] units of vitamin D daily. Recommend 1200 mg of calcium daily from dietary and supplemental sources. Recommend weight-bearing and muscle strengthening  exercises for building and maintaining bone density.   Repeat DEXA is due Dec 2021. Discussed benefits of Prolia; this would be an appropriate option if repeat DEXA still shows osteoporosis.  Plan  Continue current medications  Consider Prolia if repeat DEXA still shows osteoporosis  Glaucoma   Patient has failed these meds in past: n/a Patient is currently controlled on the following medications:  .  Dorzolamide-Timolol (Cosopt) eye drops BID . Vyzulta (latanoprostene) eye drop . Brimonidine 0.2% eye drops  We discussed:  Pt reports new eye drop (Vyzulta) is very expensive, $400 initially and $200 after deductible, so she is using samples from eye doctor. Pt has enough until f/u in October, may consider PAP if eye drop is continued indefinitely.  Plan  Continue current medications  Consider PAP for Vyzulta if it is is continued indefinitely  Health Maintenance   Patient is currently controlled on the following medications:  Marland Kitchen Miralax . Meclizine 12.5 mg TID prn . Vitamin B12 500 mcg daily . Biotin 1000 mcg daily . Multivitamin  We discussed:  Patient is satisfied with current OTC regimen and denies issues  Plan  Continue current medications  Medication Management   Pt uses [facility] for all medications Uses pill box? No - prefers bottles Pt endorses 100% compliance  We discussed: All medications are $0 through mail order; pt is satisfied with pharmacy services  Plan  Continue current medication management strategy    Follow up: 5 month phone visit  Charlene Brooke, PharmD, [facility] Clinical Pharmacist [facility] [phone]

## 2020-08-26 NOTE — Patient Instructions (Addendum)
Visit Information  Phone number for Pharmacist: [phone]  Thank you for meeting with me to discuss your medications! I look forward to working with you to achieve your health care goals. Below is a summary of what we talked about during the visit:  Goals Addressed            This Visit's Progress   . Pharmacy Care Plan       CARE PLAN ENTRY (see longitudinal plan of care for additional care plan information)  Current Barriers:  . Chronic Disease Management support, education, and care coordination needs related to Hyperlipidemia, Osteoporosis, and Osteoarthritis, Insomnia  Hyperlipidemia Lab Results  Component Value Date/Time   LDLCALC 141 (H) 12/21/2019 09:13 AM   LDLCALC 132 (H) 08/12/2015 08:00 AM .  Pharmacist Clinical Goal(s): o Over the next 150 days, patient will work with PharmD and providers to achieve LDL goal < 70 . Current regimen:  o Rosuvastatin 10 mg daily . Interventions: o Discussed cholesterol goals and benefits of medications for prevention of heart attack / stroke . Patient self care activities - Over the next 150 days, patient will: o Continue medication as prescribed  Osteoarthritis . Pharmacist Clinical Goal(s) o Over the next 150 days, patient will work with PharmD and providers to optimize therapy . Current regimen:  o Tylenol PM as needed . Interventions: o Discussed "PM" portion is Benadryl which causes sleepiness. Plain Tylenol will not cause sleepiness. o Recommend Tylenol 500 mg as needed for arthritis pain o Discussed benefits of Voltaren gel for arthritic joints . Patient self care activities - Over the next 150 days, patient will: o Use regular Tylenol Extra Strength (not PM) 1-2 tablets up to 3 times daily as needed o Recommend Voltaren gel up to 4 times daily as needed  Osteoporosis . Pharmacist Clinical Goal(s) o Over the next 150 days, patient will work with PharmD and providers to optimize therapy . Current regimen:   o Calcium-Vitamin D o Vitamin D . Interventions: o Recommend 1000-2000 IU of Vitamin D per day o Recommend 1200 mg of calcium from dietary and supplemental sources o Discussed repeat DEXA is due December 2021 to assess bone healthy o Discussed Prolia injection every 6 months may be beneficial if osteoporosis is still present on repeat DEXA . Patient self care activities - Over the next 150 days, patient will: o Follow up with PCP as scheduled  Insomnia . Pharmacist Clinical Goal(s) o Over the next 150 days, patient will work with PharmD and providers to optimize therapy . Current regimen:  o No medications . Interventions: o Discussed sleep hygiene - avoiding screens within 30 minutes of bedtime o Discussed benefits of melatonin for sleep . Patient self care activities - Over the next 150 days, patient will: o Try melatonin 1 mg HS. May increase up to 2 tablets if no benefit. Maximum dose 10 mg  Medication management . Pharmacist Clinical Goal(s): o Over the next 150 days, patient will work with PharmD and providers to maintain optimal medication adherence . Current pharmacy: [facility] . Interventions o Comprehensive medication review performed. o Continue current medication management strategy . Patient self care activities - Over the next 150 days, patient will: o Focus on medication adherence by fill date o Take medications as prescribed o Report any questions or concerns to PharmD and/or provider(s)  Initial goal documentation      Ms. Stoltz was given information about Chronic Care Management services today including:  1. CCM service includes personalized support  from designated clinical staff supervised by her physician, including individualized plan of care and coordination with other care providers 2. 24/7 contact phone numbers for assistance for urgent and routine care needs. 3. Standard insurance, coinsurance, copays and deductibles apply for chronic care management  only during months in which we provide at least 20 minutes of these services. Most insurances cover these services at 100%, however patients may be responsible for any copay, coinsurance and/or deductible if applicable. This service may help you avoid the need for more expensive face-to-face services. 4. Only one practitioner may furnish and bill the service in a calendar month. 5. The patient may stop CCM services at any time (effective at the end of the month) by phone call to the office staff.  Patient agreed to services and verbal consent obtained.   Patient verbalizes understanding of instructions provided today.  The pharmacy team will reach out to the patient again over the next 90 days.   Charlene Brooke, PharmD, BCACP Clinical Pharmacist [facility] at [facility] [phone]  Quality Sleep Information, Adult Quality sleep is important for your mental and physical health. It also improves your quality of life. Quality sleep means you:  Are asleep for most of the time you are in bed.  Fall asleep within 30 minutes.  Wake up no more than once a night.  Are awake for no longer than 20 minutes if you do wake up during the night. Most adults need 7-8 hours of quality sleep each night. How can poor sleep affect me? If you do not get enough quality sleep, you may have:  Mood swings.  Daytime sleepiness.  Confusion.  Decreased reaction time.  Sleep disorders, such as insomnia and sleep apnea.  Difficulty with: ? Solving problems. ? Coping with stress. ? Paying attention. These issues may affect your performance and productivity at work, school, and at home. Lack of sleep may also put you at higher risk for accidents, suicide, and risky behaviors. If you do not get quality sleep you may also be at higher risk for several health problems, including:  Infections.  Type 2 diabetes.  Heart disease.  High blood pressure.  Obesity.  Worsening of  long-term conditions, like arthritis, kidney disease, depression, Parkinson's disease, and epilepsy. What actions can I take to get more quality sleep?      Stick to a sleep schedule. Go to sleep and wake up at about the same time each day. Do not try to sleep less on weekdays and make up for lost sleep on weekends. This does not work.  Try to get about 30 minutes of exercise on most days. Do not exercise 2-3 hours before going to bed.  Limit naps during the day to 30 minutes or less.  Do not use any products that contain nicotine or tobacco, such as cigarettes or e-cigarettes. If you need help quitting, ask your health care provider.  Do not drink caffeinated beverages for at least 8 hours before going to bed. Coffee, tea, and some sodas contain caffeine.  Do not drink alcohol close to bedtime.  Do not eat large meals close to bedtime.  Do not take naps in the late afternoon.  Try to get at least 30 minutes of sunlight every day. Morning sunlight is best.  Make time to relax before bed. Reading, listening to music, or taking a hot bath promotes quality sleep.  Make your bedroom a place that promotes quality sleep. Keep your bedroom dark, quiet, and at  a comfortable room temperature. Make sure your bed is comfortable. Take out sleep distractions like TV, a computer, smartphone, and bright lights.  If you are lying awake in bed for longer than 20 minutes, get up and do a relaxing activity until you feel sleepy.  Work with your health care provider to treat medical conditions that may affect sleeping, such as: ? Nasal obstruction. ? Snoring. ? Sleep apnea and other sleep disorders.  Talk to your health care provider if you think any of your prescription medicines may cause you to have difficulty falling or staying asleep.  If you have sleep problems, talk with a sleep consultant. If you think you have a sleep disorder, talk with your health care provider about getting evaluated by  a specialist. Where to find more information  [place] website: https://sleepfoundation.org  National Heart, Lung, and [place] ([place]): http://www.saunders.info/.pdf  Centers for Disease Control and Prevention (CDC): LearningDermatology.pl Contact a health care provider if you:  Have trouble getting to sleep or staying asleep.  Often wake up very early in the morning and cannot get back to sleep.  Have daytime sleepiness.  Have daytime sleep attacks of suddenly falling asleep and sudden muscle weakness (narcolepsy).  Have a tingling sensation in your legs with a strong urge to move your legs (restless legs syndrome).  Stop breathing briefly during sleep (sleep apnea).  Think you have a sleep disorder or are taking a medicine that is affecting your quality of sleep. Summary  Most adults need 7-8 hours of quality sleep each night.  Getting enough quality sleep is an important part of health and well-being.  Make your bedroom a place that promotes quality sleep and avoid things that may cause you to have poor sleep, such as alcohol, caffeine, smoking, and large meals.  Talk to your health care provider if you have trouble falling asleep or staying asleep. This information is not intended to replace advice given to you by your health care provider. Make sure you discuss any questions you have with your health care provider. Document Revised: 03/26/2018 Document Reviewed: 03/26/2018 Elsevier Patient Education  [place].

## 2020-08-29 NOTE — Addendum Note (Signed)
Addended by: Juliet Rude on: 08/29/2020 09:39 AM   Modules accepted: Orders

## 2020-08-29 NOTE — Addendum Note (Signed)
Addended by: Karle Barr on: 08/29/2020 09:31 AM   Modules accepted: Orders

## 2020-09-14 DIAGNOSIS — Z23 Encounter for immunization: Secondary | ICD-10-CM | POA: Diagnosis not present

## 2020-10-17 ENCOUNTER — Encounter: Payer: Self-pay | Admitting: Family Medicine

## 2020-10-17 ENCOUNTER — Ambulatory Visit (INDEPENDENT_AMBULATORY_CARE_PROVIDER_SITE_OTHER): Payer: Medicare Other | Admitting: Family Medicine

## 2020-10-17 ENCOUNTER — Other Ambulatory Visit: Payer: Self-pay

## 2020-10-17 VITALS — BP 102/64 | HR 63 | Ht 64.0 in | Wt 138.2 lb

## 2020-10-17 DIAGNOSIS — G8929 Other chronic pain: Secondary | ICD-10-CM | POA: Diagnosis not present

## 2020-10-17 DIAGNOSIS — R1031 Right lower quadrant pain: Secondary | ICD-10-CM | POA: Diagnosis not present

## 2020-10-17 DIAGNOSIS — R1011 Right upper quadrant pain: Secondary | ICD-10-CM

## 2020-10-17 DIAGNOSIS — I70209 Unspecified atherosclerosis of native arteries of extremities, unspecified extremity: Secondary | ICD-10-CM

## 2020-10-17 DIAGNOSIS — R109 Unspecified abdominal pain: Secondary | ICD-10-CM

## 2020-10-17 MED ORDER — DULOXETINE HCL 20 MG PO CPEP: 20.0000 mg | ORAL_CAPSULE | Freq: Every day | ORAL | 2 refills | Status: DC

## 2020-10-17 NOTE — Progress Notes (Signed)
   I, Wendy Poet, LAT, ATC, am serving as scribe for Dr. Lynne Leader.  Wanda Rivera is a 79 y.o. female who presents to [place] at [facility] today for f/u of RLQ pain.  She was last seen by Dr. Georgina Snell on 05/19/20 and noted improvement in her symptoms.  She has completed 3 PT sessions and was prescribed Cymbalta.  Since her last visit, pt reports her pain came back about 2 weeks ago.  It has been intermittent in nature.  She has been taking Tylenol.  She is no longer taking the Cymbalta as her prescription has run out.  She also reports some intermittent nausea that may or may not be associated w/ the RLQ pain.  Diagnostic imaging: Abdominal US- 04/27/20; T-spine XR- 04/14/20; CT abdomen/pelvis- 03/11/20   Pertinent review of systems: No fevers or chills  Relevant historical information: Breast cancer history   Exam:  BP 102/64 (BP Location: Right Arm, Patient Position: Sitting, Cuff Size: Normal)   Pulse 63   Ht 5\' 4"  (1.626 m)   Wt 138 lb 3.2 oz (62.7 kg)   SpO2 97%   BMI 23.72 kg/m  General: Well Developed, well nourished, and in no acute distress.   Abdomen: Normal-appearing normal active bowel sounds.  Not particularly tender to palpation.  No rebound or guarding.    Lab and Radiology Results  Abdominal ultrasound dated April 2021 significant only for small gallbladder polyp. CT scan abdomen and pelvis dated March 2021 showed no evidence of bowel obstruction.  Mild to moderate stool burden mild constipation.  No metastatic disease.  Old compression fracture at L3 visible unchanged. X-ray T-spine dated April 14, 2020 normal X-ray images as above personally and independently interpreted.  Patient had normal EGD and colonoscopy 2017    Assessment and Plan: 79 y.o. female with intermittent abdominal pain predominantly right upper and right lower quadrant.  This has been intermittent but ongoing for over half a year at this point.  Discussed options.  I  think at this point reasonable to have consultation with her gastroenterologist.  Will refer back to gastroenterology for further evaluation and management.  She may be a good candidate for repeat colonoscopy and EGD.  She also may be a good candidate for repeat CT scan given her cancer history.  Overthink the likely diagnosis here is probably IBS.  She had good response to Cymbalta in the past.  We will restart that.  If IBS seen and not controlled with Cymbalta may benefit from other medicine management.  Recheck back with me as needed although think this is less likely a sports medicine related issue.   PDMP not reviewed this encounter. Orders Placed This Encounter  Procedures  . Ambulatory referral to Gastroenterology    Referral Priority:   Routine    Referral Type:   Consultation    Referral Reason:   Specialty Services Required    Referred to Provider:   Mauri Pole, MD    Number of Visits Requested:   1   Meds ordered this encounter  Medications  . DULoxetine (CYMBALTA) 20 MG capsule    Sig: Take 1 capsule (20 mg total) by mouth daily.    Dispense:  90 capsule    Refill:  2     Discussed warning signs or symptoms. Please see discharge instructions. Patient expresses understanding.   The above documentation has been reviewed and is accurate and complete Lynne Leader, M.D.

## 2020-10-17 NOTE — Patient Instructions (Addendum)
Thank you for coming in today.  Follow up gastroenterology.  OK to restart cymbalta.  I will continue the refill the cymbata into the future if needed.   Recheck with me as needed.   I think this may be IBS but you will need further evaluation to make this diagnosis.    Irritable Bowel Syndrome, Adult  Irritable bowel syndrome (IBS) is a group of symptoms that affects the organs responsible for digestion (gastrointestinal or GI tract). IBS is not one specific disease. To regulate how the GI tract works, the body sends signals back and forth between the intestines and the brain. If you have IBS, there may be a problem with these signals. As a result, the GI tract does not function normally. The intestines may become more sensitive and overreact to certain things. This may be especially true when you eat certain foods or when you are under stress. There are four types of IBS. These may be determined based on the consistency of your stool (feces):  IBS with diarrhea.  IBS with constipation.  Mixed IBS.  Unsubtyped IBS. It is important to know which type of IBS you have. Certain treatments are more likely to be helpful for certain types of IBS. What are the causes? The exact cause of IBS is not known. What increases the risk? You may have a higher risk for IBS if you:  Are female.  Are younger than 68.  Have a family history of IBS.  Have a mental health condition, such as depression, anxiety, or post-traumatic stress disorder.  Have had a bacterial infection of your GI tract. What are the signs or symptoms? Symptoms of IBS vary from person to person. The main symptom is abdominal pain or discomfort. Other symptoms usually include one or more of the following:  Diarrhea, constipation, or both.  Abdominal swelling or bloating.  Feeling full after eating a small or regular-sized meal.  Frequent gas.  Mucus in the stool.  A feeling of having more stool left after a bowel  movement. Symptoms tend to come and go. They may be triggered by stress, mental health conditions, or certain foods. How is this diagnosed? This condition may be diagnosed based on a physical exam, your medical history, and your symptoms. You may have tests, such as:  Blood tests.  Stool test.  X-rays.  CT scan.  Colonoscopy. This is a procedure in which your GI tract is viewed with a long, thin, flexible tube. How is this treated? There is no cure for IBS, but treatment can help relieve symptoms. Treatment depends on the type of IBS you have, and may include:  Changes to your diet, such as: ? Avoiding foods that cause symptoms. ? Drinking more water. ? Following a low-FODMAP (fermentable oligosaccharides, disaccharides, monosaccharides, and polyols) diet for up to 6 weeks, or as told by your health care provider. FODMAPs are sugars that are hard for some people to digest. ? Eating more fiber. ? Eating medium-sized meals at the same times every day.  Medicines. These may include: ? Fiber supplements, if you have constipation. ? Medicine to control diarrhea (antidiarrheal medicines). ? Medicine to help control muscle tightening (spasms) in your GI tract (antispasmodic medicines). ? Medicines to help with mental health conditions, such as antidepressants or tranquilizers.  Talk therapy or counseling.  Working with a diet and nutrition specialist (dietitian) to help create a food plan that is right for you.  Managing your stress. Follow these instructions at home: Eating and  drinking  Eat a healthy diet.  Eat medium-sized meals at about the same time every day. Do not eat large meals.  Gradually eat more fiber-rich foods. These include whole grains, fruits, and vegetables. This may be especially helpful if you have IBS with constipation.  Eat a diet low in FODMAPs.  Drink enough fluid to keep your urine pale yellow.  Keep a journal of foods that seem to trigger  symptoms.  Avoid foods and drinks that: ? Contain added sugar. ? Make your symptoms worse. Dairy products, caffeinated drinks, and carbonated drinks can make symptoms worse for some people. General instructions  Take over-the-counter and prescription medicines and supplements only as told by your health care provider.  Get enough exercise. Do at least 150 minutes of moderate-intensity exercise each week.  Manage your stress. Getting enough sleep and exercise can help you manage stress.  Keep all follow-up visits as told by your health care provider and therapist. This is important. Alcohol Use  Do not drink alcohol if: ? Your health care provider tells you not to drink. ? You are pregnant, may be pregnant, or are planning to become pregnant.  If you drink alcohol, limit how much you have: ? 0-1 drink a day for women. ? 0-2 drinks a day for men.  Be aware of how much alcohol is in your drink. In the [place] one drink equals one typical bottle of beer (12 oz), one-half glass of wine (5 oz), or one shot of hard liquor (1 oz). Contact a health care provider if you have:  Constant pain.  Weight loss.  Difficulty or pain when swallowing.  Diarrhea that gets worse. Get help right away if you have:  Severe abdominal pain.  Fever.  Diarrhea with symptoms of dehydration, such as dizziness or dry mouth.  Bright red blood in your stool.  Stool that is black and tarry.  Abdominal swelling.  Vomiting that does not stop.  Blood in your vomit. Summary  Irritable bowel syndrome (IBS) is not one specific disease. It is a group of symptoms that affects digestion.  Your intestines may become more sensitive and overreact to certain things. This may be especially true when you eat certain foods or when you are under stress.  There is no cure for IBS, but treatment can help relieve symptoms. This information is not intended to replace advice given to you by your health care  provider. Make sure you discuss any questions you have with your health care provider. Document Revised: 12/10/2017 Document Reviewed: 12/10/2017 Elsevier Patient Education  2020 [employer].

## 2020-10-19 DIAGNOSIS — H401111 Primary open-angle glaucoma, right eye, mild stage: Secondary | ICD-10-CM | POA: Diagnosis not present

## 2020-11-01 ENCOUNTER — Telehealth: Payer: Self-pay | Admitting: Gastroenterology

## 2020-11-01 DIAGNOSIS — Z23 Encounter for immunization: Secondary | ICD-10-CM | POA: Diagnosis not present

## 2020-11-01 NOTE — Telephone Encounter (Signed)
Primary provider is sending her back for evaluation of her Right sided pain. She is on Cymbalta which helps some. She continues to have indigestion, bloating and constipation. Appointment scheduled for evaluation and treatment.

## 2020-11-04 ENCOUNTER — Ambulatory Visit (INDEPENDENT_AMBULATORY_CARE_PROVIDER_SITE_OTHER): Payer: Medicare Other | Admitting: Gastroenterology

## 2020-11-04 ENCOUNTER — Encounter: Payer: Self-pay | Admitting: Gastroenterology

## 2020-11-04 VITALS — BP 142/80 | HR 72 | Ht 64.0 in | Wt 137.2 lb

## 2020-11-04 DIAGNOSIS — K824 Cholesterolosis of gallbladder: Secondary | ICD-10-CM

## 2020-11-04 DIAGNOSIS — K219 Gastro-esophageal reflux disease without esophagitis: Secondary | ICD-10-CM | POA: Diagnosis not present

## 2020-11-04 DIAGNOSIS — R112 Nausea with vomiting, unspecified: Secondary | ICD-10-CM | POA: Diagnosis not present

## 2020-11-04 DIAGNOSIS — K581 Irritable bowel syndrome with constipation: Secondary | ICD-10-CM

## 2020-11-04 DIAGNOSIS — I70209 Unspecified atherosclerosis of native arteries of extremities, unspecified extremity: Secondary | ICD-10-CM | POA: Diagnosis not present

## 2020-11-04 MED ORDER — DICYCLOMINE HCL 10 MG PO CAPS: 10.0000 mg | ORAL_CAPSULE | Freq: Two times a day (BID) | ORAL | 1 refills | Status: DC | PRN

## 2020-11-04 NOTE — Patient Instructions (Addendum)
If you are age 78 or older, your body mass index should be between 23-30. Your Body mass index is 23.56 kg/m. If this is out of the aforementioned range listed, please consider follow up with your Primary Care Provider.  If you are age 13 or younger, your body mass index should be between 19-25. Your Body mass index is 23.56 kg/m. If this is out of the aformentioned range listed, please consider follow up with your Primary Care Provider.    You have been scheduled for a HIDA scan at [facility] (1st floor) on 11-25-20 at 730 am. Please arrive 15 minutes prior to your scheduled appointment at  1:70YF Make certain not to have anything to eat or drink at least 6 hours prior to your test. Should this appointment date or time not work well for you, please call radiology scheduling at [phone].  _________________________________________________________ hepatobiliary (HIDA) scan is an imaging procedure used to diagnose problems in the liver, gallbladder and bile ducts. In the HIDA scan, a radioactive chemical or tracer is injected into a vein in your arm. The tracer is handled by the liver like bile. Bile is a fluid produced and excreted by your liver that helps your digestive system break down fats in the foods you eat. Bile is stored in your gallbladder and the gallbladder releases the bile when you eat a meal. A special nuclear medicine scanner (gamma camera) tracks the flow of the tracer from your liver into your gallbladder and small intestine.  During your HIDA scan  You'll be asked to change into a hospital gown before your HIDA scan begins. Your health care team will position you on a table, usually on your back. The radioactive tracer is then injected into a vein in your arm.The tracer travels through your bloodstream to your liver, where it's taken up by the bile-producing cells. The radioactive tracer travels with the bile from your liver into your gallbladder and through your bile ducts  to your small intestine.You may feel some pressure while the radioactive tracer is injected into your vein. As you lie on the table, a special gamma camera is positioned over your abdomen taking pictures of the tracer as it moves through your body. The gamma camera takes pictures continually for about an hour. You'll need to keep still during the HIDA scan. This can become uncomfortable, but you may find that you can lessen the discomfort by taking deep breaths and thinking about other things. Tell your health care team if you're uncomfortable. The radiologist will watch on a computer the progress of the radioactive tracer through your body. The HIDA scan may be stopped when the radioactive tracer is seen in the gallbladder and enters your small intestine. This typically takes about an hour. In some cases extra imaging will be performed if original images aren't satisfactory, if morphine is given to help visualize the gallbladder or if the medication CCK is given to look at the contraction of the gallbladder. This test typically takes 2 hours to complete. _________________________________________________________  We have sent the following medications to your pharmacy for you to pick up at your convenience: Dicyclomine( sent to mail order pharmacy.)  START: Linzess 134mcg once daily-( Use samples first). if this works please contact office and we will send mail order prescriptions.   Thank you for choosing me and [facility].  Dr. Silverio Decamp

## 2020-11-04 NOTE — Progress Notes (Signed)
Wanda Rivera    [id number]    10/07/1941  Primary Care Physician:Crawford, Real Cons, MD  Referring Physician: Hoyt Koch, MD [address] [place],  [place] [address]   Chief complaint: Right upper quadrant abdominal pain, constipation  HPI:  79 year old female here for follow-up visit for right upper quadrant abdominal pain, constipation, nausea and GERD.    GERD symptoms improved with omeprazole.  She has intermittent nausea.  She complains of right upper quadrant abdominal pain on and off for past few months  Continues to have intermittent constipation, no improvement with MiraLAX.  Denies any rectal bleeding, melena or vomiting.    Right upper quadrant ultrasound April 27, 2020: Diminutive 2 mm gallbladder polyp otherwise no cholecystitis  CT abdomen pelvis March 11, 2020: Mild to moderate colonic stool burden otherwise unremarkable  Dr. Sharlet Salina referred her to Dr. Georgina Snell at [facility] medicine for possible musculoskeletal pain, underwent physical therapy and was also started on Cymbalta.  She noticed improvement but her symptoms recurred after she ran out of Cymbalta prescription  EGD February 20, 2017: Normal esophagus, mild gastritis biopsies negative for H. pylori, duodenal erosions otherwise normal.  Colonoscopy March 13, 2016: No polyps normal exam, recall colonoscopy in 10 years   Outpatient Encounter Medications as of 11/04/2020  Medication Sig  . acetaminophen (TYLENOL) 500 MG tablet Take 1,000 mg by mouth every 6 (six) hours as needed for moderate pain or headache.   Marland Kitchen BIOTIN PO Take 1,000 mcg by mouth daily.   . brimonidine (ALPHAGAN) 0.2 % ophthalmic solution Place 1 drop into both eyes in the morning and at bedtime.  . Calcium Carb-Cholecalciferol (CALCIUM + D3) 600-200 MG-UNIT TABS Take 1 tablet by mouth 2 (two) times daily.   . Cholecalciferol (VITAMIN D3) 400 UNITS CAPS Take 400 Units by mouth daily.   . Cyanocobalamin (VITAMIN B  12 PO) Take 500 mg by mouth daily.  Marland Kitchen DIGESTIVE ENZYMES PO Take 1 capsule by mouth daily.   . dorzolamide-timolol (COSOPT) 22.3-6.8 MG/ML ophthalmic solution Place 1 drop into both eyes 2 (two) times daily.   . DULoxetine (CYMBALTA) 20 MG capsule Take 1 capsule (20 mg total) by mouth daily.  Marland Kitchen latanoprost (XALATAN) 0.005 % ophthalmic solution Place 1 drop into both eyes at bedtime.   . meclizine (ANTIVERT) 12.5 MG tablet Take 12.5 mg by mouth 3 (three) times daily as needed for dizziness.   . Multiple Vitamin (ONE-A-DAY ESSENTIAL PO) Take by mouth daily.  . Omega-3 1000 MG CAPS Take by mouth.  Marland Kitchen omeprazole (PRILOSEC) 40 MG capsule Take 1 capsule (40 mg total) by mouth 2 (two) times daily.  . polyethylene glycol powder (GLYCOLAX/MIRALAX) powder Take 17 g by mouth 2 (two) times daily as needed.  . rosuvastatin (CRESTOR) 10 MG tablet Take 1 tablet (10 mg total) by mouth daily.  . Turmeric 500 MG TABS Take by mouth daily.  Marland Kitchen dicyclomine (BENTYL) 10 MG capsule Take 1 capsule (10 mg total) by mouth 2 (two) times daily as needed for spasms.   No facility-administered encounter medications on file as of 11/04/2020.    Allergies as of 11/04/2020 - Review Complete 11/04/2020  Allergen Reaction Noted  . Darvon [propoxyphene]  02/28/2016  . Morphine and related  02/28/2016  . Percocet [oxycodone-acetaminophen]  02/28/2016  . Vicodin [hydrocodone-acetaminophen]  02/28/2016    Past Medical History:  Diagnosis Date  . 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 Breast cancer ([place])  1982, recurrence in 1985  . Cataract    Bil  . Glaucoma    Bil  . Osteoporosis     Past Surgical History:  Procedure Laterality Date  . ABDOMINAL HYSTERECTOMY    . BREAST SURGERY     double masectomy for breast cancer  . eye surgery Bilateral 06/05/2017   for Glaucoma    Family History  Problem Relation Age of Onset  . Cancer Mother   . Breast cancer Mother   . Liver cancer Mother   . Diabetes Father   .  Hypertension Father   . Hypertension Sister   . Diabetes Brother   . Heart disease Brother     Social History   Socioeconomic History  . Marital status: Divorced    Spouse name: Not on file  . Number of children: 1  . Years of education: Not on file  . Highest education level: Not on file  Occupational History  . Occupation: retired  Tobacco Use  . Smoking status: Never Smoker  . Smokeless tobacco: Never Used  Vaping Use  . Vaping Use: Never used  Substance and Sexual Activity  . Alcohol use: Yes    Alcohol/week: 0.0 standard drinks    Comment: occasionally a glass of red wine  . Drug use: No  . Sexual activity: Never  Other Topics Concern  . Not on file  Social History Narrative  . Not on file   Social Determinants of Health   Financial Resource Strain: Low Risk   . Difficulty of Paying Living Expenses: Not very hard  Food Insecurity:   . Worried About Charity fundraiser in the Last Year: Not on file  . Ran Out of Food in the Last Year: Not on file  Transportation Needs:   . Lack of Transportation (Medical): Not on file  . Lack of Transportation (Non-Medical): Not on file  Physical Activity:   . Days of Exercise per Week: Not on file  . Minutes of Exercise per Session: Not on file  Stress:   . Feeling of Stress : Not on file  Social Connections:   . Frequency of Communication with Friends and Family: Not on file  . Frequency of Social Gatherings with Friends and Family: Not on file  . Attends Religious Services: Not on file  . Active Member of Clubs or Organizations: Not on file  . Attends Archivist Meetings: Not on file  . Marital Status: Not on file  Intimate Partner Violence:   . Fear of Current or Ex-Partner: Not on file  . Emotionally Abused: Not on file  . Physically Abused: Not on file  . Sexually Abused: Not on file      Review of systems: All other review of systems negative except as mentioned in the HPI.   Physical  Exam: Vitals:   11/04/20 1454  BP: (!) 142/80  Pulse: 72   Body mass index is 23.56 kg/m. Gen:      No acute distress HEENT:  sclera anicteric Abd:      soft, non-tender; no palpable masses, no distension Ext:    No edema Neuro: alert and oriented x 3 Psych: normal mood and affect  Data Reviewed:  Reviewed labs, radiology imaging, old records and pertinent past GI work up   Assessment and Plan/Recommendations:  79 year old very pleasant female with history of chronic GERD, chronic  constipation, nausea and right upper quadrant abdominal pain  Right upper quadrant abdominal pain: Will obtain HIDA scan  to exclude gallbladder dysfunction or possible chronic cholecystitis Reassured patient that the gallbladder polyp was 2 mm and was benign appearing.  Will repeat abdominal ultrasound May 2022 for surveillance  Possible irritable bowel syndrome with abdominal cramping and spasms: Trial of dicyclomine 10 mg every 8 hours as needed  IBS predominant constipation: Start Linzess 145 mcg daily, will titrate the dose based on response Increase dietary fiber and water intake  This visit required >40 minutes of patient care (this includes precharting, chart review, review of results, face-to-face time used for counseling as well as treatment plan and follow-up. The patient was provided an opportunity to ask questions and all were answered. The patient agreed with the plan and demonstrated an understanding of the instructions.  Damaris Hippo , MD    CC: Hoyt Koch, *

## 2020-11-10 ENCOUNTER — Other Ambulatory Visit: Payer: Self-pay

## 2020-11-10 ENCOUNTER — Emergency Department (HOSPITAL_COMMUNITY): Payer: Medicare Other

## 2020-11-10 ENCOUNTER — Encounter (HOSPITAL_COMMUNITY): Payer: Self-pay

## 2020-11-10 DIAGNOSIS — R930 Abnormal findings on diagnostic imaging of skull and head, not elsewhere classified: Secondary | ICD-10-CM | POA: Insufficient documentation

## 2020-11-10 DIAGNOSIS — R079 Chest pain, unspecified: Secondary | ICD-10-CM | POA: Diagnosis not present

## 2020-11-10 DIAGNOSIS — W19XXXA Unspecified fall, initial encounter: Secondary | ICD-10-CM | POA: Diagnosis not present

## 2020-11-10 DIAGNOSIS — R0781 Pleurodynia: Secondary | ICD-10-CM | POA: Diagnosis not present

## 2020-11-10 DIAGNOSIS — Z043 Encounter for examination and observation following other accident: Secondary | ICD-10-CM | POA: Diagnosis not present

## 2020-11-10 DIAGNOSIS — S0990XA Unspecified injury of head, initial encounter: Secondary | ICD-10-CM | POA: Diagnosis not present

## 2020-11-10 DIAGNOSIS — Z853 Personal history of malignant neoplasm of breast: Secondary | ICD-10-CM | POA: Insufficient documentation

## 2020-11-10 NOTE — ED Triage Notes (Signed)
Pt reports she fell earlier this evening and hit her right side and her head. Denies blood thinners. Pt now endorses right sided pain.

## 2020-11-11 ENCOUNTER — Emergency Department (HOSPITAL_COMMUNITY): Payer: Medicare Other

## 2020-11-11 ENCOUNTER — Emergency Department (HOSPITAL_COMMUNITY)
Admission: EM | Admit: 2020-11-11 | Discharge: 2020-11-11 | Disposition: A | Discharge status: Home / Self Care | Payer: Medicare Other | Attending: Emergency Medicine | Admitting: Emergency Medicine

## 2020-11-11 ENCOUNTER — Encounter (HOSPITAL_COMMUNITY): Payer: Self-pay | Admitting: Emergency Medicine

## 2020-11-11 DIAGNOSIS — R079 Chest pain, unspecified: Secondary | ICD-10-CM | POA: Diagnosis not present

## 2020-11-11 DIAGNOSIS — Z043 Encounter for examination and observation following other accident: Secondary | ICD-10-CM | POA: Diagnosis not present

## 2020-11-11 DIAGNOSIS — R0781 Pleurodynia: Secondary | ICD-10-CM | POA: Diagnosis not present

## 2020-11-11 DIAGNOSIS — W19XXXA Unspecified fall, initial encounter: Secondary | ICD-10-CM

## 2020-11-11 MED ORDER — LIDOCAINE 5 % EX PTCH
2.0000 | MEDICATED_PATCH | CUTANEOUS | Status: DC
  Administered 2020-11-11: 2 via TRANSDERMAL
  Filled 2020-11-11: qty 2

## 2020-11-11 MED ORDER — DICLOFENAC SODIUM 1 % EX GEL: 4.0000 g | Freq: Four times a day (QID) | CUTANEOUS | 0 refills | Status: AC

## 2020-11-11 MED ORDER — LIDOCAINE 5 % EX PTCH: 1.0000 | MEDICATED_PATCH | CUTANEOUS | 0 refills | Status: AC

## 2020-11-11 NOTE — ED Provider Notes (Signed)
[facility] DEPT Provider Note   CSN: [id number] Arrival date & time: 11/10/20  2145     History Chief Complaint  Patient presents with  . Fall    Wanda Rivera is a 79 y.o. female.  The history is provided by the patient.  Fall This is a new problem. The current episode started 3 to 5 hours ago. The problem occurs constantly. The problem has been resolved. Pertinent negatives include no chest pain, no abdominal pain, no headaches and no shortness of breath. Associated symptoms comments: Right rib pain . Nothing aggravates the symptoms. Nothing relieves the symptoms. She has tried nothing for the symptoms. The treatment provided no relief.  Patient states she is "clumsy" and lost her balance striking right ribs and head on refrigerator.  No LOC.  No weakness, no numbness. No seizures.  No emesis.      Past Medical History:  Diagnosis Date  . 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 Breast cancer ([place])    1982, recurrence in 1985  . Cataract    Bil  . Glaucoma    Bil  . Osteoporosis     Patient Active Problem List   Diagnosis Date Noted  . RUQ pain 02/23/2020  . Dysphagia 08/19/2019  . Primary osteoarthritis of right knee 12/26/2018  . Cough 12/19/2018  . Constipation 12/19/2018  . GERD (gastroesophageal reflux disease) 12/13/2017  . History of breast cancer 01/02/2017  . Compression fracture of L3 lumbar vertebra 05/04/2016  . Hyperlipidemia 08/16/2015  . Left carotid bruit 08/16/2015  . 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 Routine general medical examination at a health care facility 12/09/2014  . Osteopenia 12/09/2014  . Glaucoma 12/09/2014    Past Surgical History:  Procedure Laterality Date  . ABDOMINAL HYSTERECTOMY    . BREAST SURGERY     double masectomy for breast cancer  . eye surgery Bilateral 06/05/2017   for Glaucoma     OB History   No obstetric history on file.     Family History    Problem Relation Age of Onset  . Cancer Mother   . Breast cancer Mother   . Liver cancer Mother   . Diabetes Father   . Hypertension Father   . Hypertension Sister   . Diabetes Brother   . Heart disease Brother     Social History   Tobacco Use  . Smoking status: Never Smoker  . Smokeless tobacco: Never Used  Vaping Use  . Vaping Use: Never used  Substance Use Topics  . Alcohol use: Yes    Alcohol/week: 0.0 standard drinks    Comment: occasionally a glass of red wine  . Drug use: No    Home Medications Prior to Admission medications   Medication Sig Start Date End Date Taking? Authorizing Provider  acetaminophen (TYLENOL) 500 MG tablet Take 1,000 mg by mouth every 6 (six) hours as needed for moderate pain or headache.    Yes [provider]  BIOTIN PO Take 1,000 mcg by mouth daily.    Yes [provider]  brimonidine (ALPHAGAN) 0.2 % ophthalmic solution Place 1 drop into both eyes in the morning and at bedtime. 08/22/20  Yes [provider]  Calcium Carb-Cholecalciferol (CALCIUM + D3) 600-200 MG-UNIT TABS Take 1 tablet by mouth 2 (two) times daily.    Yes [provider]  Cholecalciferol (VITAMIN D3) 400 UNITS CAPS Take 400 Units by mouth daily.    Yes [provider]  Cyanocobalamin (VITAMIN B 12 PO) Take 500 mg by mouth daily.   Yes [provider]  dicyclomine (BENTYL) 10 MG capsule Take 1 capsule (10 mg total) by mouth 2 (two) times daily as needed for spasms. 11/04/20  Yes Nandigam, Venia Minks, MD  DIGESTIVE ENZYMES PO Take 1 capsule by mouth daily.    Yes [provider]  dorzolamide-timolol (COSOPT) 22.3-6.8 MG/ML ophthalmic solution Place 1 drop into both eyes 2 (two) times daily.  12/03/14  Yes [provider]  DULoxetine (CYMBALTA) 20 MG capsule Take 1 capsule (20 mg total) by mouth daily. 10/17/20  Yes Gregor Hams, MD  latanoprost (XALATAN) 0.005 % ophthalmic solution Place 1 drop into both eyes at  bedtime.  12/28/17  Yes [provider]  meclizine (ANTIVERT) 12.5 MG tablet Take 12.5 mg by mouth 3 (three) times daily as needed for dizziness.    Yes [provider]  Multiple Vitamin (ONE-A-DAY ESSENTIAL PO) Take by mouth daily.   Yes [provider]  Omega-3 1000 MG CAPS Take 1 capsule by mouth daily.    Yes [provider]  omeprazole (PRILOSEC) 40 MG capsule Take 1 capsule (40 mg total) by mouth 2 (two) times daily. 10/09/19  Yes Nandigam, Venia Minks, MD  polyethylene glycol powder (GLYCOLAX/MIRALAX) powder Take 17 g by mouth 2 (two) times daily as needed. Patient taking differently: Take 17 g by mouth 2 (two) times daily as needed for mild constipation.  12/17/18  Yes Hoyt Koch, MD  rosuvastatin (CRESTOR) 10 MG tablet Take 1 tablet (10 mg total) by mouth daily. 12/21/19  Yes Hoyt Koch, MD  Turmeric 500 MG TABS Take 1 tablet by mouth daily.    Yes [provider]    Allergies    Darvon [propoxyphene], Morphine and related, Percocet [oxycodone-acetaminophen], and Vicodin [hydrocodone-acetaminophen]  Review of Systems   Review of Systems  Constitutional: Negative for fever.  HENT: Negative for congestion.   Eyes: Negative for visual disturbance.  Respiratory: Negative for shortness of breath.   Cardiovascular: Negative for chest pain.  Gastrointestinal: Negative for abdominal pain and vomiting.  Genitourinary: Negative for difficulty urinating.  Musculoskeletal: Positive for arthralgias.  Skin: Negative for rash.  Neurological: Negative for seizures, syncope and headaches.  Psychiatric/Behavioral: Negative for agitation.  All other systems reviewed and are negative.   Physical Exam Updated Vital Signs BP (!) 140/97 (BP Location: Left Arm)   Pulse (!) 52   Temp 98.2 F (36.8 C) (Oral)   Resp 19   SpO2 100%   Physical Exam Vitals and nursing note reviewed.  Constitutional:      General: She is not in acute  distress.    Appearance: Normal appearance.  HENT:     Head: Normocephalic and atraumatic.     Nose: Nose normal.     Mouth/Throat:     Mouth: Mucous membranes are moist.  Eyes:     Conjunctiva/sclera: Conjunctivae normal.     Pupils: Pupils are equal, round, and reactive to light.  Cardiovascular:     Rate and Rhythm: Normal rate and regular rhythm.     Pulses: Normal pulses.     Heart sounds: Normal heart sounds.  Pulmonary:     Effort: Pulmonary effort is normal.     Breath sounds: Normal breath sounds.  Abdominal:     General: Abdomen is flat. Bowel sounds are normal.     Palpations: Abdomen is soft.     Tenderness: There is no abdominal  tenderness. There is no guarding.  Musculoskeletal:        General: Normal range of motion.     Cervical back: Normal range of motion and neck supple. No tenderness.  Skin:    General: Skin is warm and dry.     Capillary Refill: Capillary refill takes less than 2 seconds.  Neurological:     General: No focal deficit present.     Mental Status: She is alert and oriented to person, place, and time.     Deep Tendon Reflexes: Reflexes normal.  Psychiatric:        Mood and Affect: Mood normal.        Behavior: Behavior normal.     ED Results / Procedures / Treatments   Labs (all labs ordered are listed, but only abnormal results are displayed) Labs Reviewed - No data to display  EKG None  Radiology DG Ribs Unilateral W/Chest Right  Result Date: 11/11/2020 CLINICAL DATA:  Fall, right chest pain EXAM: RIGHT RIBS AND CHEST - 3+ VIEW COMPARISON:  None. FINDINGS: Lungs are clear. No pneumothorax or pleural effusion. Cardiomediastinal silhouette is unremarkable. Pulmonary vascularity is normal. Bilateral mastectomy has been performed with surgical clips seen overlying the upper lung zones bilaterally. Dedicated right rib radiographs demonstrate no evidence of acute fracture or osteolytic lesion. IMPRESSION: No radiographic evidence of acute  cardiopulmonary disease. No acute right rib fracture or osteolytic lesion. Electronically Signed   By: Fidela Salisbury MD   On: 11/11/2020 01:44   CT Head Wo Contrast  Result Date: 11/10/2020 CLINICAL DATA:  Acute pain due to trauma. Pain to the right-side of the head. EXAM: CT HEAD WITHOUT CONTRAST TECHNIQUE: Contiguous axial images were obtained from the base of the skull through the vertex without intravenous contrast. COMPARISON:  02/21/2018 FINDINGS: Brain: No hemorrhage. No extraaxial collection.No midline shift. There is atrophy.The basal cisterns are unremarkable. Patchy and confluent areas of decreased attenuation are noted throughout the deep and periventricular white matter of the cerebral hemispheres bilaterally, compatible with chronic microvascular ischemic disease. The brainstem is unremarkable. The cerebellum is unremarkable. The sella is unremarkable. Vascular: No hyperdense vessel or unexpected calcification. Skull: The calvarium is unremarkable. The skull base is unremarkable. The visualized upper cervical spine is unremarkable. Sinuses/Orbits: The visualized orbits are unremarkable. The paranasal sinuses are unremarkable. The mastoid air cells are clear. Other: The visualized parotid gland is unremarkable. There is no scalp soft tissue swelling. IMPRESSION: 1. No acute intracranial abnormality. 2. Chronic microvascular ischemic disease and atrophy. Electronically Signed   By: Constance Holster M.D.   On: 11/10/2020 23:09   CT Cervical Spine Wo Contrast  Result Date: 11/11/2020 CLINICAL DATA:  Fall EXAM: CT CERVICAL SPINE WITHOUT CONTRAST TECHNIQUE: Multidetector CT imaging of the cervical spine was performed without intravenous contrast. Multiplanar CT image reconstructions were also generated. COMPARISON:  None. FINDINGS: Alignment: Normal. Skull base and vertebrae: No acute fracture. No primary bone lesion or focal pathologic process. Soft tissues and spinal canal: No prevertebral  fluid or swelling. No visible canal hematoma. Disc levels:  Spondylosis without spinal canal stenosis. Upper chest: Negative Other: None IMPRESSION: No acute fracture or static subluxation of the cervical spine. Electronically Signed   By: Ulyses Jarred M.D.   On: 11/11/2020 01:30    Procedures Procedures (including critical care time)  Medications Ordered in ED Medications  lidocaine (LIDODERM) 5 % 2 patch (2 patches Transdermal Patch Applied 11/11/20 0154)    ED Course  I have reviewed the triage  vital signs and the nursing notes.  Pertinent labs & imaging results that were available during my care of the patient were reviewed by me and considered in my medical decision making (see chart for details).    No fractures, no signs of internal trauma.  Well appearing.  Stable for discharge with close follow up.  Ice, NSAIDS and lidoderm.    Wanda Rivera was evaluated in Emergency Department on 11/11/2020 for the symptoms described in the history of present illness. She was evaluated in the context of the global COVID-19 pandemic, which necessitated consideration that the patient might be at risk for infection with the SARS-CoV-2 virus that causes COVID-19. Institutional protocols and algorithms that pertain to the evaluation of patients at risk for COVID-19 are in a state of rapid change based on information released by regulatory bodies including the CDC and federal and state organizations. These policies and algorithms were followed during the patient's care in the ED.  Final Clinical Impression(s) / ED Diagnoses Return for intractable cough, coughing up blood,fevers >100.4 unrelieved by medication, shortness of breath, intractable vomiting, chest pain, shortness of breath, weakness,numbness, changes in speech, facial asymmetry,abdominal pain, passing out,Inability to tolerate liquids or food, cough, altered mental status or any concerns. No signs of systemic illness or infection. The  patient is nontoxic-appearing on exam and vital signs are within normal limits.   I have reviewed the triage vital signs and the nursing notes. Pertinent labs &imaging results that were available during my care of the patient were reviewed by me and considered in my medical decision making (see chart for details).After history, exam, and medical workup I feel the patient has beenappropriately medically screened and is safe for discharge home. Pertinent diagnoses were discussed with the patient. Patient was given return precautions.      Analissa Bayless, MD 11/11/20 0301

## 2020-11-14 ENCOUNTER — Telehealth: Payer: Self-pay | Admitting: Gastroenterology

## 2020-11-14 NOTE — Telephone Encounter (Signed)
Ok, its fine to continue with Miralax

## 2020-11-14 NOTE — Telephone Encounter (Signed)
FYI Dr Silverio Decamp - States she would rather stay with Miralax instead of getting a script of Linzess 145 mcg, Said she took all the Linzess samples but would rather only take the Miralax at this time

## 2020-11-14 NOTE — Telephone Encounter (Addendum)
Pt is requesting a call back from a nurse to discuss the Linzess medication, pt did not disclose further info

## 2020-11-16 ENCOUNTER — Telehealth: Payer: Self-pay | Admitting: Pharmacist

## 2020-11-16 NOTE — Progress Notes (Signed)
Chronic Care Management Pharmacy Assistant   Name: Nyema Hachey  MRN: [id number] DOB: 05-Nov-1941  Reason for Encounter: General Adherence Call  Patient Questions:  1.  Have you seen any other providers since your last visit? Yes, patient last seen Dr. Lynne Leader on 10/17/20 and Dr. Harl Bowie on 11/04/20  2.  Any changes in your medicines or health? Yes, Dr. Silverio Decamp added dicyclomine and the hospitalist added diclofenac sodium and Lidocaine    PCP : Hoyt Koch, MD  Allergies:   Allergies  Allergen Reactions  . Darvon [Propoxyphene]     Darvocet-Makes her "crazy"  . Morphine And Related     Makes her "crazy"  . Percocet [Oxycodone-Acetaminophen]     Makes her "crazy"  . Vicodin [Hydrocodone-Acetaminophen]     Makes her "crazy"    Medications: Outpatient Encounter Medications as of 11/16/2020  Medication Sig  . acetaminophen (TYLENOL) 500 MG tablet Take 1,000 mg by mouth every 6 (six) hours as needed for moderate pain or headache.   Marland Kitchen BIOTIN PO Take 1,000 mcg by mouth daily.   . brimonidine (ALPHAGAN) 0.2 % ophthalmic solution Place 1 drop into both eyes in the morning and at bedtime.  . Calcium Carb-Cholecalciferol (CALCIUM + D3) 600-200 MG-UNIT TABS Take 1 tablet by mouth 2 (two) times daily.   . Cholecalciferol (VITAMIN D3) 400 UNITS CAPS Take 400 Units by mouth daily.   . Cyanocobalamin (VITAMIN B 12 PO) Take 500 mg by mouth daily.  . diclofenac Sodium (VOLTAREN) 1 % GEL Apply 4 g topically 4 (four) times daily.  Marland Kitchen dicyclomine (BENTYL) 10 MG capsule Take 1 capsule (10 mg total) by mouth 2 (two) times daily as needed for spasms.  Marland Kitchen DIGESTIVE ENZYMES PO Take 1 capsule by mouth daily.   . dorzolamide-timolol (COSOPT) 22.3-6.8 MG/ML ophthalmic solution Place 1 drop into both eyes 2 (two) times daily.   . DULoxetine (CYMBALTA) 20 MG capsule Take 1 capsule (20 mg total) by mouth daily.  Marland Kitchen latanoprost (XALATAN) 0.005 % ophthalmic solution Place 1 drop into  both eyes at bedtime.   . lidocaine (LIDODERM) 5 % Place 1 patch onto the skin daily. Remove & Discard patch within 12 hours or as directed by MD  . meclizine (ANTIVERT) 12.5 MG tablet Take 12.5 mg by mouth 3 (three) times daily as needed for dizziness.   . Multiple Vitamin (ONE-A-DAY ESSENTIAL PO) Take by mouth daily.  . Omega-3 1000 MG CAPS Take 1 capsule by mouth daily.   Marland Kitchen omeprazole (PRILOSEC) 40 MG capsule Take 1 capsule (40 mg total) by mouth 2 (two) times daily.  . polyethylene glycol powder (GLYCOLAX/MIRALAX) powder Take 17 g by mouth 2 (two) times daily as needed. (Patient taking differently: Take 17 g by mouth 2 (two) times daily as needed for mild constipation. )  . rosuvastatin (CRESTOR) 10 MG tablet Take 1 tablet (10 mg total) by mouth daily.  . Turmeric 500 MG TABS Take 1 tablet by mouth daily.    No facility-administered encounter medications on file as of 11/16/2020.    Current Diagnosis: Patient Active Problem List   Diagnosis Date Noted  . RUQ pain 02/23/2020  . Dysphagia 08/19/2019  . Primary osteoarthritis of right knee 12/26/2018  . Cough 12/19/2018  . Constipation 12/19/2018  . GERD (gastroesophageal reflux disease) 12/13/2017  . History of breast cancer 01/02/2017  . Compression fracture of L3 lumbar vertebra 05/04/2016  . Hyperlipidemia 08/16/2015  . Left carotid bruit 08/16/2015  . Atherosclerotic  peripheral vascular disease ([place]) 08/06/2015  . Routine general medical examination at a health care facility 12/09/2014  . Osteopenia 12/09/2014  . Glaucoma 12/09/2014    Goals Addressed   None     Follow-Up:  Pharmacist Review   Spoke with patient and told her that I was calling to follow up with her since her visit with the clinical pharmacist Mendel Ryder. I ask the patient was she having any problems with her health. The patient stated that last Thursday she had a fall in her house in the kitchen and that her son took her out to the Emergency room where she  waited for hours to be seen. The patient stated that she does not have any issues with her medication only that she cannot do pain medications. She does not have any problems with getting her medications from the pharmacy which she gets mail order. The patient states that she is watching what she eats, she tries to say away drom fried foods and eats plenty of vegetables. The one concern she mention was that she was having some constipation and was given some sample of linezess. I told the patient that I would pass along this information to [place].    Wendy Poet, Clinical Pharmacist Assistant [facility]

## 2020-11-25 ENCOUNTER — Other Ambulatory Visit: Payer: Self-pay

## 2020-11-25 ENCOUNTER — Ambulatory Visit (HOSPITAL_COMMUNITY)
Admission: RE | Admit: 2020-11-25 | Discharge: 2020-11-25 | Disposition: A | Discharge status: Home / Self Care | Payer: Medicare Other | Source: Ambulatory Visit | Attending: Gastroenterology | Admitting: Gastroenterology

## 2020-11-25 DIAGNOSIS — K219 Gastro-esophageal reflux disease without esophagitis: Secondary | ICD-10-CM | POA: Insufficient documentation

## 2020-11-25 DIAGNOSIS — K581 Irritable bowel syndrome with constipation: Secondary | ICD-10-CM | POA: Insufficient documentation

## 2020-11-25 DIAGNOSIS — R112 Nausea with vomiting, unspecified: Secondary | ICD-10-CM | POA: Diagnosis not present

## 2020-11-25 DIAGNOSIS — K824 Cholesterolosis of gallbladder: Secondary | ICD-10-CM | POA: Insufficient documentation

## 2020-11-25 MED ORDER — TECHNETIUM TC 99M MEBROFENIN IV KIT
5.5000 | PACK | Freq: Once | INTRAVENOUS | Status: AC | PRN
  Administered 2020-11-25: 5.5 via INTRAVENOUS

## 2020-11-28 ENCOUNTER — Other Ambulatory Visit: Payer: Self-pay | Admitting: Gastroenterology

## 2020-12-21 ENCOUNTER — Other Ambulatory Visit: Payer: Self-pay

## 2020-12-21 ENCOUNTER — Ambulatory Visit (INDEPENDENT_AMBULATORY_CARE_PROVIDER_SITE_OTHER): Payer: Medicare Other | Admitting: Internal Medicine

## 2020-12-21 ENCOUNTER — Encounter: Payer: Self-pay | Admitting: Internal Medicine

## 2020-12-21 VITALS — BP 114/80 | HR 62 | Temp 98.2°F | Ht 64.0 in | Wt 138.1 lb

## 2020-12-21 DIAGNOSIS — R1011 Right upper quadrant pain: Secondary | ICD-10-CM | POA: Diagnosis not present

## 2020-12-21 DIAGNOSIS — K219 Gastro-esophageal reflux disease without esophagitis: Secondary | ICD-10-CM | POA: Diagnosis not present

## 2020-12-21 DIAGNOSIS — I70209 Unspecified atherosclerosis of native arteries of extremities, unspecified extremity: Secondary | ICD-10-CM

## 2020-12-21 DIAGNOSIS — E782 Mixed hyperlipidemia: Secondary | ICD-10-CM | POA: Diagnosis not present

## 2020-12-21 DIAGNOSIS — Z1159 Encounter for screening for other viral diseases: Secondary | ICD-10-CM

## 2020-12-21 DIAGNOSIS — R7301 Impaired fasting glucose: Secondary | ICD-10-CM

## 2020-12-21 LAB — CBC
HCT: 41.1 % (ref 36.0–46.0)
Hemoglobin: 13.5 g/dL (ref 12.0–15.0)
MCHC: 32.9 g/dL (ref 30.0–36.0)
MCV: 94.2 fl (ref 78.0–100.0)
Platelets: 176 10*3/uL (ref 150.0–400.0)
RBC: 4.37 Mil/uL (ref 3.87–5.11)
RDW: 13.8 % (ref 11.5–15.5)
WBC: 6.1 10*3/uL (ref 4.0–10.5)

## 2020-12-21 LAB — COMPREHENSIVE METABOLIC PANEL
ALT: 14 U/L (ref 0–35)
AST: 21 U/L (ref 0–37)
Albumin: 4.1 g/dL (ref 3.5–5.2)
Alkaline Phosphatase: 109 U/L (ref 39–117)
BUN: 13 mg/dL (ref 6–23)
CO2: 31 mEq/L (ref 19–32)
Calcium: 9.2 mg/dL (ref 8.4–10.5)
Chloride: 104 mEq/L (ref 96–112)
Creatinine, Ser: 1.08 mg/dL (ref 0.40–1.20)
GFR: 49.28 mL/min — ABNORMAL LOW (ref >60.00)
Glucose, Bld: 68 mg/dL — ABNORMAL LOW (ref 70–99)
Potassium: 3.8 mEq/L (ref 3.5–5.1)
Sodium: 139 mEq/L (ref 135–145)
Total Bilirubin: 0.5 mg/dL (ref 0.2–1.2)
Total Protein: 6.8 g/dL (ref 6.0–8.3)

## 2020-12-21 LAB — LIPID PANEL
Cholesterol: 138 mg/dL (ref 0–200)
HDL: 45.7 mg/dL (ref >39.00)
LDL Cholesterol: 74 mg/dL (ref 0–99)
NonHDL: 92.67
Total CHOL/HDL Ratio: 3
Triglycerides: 91 mg/dL (ref 0.0–149.0)
VLDL: 18.2 mg/dL (ref 0.0–40.0)

## 2020-12-21 LAB — HEMOGLOBIN A1C: Hgb A1c MFr Bld: 5.6 % (ref 4.6–6.5)

## 2020-12-21 NOTE — Progress Notes (Signed)
   Subjective:   Patient ID: Wanda Rivera, female    DOB: [birth year], 79 y.o.   MRN: [id number]  HPI Here for follow up chronic including cholesterol (taking crestor 10 mg daily, denies side effects, denies chest pains or stroke symptoms), and GERD (taking prilosec 40 mg daily, having more stomach problems recently and saw GI and advised she may have IBS, now taking bentyl 10 mg BID, feels that this is not helping, she feels tylenol helps more), and atherosclerosis (taking crestor daily, denies chest pains or stroke symptoms, denies leg pain with walking, non-smoker) and recent fall (injury to right ribs, she is finally starting to improve, used heat and tylenol for the severe pain)  Review of Systems  Constitutional: Negative.   HENT: Negative.   Eyes: Negative.   Respiratory: Negative for cough, chest tightness and shortness of breath.   Cardiovascular: Negative for chest pain, palpitations and leg swelling.  Gastrointestinal: Positive for abdominal pain. Negative for abdominal distention, constipation, diarrhea, nausea and vomiting.  Musculoskeletal: Negative.   Skin: Negative.   Neurological: Negative.   Psychiatric/Behavioral: Negative.     Objective:  Physical Exam Constitutional:      Appearance: She is well-developed and well-nourished.  HENT:     Head: Normocephalic and atraumatic.  Eyes:     Extraocular Movements: EOM normal.  Cardiovascular:     Rate and Rhythm: Normal rate and regular rhythm.  Pulmonary:     Effort: Pulmonary effort is normal. No respiratory distress.     Breath sounds: Normal breath sounds. No wheezing or rales.  Abdominal:     General: Bowel sounds are normal. There is no distension.     Palpations: Abdomen is soft.     Tenderness: There is no abdominal tenderness. There is no rebound.  Musculoskeletal:        General: No edema.     Cervical back: Normal range of motion.  Skin:    General: Skin is warm and dry.  Neurological:     Mental  Status: She is alert and oriented to person, place, and time.     Coordination: Coordination normal.  Psychiatric:        Mood and Affect: Mood and affect normal.     Vitals:   12/21/20 0952  BP: 114/80  Pulse: 62  Temp: 98.2 F (36.8 C)  TempSrc: Oral  SpO2: 94%  Weight: 138 lb 1.6 oz (62.6 kg)  Height: 5\' 4"  (1.626 m)    This visit occurred during the SARS-CoV-2 public health emergency.  Safety protocols were in place, including screening questions prior to the visit, additional usage of staff PPE, and extensive cleaning of exam room while observing appropriate contact time as indicated for disinfecting solutions.   Assessment & Plan:

## 2020-12-21 NOTE — Assessment & Plan Note (Signed)
Checking lipid panel and adjust as needed crestor 10 mg daily.  

## 2020-12-21 NOTE — Assessment & Plan Note (Signed)
Taking crestor 10 mg daily and rechecking lipid panel today and adjust as needed for LDL <70.

## 2020-12-21 NOTE — Assessment & Plan Note (Signed)
Does okay with omeprazole BID and gets significant symptoms with missing doses.

## 2020-12-21 NOTE — Patient Instructions (Signed)
We will check the blood work today and call you back about the results.   I would recommend to use the tylenol for the stomach since that works better.

## 2020-12-21 NOTE — Assessment & Plan Note (Signed)
We discussed gallbladder polyp is not significant and she is concerned given her history of breast cancer. Reassurance given.

## 2020-12-22 LAB — HEPATITIS C ANTIBODY
Hepatitis C Ab: NONREACTIVE
SIGNAL TO CUT-OFF: 0.01 (ref <1.00)

## 2020-12-28 ENCOUNTER — Telehealth: Payer: Self-pay | Admitting: Internal Medicine

## 2020-12-28 NOTE — Telephone Encounter (Signed)
Patient calling to find out blood work results  Please call the patient at [phone]

## 2020-12-29 ENCOUNTER — Telehealth: Payer: Self-pay | Admitting: Pharmacist

## 2020-12-29 NOTE — Progress Notes (Signed)
    Chronic Care Management Pharmacy Assistant   Name: Wanda Rivera  MRN: [id number] DOB: August 02, 1941  Reason for Encounter: Chart Review    PCP : Myrlene Broker, MD  Allergies:   Allergies  Allergen Reactions  . Darvon [Propoxyphene]     Darvocet-Makes her "crazy"  . Morphine And Related     Makes her "crazy"  . Percocet [Oxycodone-Acetaminophen]     Makes her "crazy"  . Vicodin [Hydrocodone-Acetaminophen]     Makes her "crazy"    Medications: Outpatient Encounter Medications as of 12/29/2020  Medication Sig  . acetaminophen (TYLENOL) 500 MG tablet Take 1,000 mg by mouth every 6 (six) hours as needed for moderate pain or headache.   Marland Kitchen BIOTIN PO Take 1,000 mcg by mouth daily.   . brimonidine (ALPHAGAN) 0.2 % ophthalmic solution Place 1 drop into both eyes in the morning and at bedtime.  . Calcium Carb-Cholecalciferol (CALCIUM + D3) 600-200 MG-UNIT TABS Take 1 tablet by mouth 2 (two) times daily.   . Cholecalciferol (VITAMIN D3) 400 UNITS CAPS Take 400 Units by mouth daily.   . Cyanocobalamin (VITAMIN B 12 PO) Take 500 mg by mouth daily.  . diclofenac Sodium (VOLTAREN) 1 % GEL Apply 4 g topically 4 (four) times daily.  Marland Kitchen dicyclomine (BENTYL) 10 MG capsule Take 1 capsule (10 mg total) by mouth 2 (two) times daily as needed for spasms.  . dorzolamide-timolol (COSOPT) 22.3-6.8 MG/ML ophthalmic solution Place 1 drop into both eyes 2 (two) times daily.   . DULoxetine (CYMBALTA) 20 MG capsule Take 1 capsule (20 mg total) by mouth daily.  Marland Kitchen latanoprost (XALATAN) 0.005 % ophthalmic solution Place 1 drop into both eyes at bedtime.   . lidocaine (LIDODERM) 5 % Place 1 patch onto the skin daily. Remove & Discard patch within 12 hours or as directed by MD  . meclizine (ANTIVERT) 12.5 MG tablet Take 12.5 mg by mouth 3 (three) times daily as needed for dizziness.   . Multiple Vitamin (ONE-A-DAY ESSENTIAL PO) Take by mouth daily.  . Omega-3 1000 MG CAPS Take 1 capsule by mouth daily.    Marland Kitchen omeprazole (PRILOSEC) 40 MG capsule TAKE 1 CAPSULE TWICE DAILY (DOSE CHANGE)  . polyethylene glycol powder (GLYCOLAX/MIRALAX) powder Take 17 g by mouth 2 (two) times daily as needed. (Patient taking differently: Take 17 g by mouth 2 (two) times daily as needed for mild constipation.)  . rosuvastatin (CRESTOR) 10 MG tablet Take 1 tablet (10 mg total) by mouth daily.  . Turmeric 500 MG TABS Take 1 tablet by mouth daily.    No facility-administered encounter medications on file as of 12/29/2020.    Current Diagnosis: Patient Active Problem List   Diagnosis Date Noted  . RUQ pain 02/23/2020  . Dysphagia 08/19/2019  . Primary osteoarthritis of right knee 12/26/2018  . Cough 12/19/2018  . Constipation 12/19/2018  . Rotator cuff arthropathy of right shoulder 04/02/2018  . GERD (gastroesophageal reflux disease) 12/13/2017  . History of breast cancer 01/02/2017  . Compression fracture of L3 lumbar vertebra 05/04/2016  . Hyperlipidemia 08/16/2015  . Left carotid bruit 08/16/2015  . Atherosclerotic peripheral vascular disease ([facility]) 08/06/2015  . Routine general medical examination at a health care facility 12/09/2014  . Osteopenia 12/09/2014  . Glaucoma 12/09/2014    Goals Addressed   None     Follow-Up:  Pharmacist Review

## 2020-12-29 NOTE — Telephone Encounter (Signed)
Pt informed of normal lab results. See labs.

## 2020-12-29 NOTE — Telephone Encounter (Signed)
Sent you result note

## 2021-01-02 ENCOUNTER — Ambulatory Visit (INDEPENDENT_AMBULATORY_CARE_PROVIDER_SITE_OTHER): Payer: Medicare Other

## 2021-01-02 DIAGNOSIS — Z0000 Encounter for general adult medical examination without abnormal findings: Secondary | ICD-10-CM | POA: Diagnosis not present

## 2021-01-02 NOTE — Patient Instructions (Signed)
Wanda Rivera , Thank you for taking time to come for your Medicare Wellness Visit. I appreciate your ongoing commitment to your health goals. Please review the following plan we discussed and let me know if I can assist you in the future.   Screening recommendations/referrals: Colonoscopy: 03/13/2016; due every 10 years Mammogram: 06/15/2015 Bone Density: 12/18/2018; due every 2 years Recommended yearly ophthalmology/optometry visit for glaucoma screening and checkup Recommended yearly dental visit for hygiene and checkup  Vaccinations: Influenza vaccine: 09/14/2020 Pneumococcal vaccine: up to date Tdap vaccine: 12/09/2015; due every 10 years Shingles vaccine: never done Covid-19: up to date  Advanced directives: Please bring a copy of your health care power of attorney and living will to the office at your convenience.  Conditions/risks identified: Yes; Reviewed health maintenance screenings with patient today and relevant education, vaccines, and/or referrals were provided. Please continue to do your personal lifestyle choices by: daily care of teeth and gums, regular physical activity (goal should be 5 days a week for 30 minutes), eat a healthy diet, avoid tobacco and drug use, limiting any alcohol intake, taking a low-dose aspirin (if not allergic or have been advised by your provider otherwise) and taking vitamins and minerals as recommended by your provider. Continue doing brain stimulating activities (puzzles, reading, adult coloring books, staying active) to keep memory sharp. Continue to eat heart healthy diet (full of fruits, vegetables, whole grains, lean protein, water--limit salt, fat, and sugar intake) and increase physical activity as tolerated.  Next appointment: Please schedule your next Medicare Wellness Visit with your Nurse Health Advisor in 1 year by calling [phone].  Preventive Care 26 Years and Older, Female Preventive care refers to lifestyle choices and visits with  your health care provider that can promote health and wellness. What does preventive care include?  A yearly physical exam. This is also called an annual well check.  Dental exams once or twice a year.  Routine eye exams. Ask your health care provider how often you should have your eyes checked.  Personal lifestyle choices, including:  Daily care of your teeth and gums.  Regular physical activity.  Eating a healthy diet.  Avoiding tobacco and drug use.  Limiting alcohol use.  Practicing safe sex.  Taking low-dose aspirin every day.  Taking vitamin and mineral supplements as recommended by your health care provider. What happens during an annual well check? The services and screenings done by your health care provider during your annual well check will depend on your age, overall health, lifestyle risk factors, and family history of disease. Counseling  Your health care provider may ask you questions about your:  Alcohol use.  Tobacco use.  Drug use.  Emotional well-being.  Home and relationship well-being.  Sexual activity.  Eating habits.  History of falls.  Memory and ability to understand (cognition).  Work and work Astronomer.  Reproductive health. Screening  You may have the following tests or measurements:  Height, weight, and BMI.  Blood pressure.  Lipid and cholesterol levels. These may be checked every 5 years, or more frequently if you are over 4 years old.  Skin check.  Lung cancer screening. You may have this screening every year starting at age 67 if you have a 30-pack-year history of smoking and currently smoke or have quit within the past 15 years.  Fecal occult blood test (FOBT) of the stool. You may have this test every year starting at age 71.  Flexible sigmoidoscopy or colonoscopy. You may have a sigmoidoscopy every  5 years or a colonoscopy every 10 years starting at age 11.  Hepatitis C blood test.  Hepatitis B blood  test.  Sexually transmitted disease (STD) testing.  Diabetes screening. This is done by checking your blood sugar (glucose) after you have not eaten for a while (fasting). You may have this done every 1-3 years.  Bone density scan. This is done to screen for osteoporosis. You may have this done starting at age 78.  Mammogram. This may be done every 1-2 years. Talk to your health care provider about how often you should have regular mammograms. Talk with your health care provider about your test results, treatment options, and if necessary, the need for more tests. Vaccines  Your health care provider may recommend certain vaccines, such as:  Influenza vaccine. This is recommended every year.  Tetanus, diphtheria, and acellular pertussis (Tdap, Td) vaccine. You may need a Td booster every 10 years.  Zoster vaccine. You may need this after age 66.  Pneumococcal 13-valent conjugate (PCV13) vaccine. One dose is recommended after age 62.  Pneumococcal polysaccharide (PPSV23) vaccine. One dose is recommended after age 41. Talk to your health care provider about which screenings and vaccines you need and how often you need them. This information is not intended to replace advice given to you by your health care provider. Make sure you discuss any questions you have with your health care provider. Document Released: 01/13/2016 Document Revised: 09/05/2016 Document Reviewed: 10/18/2015 Elsevier Interactive Patient Education  2017 [place] Prevention in the Home Falls can cause injuries. They can happen to people of all ages. There are many things you can do to make your home safe and to help prevent falls. What can I do on the outside of my home?  Regularly fix the edges of walkways and driveways and fix any cracks.  Remove anything that might make you trip as you walk through a door, such as a raised step or threshold.  Trim any bushes or trees on the path to your home.  Use  bright outdoor lighting.  Clear any walking paths of anything that might make someone trip, such as rocks or tools.  Regularly check to see if handrails are loose or broken. Make sure that both sides of any steps have handrails.  Any raised decks and porches should have guardrails on the edges.  Have any leaves, snow, or ice cleared regularly.  Use sand or salt on walking paths during winter.  Clean up any spills in your garage right away. This includes oil or grease spills. What can I do in the bathroom?  Use night lights.  Install grab bars by the toilet and in the tub and shower. Do not use towel bars as grab bars.  Use non-skid mats or decals in the tub or shower.  If you need to sit down in the shower, use a plastic, non-slip stool.  Keep the floor dry. Clean up any water that spills on the floor as soon as it happens.  Remove soap buildup in the tub or shower regularly.  Attach bath mats securely with double-sided non-slip rug tape.  Do not have throw rugs and other things on the floor that can make you trip. What can I do in the bedroom?  Use night lights.  Make sure that you have a light by your bed that is easy to reach.  Do not use any sheets or blankets that are too big for your bed. They should not  hang down onto the floor.  Have a firm chair that has side arms. You can use this for support while you get dressed.  Do not have throw rugs and other things on the floor that can make you trip. What can I do in the kitchen?  Clean up any spills right away.  Avoid walking on wet floors.  Keep items that you use a lot in easy-to-reach places.  If you need to reach something above you, use a strong step stool that has a grab bar.  Keep electrical cords out of the way.  Do not use floor polish or wax that makes floors slippery. If you must use wax, use non-skid floor wax.  Do not have throw rugs and other things on the floor that can make you trip. What can  I do with my stairs?  Do not leave any items on the stairs.  Make sure that there are handrails on both sides of the stairs and use them. Fix handrails that are broken or loose. Make sure that handrails are as long as the stairways.  Check any carpeting to make sure that it is firmly attached to the stairs. Fix any carpet that is loose or worn.  Avoid having throw rugs at the top or bottom of the stairs. If you do have throw rugs, attach them to the floor with carpet tape.  Make sure that you have a light switch at the top of the stairs and the bottom of the stairs. If you do not have them, ask someone to add them for you. What else can I do to help prevent falls?  Wear shoes that:  Do not have high heels.  Have rubber bottoms.  Are comfortable and fit you well.  Are closed at the toe. Do not wear sandals.  If you use a stepladder:  Make sure that it is fully opened. Do not climb a closed stepladder.  Make sure that both sides of the stepladder are locked into place.  Ask someone to hold it for you, if possible.  Clearly mark and make sure that you can see:  Any grab bars or handrails.  First and last steps.  Where the edge of each step is.  Use tools that help you move around (mobility aids) if they are needed. These include:  Canes.  Walkers.  Scooters.  Crutches.  Turn on the lights when you go into a dark area. Replace any light bulbs as soon as they burn out.  Set up your furniture so you have a clear path. Avoid moving your furniture around.  If any of your floors are uneven, fix them.  If there are any pets around you, be aware of where they are.  Review your medicines with your doctor. Some medicines can make you feel dizzy. This can increase your chance of falling. Ask your doctor what other things that you can do to help prevent falls. This information is not intended to replace advice given to you by your health care provider. Make sure you  discuss any questions you have with your health care provider. Document Released: 10/13/2009 Document Revised: 05/24/2016 Document Reviewed: 01/21/2015 Elsevier Interactive Patient Education  2017 [employer].

## 2021-01-02 NOTE — Progress Notes (Addendum)
I connected with Wanda Rivera today by telephone and verified that I am speaking with the correct person using two identifiers. Location patient: home Location provider: work Persons participating in the virtual visit: Nitika Jackowski and Susie Cassette, LPN.   I discussed the limitations, risks, security and privacy concerns of performing an evaluation and management service by telephone and the availability of in person appointments. I also discussed with the patient that there may be a patient responsible charge related to this service. The patient expressed understanding and verbally consented to this telephonic visit.    Interactive audio and video telecommunications were attempted between this provider and patient, however failed, due to patient having technical difficulties OR patient did not have access to video capability.  We continued and completed visit with audio only.  Some vital signs may be absent or patient reported.   Time Spent with patient on telephone encounter: 40 minutes  Subjective:   Wanda Rivera is a 80 y.o. female who presents for Medicare Annual (Subsequent) preventive examination.  Review of Systems    No ROS. Medicare Wellness Visit. Additional risk factors are reflected in social history. Cardiac Risk Factors include: advanced age (>85men, >65 women);dyslipidemia;family history of premature cardiovascular disease     Objective:    There were no vitals filed for this visit. There is no height or weight on file to calculate BMI.  Advanced Directives 01/02/2021 11/10/2020 05/04/2020 12/17/2018 02/21/2018 03/20/2016 03/13/2016  Does Patient Have a Medical Advance Directive? Yes No No Yes No No No  Type of Advance Directive Living will;Healthcare Power of [employer] - - Healthcare Power of [place];Living will - - -  Does patient want to make changes to medical advance directive? No - Patient declined - - - - - -  Copy of Healthcare Power of Attorney in Chart?  No - copy requested - - No - copy requested - - -  Would patient like information on creating a medical advance directive? - No - Patient declined No - Patient declined - No - Patient declined - -    Current Medications (verified) Outpatient Encounter Medications as of 01/02/2021  Medication Sig  . acetaminophen (TYLENOL) 500 MG tablet Take 1,000 mg by mouth every 6 (six) hours as needed for moderate pain or headache.   Marland Kitchen BIOTIN PO Take 1,000 mcg by mouth daily.   . brimonidine (ALPHAGAN) 0.2 % ophthalmic solution Place 1 drop into both eyes in the morning and at bedtime.  . Calcium Carb-Cholecalciferol (CALCIUM + D3) 600-200 MG-UNIT TABS Take 1 tablet by mouth 2 (two) times daily.   . Cholecalciferol (VITAMIN D3) 400 UNITS CAPS Take 400 Units by mouth daily.   . Cyanocobalamin (VITAMIN B 12 PO) Take 500 mg by mouth daily.  . diclofenac Sodium (VOLTAREN) 1 % GEL Apply 4 g topically 4 (four) times daily.  Marland Kitchen dicyclomine (BENTYL) 10 MG capsule Take 1 capsule (10 mg total) by mouth 2 (two) times daily as needed for spasms.  . dorzolamide-timolol (COSOPT) 22.3-6.8 MG/ML ophthalmic solution Place 1 drop into both eyes 2 (two) times daily.   . DULoxetine (CYMBALTA) 20 MG capsule Take 1 capsule (20 mg total) by mouth daily.  Marland Kitchen latanoprost (XALATAN) 0.005 % ophthalmic solution Place 1 drop into both eyes at bedtime.   . lidocaine (LIDODERM) 5 % Place 1 patch onto the skin daily. Remove & Discard patch within 12 hours or as directed by MD  . meclizine (ANTIVERT) 12.5 MG tablet Take 12.5 mg by mouth  3 (three) times daily as needed for dizziness.   . Multiple Vitamin (ONE-A-DAY ESSENTIAL PO) Take by mouth daily.  . Omega-3 1000 MG CAPS Take 1 capsule by mouth daily.   Marland Kitchen omeprazole (PRILOSEC) 40 MG capsule TAKE 1 CAPSULE TWICE DAILY (DOSE CHANGE)  . polyethylene glycol powder (GLYCOLAX/MIRALAX) powder Take 17 g by mouth 2 (two) times daily as needed. (Patient taking differently: Take 17 g by mouth 2 (two)  times daily as needed for mild constipation.)  . rosuvastatin (CRESTOR) 10 MG tablet Take 1 tablet (10 mg total) by mouth daily.  . Turmeric 500 MG TABS Take 1 tablet by mouth daily.    No facility-administered encounter medications on file as of 01/02/2021.    Allergies (verified) Darvon [propoxyphene], Morphine and related, Percocet [oxycodone-acetaminophen], and Vicodin [hydrocodone-acetaminophen]   History: Past Medical History:  Diagnosis Date  . 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 Breast cancer (HCC)    1982, recurrence in 1985  . Cataract    Bil  . Glaucoma    Bil  . Osteoporosis    Past Surgical History:  Procedure Laterality Date  . ABDOMINAL HYSTERECTOMY    . BREAST SURGERY     double masectomy for breast cancer  . eye surgery Bilateral 06/05/2017   for Glaucoma   Family History  Problem Relation Age of Onset  . Cancer Mother   . Breast cancer Mother   . Liver cancer Mother   . Diabetes Father   . Hypertension Father   . Hypertension Sister   . Diabetes Brother   . Heart disease Brother    Social History   Socioeconomic History  . Marital status: Divorced    Spouse name: Not on file  . Number of children: 1  . Years of education: Not on file  . Highest education level: Not on file  Occupational History  . Occupation: retired  Tobacco Use  . Smoking status: Never Smoker  . Smokeless tobacco: Never Used  Vaping Use  . Vaping Use: Never used  Substance and Sexual Activity  . Alcohol use: Yes    Alcohol/week: 0.0 standard drinks    Comment: occasionally a glass of red wine  . Drug use: No  . Sexual activity: Never  Other Topics Concern  . Not on file  Social History Narrative  . Not on file   Social Determinants of Health   Financial Resource Strain: Low Risk   . Difficulty of Paying Living Expenses: Not very hard  Food Insecurity: No Food Insecurity  . Worried About Programme researcher, broadcasting/film/video in the Last Year: Never true  . Ran Out of Food in the  Last Year: Never true  Transportation Needs: No Transportation Needs  . Lack of Transportation (Medical): No  . Lack of Transportation (Non-Medical): No  Physical Activity: Sufficiently Active  . Days of Exercise per Week: 5 days  . Minutes of Exercise per Session: 30 min  Stress: No Stress Concern Present  . Feeling of Stress : Not at all  Social Connections: Moderately Integrated  . Frequency of Communication with Friends and Family: More than three times a week  . Frequency of Social Gatherings with Friends and Family: Once a week  . Attends Religious Services: 1 to 4 times per year  . Active Member of Clubs or Organizations: No  . Attends Banker Meetings: 1 to 4 times per year  . 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 no  Interpreter Needed?: No  Information entered by :: Mickeal Needy, LPN   Activities of Daily Living In your present state of health, do you have any difficulty performing the following activities: 01/02/2021  Hearing? N  Vision? N  Difficulty concentrating or making decisions? N  Walking or climbing stairs? N  Dressing or bathing? N  Doing errands, shopping? N  Preparing Food and eating ? N  Using the Toilet? N  In the past six months, have you accidently leaked urine? N  Do you have problems with loss of bowel control? N  Managing your Medications? N  Managing your Finances? N  Housekeeping or managing your Housekeeping? N  Some recent data might be hidden    Patient Care Team: Myrlene Broker, MD as PCP -  General (Internal Medicine) Yates Decamp, MD as Consulting Physician (Cardiology) Kathyrn Sheriff, [facility] as Pharmacist (Pharmacist)  Indicate any recent Medical Services you may have received from other than [facility] providers in the past year (date may be approximate).     Assessment:   This is a routine wellness examination for Wanda Rivera.  Hearing/Vision screen No exam data present  Dietary issues and exercise activities discussed: Current Exercise Habits: Home exercise routine, Type of exercise: walking (stair climbing, elliptical), Time (Minutes): 30, Frequency (Times/Week): 5, Weekly Exercise (Minutes/Week): 150, Intensity: Mild, Exercise limited by: orthopedic condition(s)  Goals    . Patient Stated     Increase my physical activity by getting back on my routine of walking and exercising on my eliptical.    . Pharmacy Care Plan     CARE PLAN ENTRY (see longitudinal plan of care for additional care plan information)  Current Barriers:  . Chronic Disease Management support, education, and care coordination needs related to Hyperlipidemia, Osteoporosis, and Osteoarthritis, Insomnia  Hyperlipidemia Lab Results  Component Value Date/Time   LDLCALC 141 (H) 12/21/2019 09:13 AM   LDLCALC 132 (H) 08/12/2015 08:00 AM .  Pharmacist Clinical Goal(s): o Over the next 150 days, patient will work with PharmD and providers to achieve LDL goal < 70 . Current regimen:  o Rosuvastatin 10 mg daily . Interventions: o Discussed cholesterol goals and benefits of medications for prevention of heart attack / stroke . Patient self care activities - Over the next 150 days, patient will: o Continue medication as prescribed  Osteoarthritis . Pharmacist Clinical Goal(s) o Over the next 150 days, patient will work with PharmD and providers to optimize therapy . Current regimen:  o Tylenol PM as needed . Interventions: o Discussed "PM" portion is Benadryl which causes sleepiness. Plain Tylenol will not  cause sleepiness. o Recommend Tylenol 500 mg as needed for arthritis pain o Discussed benefits of Voltaren gel for arthritic joints . Patient self care activities - Over the next 150 days, patient will: o Use regular Tylenol Extra Strength (not PM) 1-2 tablets up to 3 times daily as needed o Recommend Voltaren gel up to 4 times daily as needed  Osteoporosis . Pharmacist Clinical Goal(s) o Over the next 150 days, patient will work with PharmD and providers to optimize therapy .  Current regimen:  o Calcium-Vitamin D o Vitamin D . Interventions: o Recommend 1000-2000 IU of Vitamin D per day o Recommend 1200 mg of calcium from dietary and supplemental sources o Discussed repeat DEXA is due December 2021 to assess bone healthy o Discussed Prolia injection every 6 months may be beneficial if osteoporosis is still present on repeat DEXA . Patient self care activities - Over the next 150 days, patient will: o Follow up with PCP as scheduled  Insomnia . Pharmacist Clinical Goal(s) o Over the next 150 days, patient will work with PharmD and providers to optimize therapy . Current regimen:  o No medications . Interventions: o Discussed sleep hygiene - avoiding screens within 30 minutes of bedtime o Discussed benefits of melatonin for sleep . Patient self care activities - Over the next 150 days, patient will: o Try melatonin 1 mg HS. May increase up to 2 tablets if no benefit. Maximum dose 10 mg  Medication management . Pharmacist Clinical Goal(s): o Over the next 150 days, patient will work with PharmD and providers to maintain optimal medication adherence . Current pharmacy: [facility] . Interventions o Comprehensive medication review performed. o Continue current medication management strategy . Patient self care activities - Over the next 150 days, patient will: o Focus on medication adherence by fill date o Take medications as prescribed o Report any questions or concerns to PharmD  and/or provider(s)  Initial goal documentation      Depression Screen PHQ 2/9 Scores 01/02/2021 12/21/2020 12/21/2019 12/17/2018 02/20/2018 02/07/2017 01/10/2017  PHQ - 2 Score 0 1 0 0 0 0 0  PHQ- 9 Score - 6 - - - - -    Fall Risk Fall Risk  01/02/2021 12/21/2020 11/25/2019 12/17/2018 02/20/2018  Falls in the past year? 1 1 0 0 No  Comment - - Emmi Telephone Survey: data to providers prior to load - -  Number falls in past yr: 0 0 - - -  Injury with Fall? 1 1 - - -  Risk for fall due to : History of fall(s) History of fall(s) - - -  Follow up Falls evaluation completed Falls evaluation completed - - -    FALL RISK PREVENTION PERTAINING TO THE HOME:  Any stairs in or around the home? Yes  If so, are there any without handrails? No  Home free of loose throw rugs in walkways, pet beds, electrical cords, etc? Yes  Adequate lighting in your home to reduce risk of falls? Yes   ASSISTIVE DEVICES UTILIZED TO PREVENT FALLS:  Life alert? No  Use of a cane, walker or w/c? No  Grab bars in the bathroom? No  Shower chair or bench in shower? No  Elevated toilet seat or a handicapped toilet? No   TIMED UP AND GO:  Was the test performed? No .  Length of time to ambulate 10 feet: 0 sec.   Gait steady and fast without use of assistive device  Cognitive Function: MMSE - Mini Mental State Exam 12/17/2018  Orientation to time 5  Orientation to Place 5  Registration 3  Attention/ Calculation 4  Recall 2  Language- name 2 objects 2  Language- repeat 1  Language- follow 3 step command 3  Language- read & follow direction 1  Write a sentence 1  Copy design 1  Total score 28        Immunizations Immunization History  Administered Date(s) Administered  . Influenza Whole 09/30/2014  . Influenza, High Dose Seasonal PF  09/14/2019  . Influenza-Unspecified 09/30/2015, 09/13/2017, 09/19/2018, 09/14/2020  . PFIZER SARS-COV-2 Vaccination 03/15/2020, 04/05/2020, 11/01/2020  . Pneumococcal  Conjugate-13 09/30/2015  . Pneumococcal Polysaccharide-23 12/10/2016  . Tdap 12/09/2015  . Zoster 01/01/2012    TDAP status: Up to date  Flu Vaccine status: Up to date  Pneumococcal vaccine status: Up to date  Covid-19 vaccine status: Completed vaccines  Qualifies for Shingles Vaccine? Yes   Zostavax completed Yes   Shingrix Completed?: No.    Education has been provided regarding the importance of this vaccine. Patient has been advised to call insurance company to determine out of pocket expense if they have not yet received this vaccine. Advised may also receive vaccine at local pharmacy or Health Dept. Verbalized acceptance and understanding.  Screening Tests Health Maintenance  Topic Date Due  . TETANUS/TDAP  12/08/2025  . INFLUENZA VACCINE  Completed  . DEXA SCAN  Completed  . COVID-19 Vaccine  Completed  . Hepatitis C Screening  Completed  . PNA vac Low Risk Adult  Completed    Health Maintenance  There are no preventive care reminders to display for this patient.  Colorectal cancer screening: Type of screening: Colonoscopy. Completed 03/13/2016. Repeat every 10 years  Mammogram status: Completed 06/14/2018. Repeat every year  Bone Density status: Completed 12/18/2018. Results reflect: Bone density results: OSTEOPOROSIS. Repeat every 2 years.  Lung Cancer Screening: (Low Dose CT Chest recommended if Age 79-80 years, 30 pack-year currently smoking OR have quit w/in 15years.) does not qualify.   Lung Cancer Screening Referral: no  Additional Screening:  Hepatitis C Screening: does not qualify; Completed no  Vision Screening: Recommended annual ophthalmology exams for early detection of glaucoma and other disorders of the eye. Is the patient up to date with their annual eye exam?  Yes  Who is the provider or what is the name of the office in which the patient attends annual eye exams? [facility] If pt is not established with a provider, would they like  to be referred to a provider to establish care? No .   Dental Screening: Recommended annual dental exams for proper oral hygiene  Community Resource Referral / Chronic Care Management: CRR required this visit?  No   CCM required this visit?  No      Plan:     I have personally reviewed and noted the following in the patient's chart:   . Medical and social history . Use of alcohol, tobacco or illicit drugs  . Current medications and supplements . Functional ability and status . Nutritional status . Physical activity . Advanced directives . List of other physicians . Hospitalizations, surgeries, and ER visits in previous 12 months . Vitals . Screenings to include cognitive, depression, and falls . Referrals and appointments  In addition, I have reviewed and discussed with patient certain preventive protocols, quality metrics, and best practice recommendations. A written personalized care plan for preventive services as well as general preventive health recommendations were provided to patient.     Mickeal Needy, LPN   03/07/9023   Nurse Notes:  Patient is cogitatively intact. There were no vitals filed for this visit. There is no height or weight on file to calculate BMI. Patient stated that she has no issues with gait or balance; does not use any assistive devices.

## 2021-01-03 ENCOUNTER — Other Ambulatory Visit: Payer: Self-pay | Admitting: Internal Medicine

## 2021-01-25 ENCOUNTER — Other Ambulatory Visit: Payer: Self-pay

## 2021-01-25 ENCOUNTER — Ambulatory Visit: Payer: Medicare Other | Admitting: Pharmacist

## 2021-01-25 DIAGNOSIS — M1711 Unilateral primary osteoarthritis, right knee: Secondary | ICD-10-CM

## 2021-01-25 DIAGNOSIS — E782 Mixed hyperlipidemia: Secondary | ICD-10-CM

## 2021-01-25 DIAGNOSIS — G4700 Insomnia, unspecified: Secondary | ICD-10-CM

## 2021-01-25 NOTE — Chronic Care Management (AMB) (Signed)
Chronic Care Management Pharmacy  Name: Wanda Rivera  MRN: [id number] DOB: 01-27-1941   Chief Complaint/ HPI  Wanda Rivera,  80 y.o. , female presents for their Follow-Up CCM visit with the clinical pharmacist via telephone.  PCP : Hoyt Koch, MD Patient Care Team: Hoyt Koch, MD as PCP - General (Internal Medicine) Adrian Prows, MD as Consulting Physician (Cardiology) Charlton Haws, [facility] as Pharmacist (Pharmacist) Associates, [facility] as Consulting Physician (Ophthalmology)  Their chronic conditions include: Hyperlipidemia, GERD, Osteoporosis and Osteoarthritis, PVD, hx breast cancer (1985), Glaucoma, lumbar compression fracture  Patient lives alone in a 2 story townhouse. She has a son living in [place]. She lived in [place] for many years before moving to [place]. 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 She describes herself as "clumsy" and has had a fall in the past in which she hurt her knee. She does have a cane that she sometimes uses around her home for balance.   Office Visits: 12/21/20 Dr Sharlet Salina OV: chronic f/u. LDL improved 141 > 74.  04/14/20 Dr Sharlet Salina OV: c/o RUQ pain. Referred to sports med  02/23/20 Dr Sharlet Salina OV: c/o RUQ pain. UA and labs wnl, CT normal.  Consult Visit: 11/11/20 ED visit: fall, CT and Xrays normal. Rx'd Voltaren gel and lidocaine patch. 11/04/20 Dr Silverio Decamp (GI): GERD, chronic constipation, RUQ pain. Ordered HIDA scan. Possible IBS - rx'd dicyclomine 10 mg prn. Rx'd Linzess 145 mcg (pt ended up stopping on her own) 10/17/20 Dr Georgina Snell (sports med): may restart duloxetine for pain. Referred to GI. 06/08/20 Dr Manuella Ghazi (ophthalmology): f/u for glaucoma 05/19/20 Dr Georgina Snell (sports med): f/u thoracic pain, improved with Cymbalta and PT. Pt decision to wean off Cymbalta or continue indefinitely 04/21/20 Dr Georgina Snell (sports med): eval for thoracic pain, started cymbalta and referred to PT.   Allergies  Allergen  Reactions  . Darvon [Propoxyphene]     Darvocet-Makes her "crazy"  . Morphine And Related     Makes her "crazy"  . Percocet [Oxycodone-Acetaminophen]     Makes her "crazy"  . Vicodin [Hydrocodone-Acetaminophen]     Makes her "crazy"    Medications: Outpatient Encounter Medications as of 01/25/2021  Medication Sig  . acetaminophen (TYLENOL) 500 MG tablet Take 1,000 mg by mouth every 6 (six) hours as needed for moderate pain or headache.   Marland Kitchen BIOTIN PO Take 1,000 mcg by mouth daily.   . brimonidine (ALPHAGAN) 0.2 % ophthalmic solution Place 1 drop into both eyes in the morning and at bedtime.  . Calcium Carb-Cholecalciferol (CALCIUM + D3) 600-200 MG-UNIT TABS Take 1 tablet by mouth 2 (two) times daily.   . Cholecalciferol (VITAMIN D3) 400 UNITS CAPS Take 400 Units by mouth daily.   . Cyanocobalamin (VITAMIN B 12 PO) Take 500 mg by mouth daily.  . diclofenac Sodium (VOLTAREN) 1 % GEL Apply 4 g topically 4 (four) times daily.  Marland Kitchen dicyclomine (BENTYL) 10 MG capsule Take 1 capsule (10 mg total) by mouth 2 (two) times daily as needed for spasms.  . dorzolamide-timolol (COSOPT) 22.3-6.8 MG/ML ophthalmic solution Place 1 drop into both eyes 2 (two) times daily.   . DULoxetine (CYMBALTA) 20 MG capsule Take 1 capsule (20 mg total) by mouth daily.  Marland Kitchen latanoprost (XALATAN) 0.005 % ophthalmic solution Place 1 drop into both eyes at bedtime.   . lidocaine (LIDODERM) 5 % Place 1 patch onto the skin daily. Remove & Discard patch within 12 hours or as directed by  MD  . meclizine (ANTIVERT) 12.5 MG tablet Take 12.5 mg by mouth 3 (three) times daily as needed for dizziness.   . Multiple Vitamin (ONE-A-DAY ESSENTIAL PO) Take by mouth daily.  . Omega-3 1000 MG CAPS Take 1 capsule by mouth daily.   Marland Kitchen omeprazole (PRILOSEC) 40 MG capsule TAKE 1 CAPSULE TWICE DAILY (DOSE CHANGE)  . polyethylene glycol powder (GLYCOLAX/MIRALAX) powder Take 17 g by mouth 2 (two) times daily as needed. (Patient taking differently:  Take 17 g by mouth 2 (two) times daily as needed for mild constipation.)  . rosuvastatin (CRESTOR) 10 MG tablet TAKE 1 TABLET EVERY DAY  . Turmeric 500 MG TABS Take 1 tablet by mouth daily.    No facility-administered encounter medications on file as of 01/25/2021.   Wt Readings from Last 3 Encounters:  12/21/20 138 lb 1.6 oz (62.6 kg)  11/04/20 137 lb 4 oz (62.3 kg)  10/17/20 138 lb 3.2 oz (62.7 kg)   Lab Results  Component Value Date   CREATININE 1.08 12/21/2020   BUN 13 12/21/2020   GFR 49.28 (L) 12/21/2020   GFRNONAA 48 (L) 05/03/2019   GFRAA 55 (L) 05/03/2019   NA 139 12/21/2020   K 3.8 12/21/2020   CALCIUM 9.2 12/21/2020   CO2 31 12/21/2020     Current Diagnosis/Assessment:    Goals Addressed            This Visit's Progress   . Pharmacy Care Plan       CARE PLAN ENTRY (see longitudinal plan of care for additional care plan information)  Current Barriers:  . Chronic Disease Management support, education, and care coordination needs related to Hyperlipidemia and Osteoarthritis, Insomnia  Hyperlipidemia Lab Results  Component Value Date/Time   LDLCALC 74 12/21/2020 10:36 AM   LDLCALC 132 (H) 08/12/2015 08:00 AM .  Pharmacist Clinical Goal(s): o Over the next 180 days, patient will work with PharmD and providers to achieve LDL goal < 70 . Current regimen:  o Rosuvastatin 10 mg daily . Interventions: o Discussed cholesterol goals and benefits of medications for prevention of heart attack / stroke o LDL improved by about 50% since starting rosuvastatin . Patient self care activities - Over the next 180 days, patient will: o Continue medication as prescribed  Osteoarthritis . Pharmacist Clinical Goal(s) o Over the next 180 days, patient will work with PharmD and providers to optimize therapy . Current regimen:  o Tylenol 500 mg as needed o Duloxetine 20 mg daily at bedtime o Voltaren gel as needed . Interventions: o Advised switch from Tylenol PM to  Tylenol Extra Strength to avoid  o Discussed benefits of duloxetine for pain management . Patient self care activities - Over the next 180 days, patient will: o Use regular Tylenol Extra Strength (not PM) 1-2 tablets up to 3 times daily as needed o Use Voltaren gel up to 4 times daily as needed  Insomnia . Pharmacist Clinical Goal(s) o Over the next 180 days, patient will work with PharmD and providers to optimize therapy . Current regimen:  o No medications . Interventions: o Discussed sleep hygiene - avoiding screens within 30 minutes of bedtime o Discussed benefits of melatonin for sleep . Patient self care activities - Over the next 180 days, patient will: o Continue melatonin for sleep  Medication management . Pharmacist Clinical Goal(s): o Over the next 180 days, patient will work with PharmD and providers to maintain optimal medication adherence . Current pharmacy: [facility] . Interventions o Comprehensive  medication review performed. o Continue current medication management strategy . Patient self care activities - Over the next 180 days, patient will: o Focus on medication adherence by fill date o Take medications as prescribed o Report any questions or concerns to PharmD and/or provider(s)  Please see past updates related to this goal by clicking on the "Past Updates" button in the selected goal        Hyperlipidemia   LDL goal < 70 Atherosclerotic PVD  Last lipids Lab Results  Component Value Date   CHOL 138 12/21/2020   HDL 45.70 12/21/2020   LDLCALC 74 12/21/2020   TRIG 91.0 12/21/2020   CHOLHDL 3 12/21/2020    Hepatic Function Latest Ref Rng & Units 12/21/2020 02/23/2020 12/21/2019  Total Protein 6.0 - 8.3 g/dL 6.8 7.0 6.3  Albumin 3.5 - 5.2 g/dL 4.1 4.3 4.0  AST 0 - 37 U/L '21 19 24  ' ALT 0 - 35 U/L '14 15 16  ' Alk Phosphatase 39 - 117 U/L 109 89 82  Total Bilirubin 0.2 - 1.2 mg/dL 0.5 0.4 0.6    The 10-year ASCVD risk score Mikey Bussing DC Jr., et al., 2013)  is: 11.6%   Values used to calculate the score:     Age: 23 years     Sex: Female     Is Non-Hispanic African American: Yes     Diabetic: No     Tobacco smoker: No     Systolic Blood Pressure: 086 mmHg     Is BP treated: No     HDL Cholesterol: 45.7 mg/dL     Total Cholesterol: 138 mg/dL   Patient has failed these meds in past: n/a Patient is currently controlled on the following medications:  . Rosuvastatin 10 mg daily . OTC Omega-3 1000 mg daily  We discussed:    Plan  Continue current medications  GERD / IBS   Patient has failed these meds in past: n/a Patient is currently controlled on the following medications:  . Omeprazole 40 mg BID . Dicyclomine 10 mg BID prn  We discussed:  Pt started dicyclimine for "spasms" and reports some benefit  Plan  Continue current medications  Chronic pain   Osteoarthritis of knee Lumbar compression fracture  Patient has failed these meds in past: duloxetine Patient is currently controlled on the following medications:  . Tylenol 500 mg PRN . Turmeric 500 mg daily . Duloxetine 20 mg daily HS  We discussed: Pt had restarted duloxetine a few months ago and reports improvement in pain. Duloxetine is helping with sleep as well  Plan  Continue Current medications  Glaucoma   Patient has failed these meds in past: n/a Patient is currently controlled on the following medications:  Marland Kitchen Dorzolamide-Timolol (Cosopt) eye drops BID . Vyzulta (latanoprostene) eye drop . Brimonidine 0.2% eye drops  We discussed:  Pt reports new eye drop (Vyzulta) is very expensive, Pt was given samples so she does not have to pay out of pocket for it. Pt is not sure if she will be taking it indefinitely  Plan  Continue current medications  Consider PAP for Vyzulta if it is is continued indefinitely  Medication Management   Pt uses [facility], [place] for all medications Uses pill box? No - prefers bottles Pt endorses 100%  compliance  We discussed: All medications are $0 through mail order; pt is satisfied with pharmacy services  Plan  Continue current medication management strategy    Follow up: 6 month phone visit  Mendel Ryder  Lenord Fellers, PharmD, [place] Clinical Pharmacist [facility] at [facility] [phone]

## 2021-01-26 NOTE — Patient Instructions (Signed)
Visit Information  Phone number for Pharmacist: [phone]  Goals Addressed            This Visit's Progress   . Pharmacy Care Plan       CARE PLAN ENTRY (see longitudinal plan of care for additional care plan information)  Current Barriers:  . Chronic Disease Management support, education, and care coordination needs related to Hyperlipidemia and Osteoarthritis, Insomnia  Hyperlipidemia Lab Results  Component Value Date/Time   LDLCALC 74 12/21/2020 10:36 AM   LDLCALC 132 (H) 08/12/2015 08:00 AM .  Pharmacist Clinical Goal(s): o Over the next 180 days, patient will work with PharmD and providers to achieve LDL goal < 70 . Current regimen:  o Rosuvastatin 10 mg daily . Interventions: o Discussed cholesterol goals and benefits of medications for prevention of heart attack / stroke o LDL improved by about 50% since starting rosuvastatin . Patient self care activities - Over the next 180 days, patient will: o Continue medication as prescribed  Osteoarthritis . Pharmacist Clinical Goal(s) o Over the next 180 days, patient will work with PharmD and providers to optimize therapy . Current regimen:  o Tylenol 500 mg as needed o Duloxetine 20 mg daily at bedtime o Voltaren gel as needed . Interventions: o Advised switch from Tylenol PM to Tylenol Extra Strength to avoid  o Discussed benefits of duloxetine for pain management . Patient self care activities - Over the next 180 days, patient will: o Use regular Tylenol Extra Strength (not PM) 1-2 tablets up to 3 times daily as needed o Use Voltaren gel up to 4 times daily as needed  Insomnia . Pharmacist Clinical Goal(s) o Over the next 180 days, patient will work with PharmD and providers to optimize therapy . Current regimen:  o No medications . Interventions: o Discussed sleep hygiene - avoiding screens within 30 minutes of bedtime o Discussed benefits of melatonin for sleep . Patient self care activities - Over the  next 180 days, patient will: o Continue melatonin for sleep  Medication management . Pharmacist Clinical Goal(s): o Over the next 180 days, patient will work with PharmD and providers to maintain optimal medication adherence . Current pharmacy: [facility] . Interventions o Comprehensive medication review performed. o Continue current medication management strategy . Patient self care activities - Over the next 180 days, patient will: o Focus on medication adherence by fill date o Take medications as prescribed o Report any questions or concerns to PharmD and/or provider(s)  Please see past updates related to this goal by clicking on the "Past Updates" button in the selected goal       There are no care plans to display for this patient.  The patient verbalized understanding of instructions, educational materials, and care plan provided today and declined offer to receive copy of patient instructions, educational materials, and care plan.  Telephone follow up appointment with pharmacy team member scheduled for: 6 months  Charlene Brooke, PharmD, [facility] Clinical Pharmacist [facility] at [facility] [phone]

## 2021-03-02 DIAGNOSIS — H401111 Primary open-angle glaucoma, right eye, mild stage: Secondary | ICD-10-CM | POA: Diagnosis not present

## 2021-03-09 ENCOUNTER — Telehealth: Payer: Self-pay | Admitting: *Deleted

## 2021-03-09 MED ORDER — DICYCLOMINE HCL 10 MG PO CAPS: 10.0000 mg | ORAL_CAPSULE | Freq: Two times a day (BID) | ORAL | 3 refills | Status: DC | PRN

## 2021-03-09 NOTE — Telephone Encounter (Signed)
Received fax from [facility] that the patient needed refills of dicyclomine sent electronically too patients  mail order pharmacy today

## 2021-04-03 ENCOUNTER — Telehealth: Payer: Self-pay | Admitting: Pharmacist

## 2021-04-03 NOTE — Progress Notes (Signed)
Chronic Care Management Pharmacy Assistant   Name: Wanda Rivera  MRN: [id number] DOB: 01-Feb-1941   Reason for Encounter: General Disease State Call    Recent office visits:  None ID  Recent consult visits:  None ID  Hospital visits:  None in previous 6 months  Medications: Outpatient Encounter Medications as of 04/03/2021  Medication Sig  . acetaminophen (TYLENOL) 500 MG tablet Take 1,000 mg by mouth every 6 (six) hours as needed for moderate pain or headache.   Marland Kitchen BIOTIN PO Take 1,000 mcg by mouth daily.   . brimonidine (ALPHAGAN) 0.2 % ophthalmic solution Place 1 drop into both eyes in the morning and at bedtime.  . Calcium Carb-Cholecalciferol (CALCIUM + D3) 600-200 MG-UNIT TABS Take 1 tablet by mouth 2 (two) times daily.   . Cholecalciferol (VITAMIN D3) 400 UNITS CAPS Take 400 Units by mouth daily.   . Cyanocobalamin (VITAMIN B 12 PO) Take 500 mg by mouth daily.  . diclofenac Sodium (VOLTAREN) 1 % GEL Apply 4 g topically 4 (four) times daily.  Marland Kitchen dicyclomine (BENTYL) 10 MG capsule Take 1 capsule (10 mg total) by mouth 2 (two) times daily as needed for spasms.  . dorzolamide-timolol (COSOPT) 22.3-6.8 MG/ML ophthalmic solution Place 1 drop into both eyes 2 (two) times daily.   . DULoxetine (CYMBALTA) 20 MG capsule Take 1 capsule (20 mg total) by mouth daily.  Marland Kitchen latanoprost (XALATAN) 0.005 % ophthalmic solution Place 1 drop into both eyes at bedtime.   . lidocaine (LIDODERM) 5 % Place 1 patch onto the skin daily. Remove & Discard patch within 12 hours or as directed by MD  . meclizine (ANTIVERT) 12.5 MG tablet Take 12.5 mg by mouth 3 (three) times daily as needed for dizziness.   . Multiple Vitamin (ONE-A-DAY ESSENTIAL PO) Take by mouth daily.  . Omega-3 1000 MG CAPS Take 1 capsule by mouth daily.   Marland Kitchen omeprazole (PRILOSEC) 40 MG capsule TAKE 1 CAPSULE TWICE DAILY (DOSE CHANGE)  . polyethylene glycol powder (GLYCOLAX/MIRALAX) powder Take 17 g by mouth 2 (two) times daily as  needed. (Patient taking differently: Take 17 g by mouth 2 (two) times daily as needed for mild constipation.)  . rosuvastatin (CRESTOR) 10 MG tablet TAKE 1 TABLET EVERY DAY  . Turmeric 500 MG TABS Take 1 tablet by mouth daily.    No facility-administered encounter medications on file as of 04/03/2021.    Pharmacist Review:  What problems have you had with your health? The patient states that overall she has been doing good, but she is having some issues with constipation and stomach problems a sharp pain that last only a few seconds. Also patient is wanting to know if she is going to need another ultrasound on her bladder to see if the polyps that were found are gone or have grown any. The patient is really worried about that and wants to see what has happened since last year  Are you having any side effects from medication? The patient states that she is having a lot of drowsiness when she takes the dicylomine  What issues are you having with your current medications? The patient states that she does not have any issues with currnet What would you like for me to pass along to [facility] for them to help you better? Patient just states she will like to know what else she can do to help with her constipation, only had some samples of Linzess last year. Laxatives make her nauseaous  Star Rating Drugs: Rosuvastatin 10 mg daily; last filled on 03/22/21  [place] Pharmacist Assistant [phone]  Time spent:40 minutes

## 2021-04-05 NOTE — Progress Notes (Addendum)
Spoke with the patient and advised patient per Mendel Ryder to try benefiber or metamucil for constipation. Also get a stool softener like colace. Patient stated that she will try one of these today.  Wendy Poet, [place]

## 2021-04-14 DIAGNOSIS — Z23 Encounter for immunization: Secondary | ICD-10-CM | POA: Diagnosis not present

## 2021-05-02 ENCOUNTER — Telehealth: Payer: Self-pay

## 2021-05-02 ENCOUNTER — Other Ambulatory Visit: Payer: Self-pay

## 2021-05-02 DIAGNOSIS — K824 Cholesterolosis of gallbladder: Secondary | ICD-10-CM

## 2021-05-02 NOTE — Telephone Encounter (Signed)
Scheduled patient for her follow up u/s of the gallbladder.  Dx gallbladder polyp. 05/18/21 at 8:45 am arrive 8:30 am. NPO p MN.  [facility] [address]

## 2021-05-17 ENCOUNTER — Telehealth: Payer: Self-pay | Admitting: Pharmacist

## 2021-05-17 NOTE — Progress Notes (Signed)
Chronic Care Management Pharmacy Assistant   Name: Seletha Zimmermann  MRN: [id number] DOB: 1941-02-25  Reason for Encounter: Disease State - General Adherence  Recent office visits:  None noted  Recent consult visits:  None noted  Hospital visits:  None in previous 6 months  Medications: Outpatient Encounter Medications as of 05/17/2021  Medication Sig  . acetaminophen (TYLENOL) 500 MG tablet Take 1,000 mg by mouth every 6 (six) hours as needed for moderate pain or headache.   Marland Kitchen BIOTIN PO Take 1,000 mcg by mouth daily.   . brimonidine (ALPHAGAN) 0.2 % ophthalmic solution Place 1 drop into both eyes in the morning and at bedtime.  . Calcium Carb-Cholecalciferol (CALCIUM + D3) 600-200 MG-UNIT TABS Take 1 tablet by mouth 2 (two) times daily.   . Cholecalciferol (VITAMIN D3) 400 UNITS CAPS Take 400 Units by mouth daily.   . Cyanocobalamin (VITAMIN B 12 PO) Take 500 mg by mouth daily.  . diclofenac Sodium (VOLTAREN) 1 % GEL Apply 4 g topically 4 (four) times daily.  Marland Kitchen dicyclomine (BENTYL) 10 MG capsule Take 1 capsule (10 mg total) by mouth 2 (two) times daily as needed for spasms.  . dorzolamide-timolol (COSOPT) 22.3-6.8 MG/ML ophthalmic solution Place 1 drop into both eyes 2 (two) times daily.   . DULoxetine (CYMBALTA) 20 MG capsule Take 1 capsule (20 mg total) by mouth daily.  Marland Kitchen latanoprost (XALATAN) 0.005 % ophthalmic solution Place 1 drop into both eyes at bedtime.   . lidocaine (LIDODERM) 5 % Place 1 patch onto the skin daily. Remove & Discard patch within 12 hours or as directed by MD  . meclizine (ANTIVERT) 12.5 MG tablet Take 12.5 mg by mouth 3 (three) times daily as needed for dizziness.   . Multiple Vitamin (ONE-A-DAY ESSENTIAL PO) Take by mouth daily.  . Omega-3 1000 MG CAPS Take 1 capsule by mouth daily.   Marland Kitchen omeprazole (PRILOSEC) 40 MG capsule TAKE 1 CAPSULE TWICE DAILY (DOSE CHANGE)  . polyethylene glycol powder (GLYCOLAX/MIRALAX) powder Take 17 g by mouth 2 (two) times  daily as needed. (Patient taking differently: Take 17 g by mouth 2 (two) times daily as needed for mild constipation.)  . rosuvastatin (CRESTOR) 10 MG tablet TAKE 1 TABLET EVERY DAY  . Turmeric 500 MG TABS Take 1 tablet by mouth daily.    No facility-administered encounter medications on file as of 05/17/2021.    Have you had any problems recently with your health? Patient states she has been stressed out due to the pain on her right side, she's going for an Ultrasound on her gall bladder on 05/18/21. Patient states this a follow-up ultrasound from last year when they discovered a polyp. Patient states the pain is so bad it stops her from being able to do anything.  Have you had any problems with your pharmacy? Patient states there's no problems with pharmacy.   What issues or side effects are you having with your medications? Patient states no side effects at this time.   What would you like me to pass along to [facility] for them to help you with?  Patient states she takes dicyclomine 2 times daily as needed for spasms and its not helping, she recently started taking tylenol and is afraid of having an interaction from too many medications together.  What can we do to take care of you better? Patient states nothing at this time and she will call and update Dr. Sharlet Salina after her ultrasound.  Star Rating Drugs:  Rosuvastatin - last fill 01/04/21 90D  (Patient states she got a refill on 03/22/21 90D)   Orinda Kenner, RMA Clinical Pharmacists Assistant [phone]  Time Spent:40

## 2021-05-18 ENCOUNTER — Ambulatory Visit
Admission: RE | Admit: 2021-05-18 | Discharge: 2021-05-18 | Disposition: A | Discharge status: Home / Self Care | Payer: Medicare Other | Source: Ambulatory Visit | Attending: Gastroenterology | Admitting: Gastroenterology

## 2021-05-18 DIAGNOSIS — K828 Other specified diseases of gallbladder: Secondary | ICD-10-CM | POA: Diagnosis not present

## 2021-05-18 DIAGNOSIS — K824 Cholesterolosis of gallbladder: Secondary | ICD-10-CM

## 2021-05-25 NOTE — Progress Notes (Addendum)
Called and spoke with patient to let her know it is OK to take Tylenol and Dicylomine together, and there is no interaction. Patient acknowledged understanding.  Orinda Kenner, [place] Clinical Pharmacists Assistant [phone]  Time Spent: [phone]

## 2021-05-30 NOTE — Progress Notes (Signed)
    Chronic Care Management Pharmacy Assistant   Name: Wanda Rivera  MRN: [id number] DOB: 1941-12-16   Medications: Outpatient Encounter Medications as of 05/17/2021  Medication Sig  . acetaminophen (TYLENOL) 500 MG tablet Take 1,000 mg by mouth every 6 (six) hours as needed for moderate pain or headache.   Marland Kitchen BIOTIN PO Take 1,000 mcg by mouth daily.   . brimonidine (ALPHAGAN) 0.2 % ophthalmic solution Place 1 drop into both eyes in the morning and at bedtime.  . Calcium Carb-Cholecalciferol (CALCIUM + D3) 600-200 MG-UNIT TABS Take 1 tablet by mouth 2 (two) times daily.   . Cholecalciferol (VITAMIN D3) 400 UNITS CAPS Take 400 Units by mouth daily.   . Cyanocobalamin (VITAMIN B 12 PO) Take 500 mg by mouth daily.  . diclofenac Sodium (VOLTAREN) 1 % GEL Apply 4 g topically 4 (four) times daily.  Marland Kitchen dicyclomine (BENTYL) 10 MG capsule Take 1 capsule (10 mg total) by mouth 2 (two) times daily as needed for spasms.  . dorzolamide-timolol (COSOPT) 22.3-6.8 MG/ML ophthalmic solution Place 1 drop into both eyes 2 (two) times daily.   . DULoxetine (CYMBALTA) 20 MG capsule Take 1 capsule (20 mg total) by mouth daily.  Marland Kitchen latanoprost (XALATAN) 0.005 % ophthalmic solution Place 1 drop into both eyes at bedtime.   . lidocaine (LIDODERM) 5 % Place 1 patch onto the skin daily. Remove & Discard patch within 12 hours or as directed by MD  . meclizine (ANTIVERT) 12.5 MG tablet Take 12.5 mg by mouth 3 (three) times daily as needed for dizziness.   . Multiple Vitamin (ONE-A-DAY ESSENTIAL PO) Take by mouth daily.  . Omega-3 1000 MG CAPS Take 1 capsule by mouth daily.   Marland Kitchen omeprazole (PRILOSEC) 40 MG capsule TAKE 1 CAPSULE TWICE DAILY (DOSE CHANGE)  . polyethylene glycol powder (GLYCOLAX/MIRALAX) powder Take 17 g by mouth 2 (two) times daily as needed. (Patient taking differently: Take 17 g by mouth 2 (two) times daily as needed for mild constipation.)  . rosuvastatin (CRESTOR) 10 MG tablet TAKE 1 TABLET EVERY DAY   . Turmeric 500 MG TABS Take 1 tablet by mouth daily.    No facility-administered encounter medications on file as of 05/17/2021.    Pharmacist Review  Reviewed chart for medication changes and adherence.  No OVs, Consults, or hospital visits since last care coordination call / Pharmacist visit. No medication changes indicated  No gaps in adherence identified. Patient has follow up scheduled with pharmacy team. No further action required.  [place] Pharmacist Assistant [phone]  Time spent:6

## 2021-06-14 ENCOUNTER — Other Ambulatory Visit: Payer: Self-pay | Admitting: Family Medicine

## 2021-06-15 NOTE — Telephone Encounter (Signed)
Please advise 

## 2021-07-26 ENCOUNTER — Telehealth: Payer: Medicare Other

## 2021-08-01 ENCOUNTER — Telehealth: Payer: Self-pay | Admitting: Pharmacist

## 2021-08-01 ENCOUNTER — Encounter: Payer: Self-pay | Admitting: Gastroenterology

## 2021-08-01 ENCOUNTER — Ambulatory Visit (INDEPENDENT_AMBULATORY_CARE_PROVIDER_SITE_OTHER): Payer: Medicare Other | Admitting: Gastroenterology

## 2021-08-01 VITALS — BP 122/64 | HR 72 | Ht 64.0 in | Wt 135.5 lb

## 2021-08-01 DIAGNOSIS — R109 Unspecified abdominal pain: Secondary | ICD-10-CM

## 2021-08-01 DIAGNOSIS — M79604 Pain in right leg: Secondary | ICD-10-CM | POA: Diagnosis not present

## 2021-08-01 DIAGNOSIS — K581 Irritable bowel syndrome with constipation: Secondary | ICD-10-CM | POA: Diagnosis not present

## 2021-08-01 MED ORDER — LINACLOTIDE 145 MCG PO CAPS: 145.0000 ug | ORAL_CAPSULE | ORAL | 0 refills | Status: DC | PRN

## 2021-08-01 NOTE — Progress Notes (Signed)
    Chronic Care Management Pharmacy Assistant   Name: Wanda Rivera  MRN: [id number] DOB: 20-Apr-1941   A call was made to [name] to reschedule her appointment with the clinical pharmacist from 08/02/21 to 08/24/21 at [facility] Pharmacist Assistant [phone]   Time spent:7

## 2021-08-01 NOTE — Patient Instructions (Addendum)
If you are age 79 or older, your body mass index should be between 23-30. Your Body mass index is 23.26 kg/m. If this is out of the aforementioned range listed, please consider follow up with your Primary Care Provider. __________________________________________________________  The [facility] providers would like to encourage you to use [facility] to communicate with providers for non-urgent requests or questions.  Due to long hold times on the telephone, sending your provider a message by [facility] may be a faster and more efficient way to get a response.  Please allow 48 business hours for a response.  Please remember that this is for non-urgent requests.   Use Prune juice with one capful of Miralax as needed.  Use Linzess 121mg as needed.  Call Dr CGeorgina Snellat [facility]for right sided abdominal pain that radiates to right leg.  It was a pleasure to see you today!  Thank you for trusting me with your gastrointestinal care!    Dr NSilverio Decamp

## 2021-08-01 NOTE — Progress Notes (Signed)
Wanda Rivera    [id number]    07-Feb-1941  Primary Care Physician:Crawford, Real Cons, MD  Referring Physician: Hoyt Koch, MD [place],  [place] [id number]   Chief complaint:  R side abd discomfort   HPI:  80 year old female here for follow-up visit for right side abdominal pain, constipation, nausea and GERD.   Continues to have intermittent right-sided abdominal pain radiating down the right thigh.  Denies any recent injury or change in activity.  No relationship with diet or bowel habits. She has history of back injury.  She also bruised her ribs but she feels this pain in the right side of abdomen radiating down her thigh is different.  Dr. Sharlet Salina referred her to Dr. Georgina Snell at sports medicine for possible musculoskeletal pain, underwent physical therapy and was also started on Cymbalta.  She noticed some transient improvement but her symptoms recurred   GERD symptoms improved with omeprazole.  She has intermittent nausea.     Continues to have intermittent constipation, she is drinking prune juice daily.  Denies any rectal bleeding, melena or vomiting.     Right upper quadrant ultrasound April 27, 2020: Diminutive 2 mm gallbladder polyp otherwise no cholecystitis   CT abdomen pelvis March 11, 2020: Mild to moderate colonic stool burden otherwise unremarkable     EGD February 20, 2017: Normal esophagus, mild gastritis biopsies negative for H. pylori, duodenal erosions otherwise normal.   Colonoscopy March 13, 2016: No polyps normal exam, recall colonoscopy in 10 year   Outpatient Encounter Medications as of 08/01/2021  Medication Sig   acetaminophen (TYLENOL) 500 MG tablet Take 1,000 mg by mouth every 6 (six) hours as needed for moderate pain or headache.    BIOTIN PO Take 1,000 mcg by mouth daily.    brimonidine (ALPHAGAN) 0.2 % ophthalmic solution Place 1 drop into both eyes in the morning and at bedtime.   Calcium  Carb-Cholecalciferol (CALCIUM + D3) 600-200 MG-UNIT TABS Take 1 tablet by mouth 2 (two) times daily.    Cholecalciferol (VITAMIN D3) 400 UNITS CAPS Take 400 Units by mouth daily.    Cyanocobalamin (VITAMIN B 12 PO) Take 500 mg by mouth daily.   diclofenac Sodium (VOLTAREN) 1 % GEL Apply 4 g topically 4 (four) times daily.   dicyclomine (BENTYL) 10 MG capsule Take 1 capsule (10 mg total) by mouth 2 (two) times daily as needed for spasms.   dorzolamide-timolol (COSOPT) 22.3-6.8 MG/ML ophthalmic solution Place 1 drop into both eyes 2 (two) times daily.    DULoxetine (CYMBALTA) 20 MG capsule TAKE 1 CAPSULE EVERY DAY   latanoprost (XALATAN) 0.005 % ophthalmic solution Place 1 drop into both eyes at bedtime.    lidocaine (LIDODERM) 5 % Place 1 patch onto the skin daily. Remove & Discard patch within 12 hours or as directed by MD   meclizine (ANTIVERT) 12.5 MG tablet Take 12.5 mg by mouth 3 (three) times daily as needed for dizziness.    Multiple Vitamin (ONE-A-DAY ESSENTIAL PO) Take by mouth daily.   Omega-3 1000 MG CAPS Take 1 capsule by mouth daily.    omeprazole (PRILOSEC) 40 MG capsule TAKE 1 CAPSULE TWICE DAILY (DOSE CHANGE)   polyethylene glycol (MIRALAX / GLYCOLAX) 17 g packet Take 17 g by mouth as needed.   rosuvastatin (CRESTOR) 10 MG tablet TAKE 1 TABLET EVERY DAY   Turmeric 500 MG TABS Take 1 tablet by mouth daily.    [DISCONTINUED]  polyethylene glycol powder (GLYCOLAX/MIRALAX) powder Take 17 g by mouth 2 (two) times daily as needed. (Patient not taking: Reported on 08/01/2021)   No facility-administered encounter medications on file as of 08/01/2021.    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,hands   Breast  cancer ([place])    1982, recurrence in 1985   Cataract    Bil   Glaucoma    Bil   Osteoporosis     Past Surgical History:  Procedure Laterality Date   ABDOMINAL HYSTERECTOMY     BREAST SURGERY     double masectomy for breast cancer   eye surgery Bilateral 06/05/2017   for Glaucoma    Family History  Problem Relation Age of Onset   Cancer Mother    Breast cancer Mother    Liver cancer Mother    Diabetes Father    Hypertension Father    Hypertension Sister    Diabetes Brother    Heart disease Brother     Social History   Socioeconomic History   Marital status: Divorced    Spouse name: Not on file   Number of children: 1   Years of education: Not on file   Highest education level: Not on file  Occupational History   Occupation: retired  Tobacco Use   Smoking status: Never   Smokeless tobacco: Never  Vaping Use   Vaping Use: Never used  Substance and Sexual Activity   Alcohol use: Yes    Alcohol/week: 0.0 standard drinks    Comment: occasionally a glass of red wine   Drug use: No   Sexual activity: Never  Other Topics Concern   Not on file  Social History Narrative   Not on file   Social Determinants of Health   Financial Resource Strain: Low Risk    Difficulty of Paying Living Expenses: Not very hard  Food Insecurity: No Food Insecurity   Worried About Running Out of Food in the Last Year: Never true   Ran Out of Food in the Last Year: Never true  Transportation Needs: No Transportation Needs   Lack of Transportation (Medical): No   Lack of Transportation (Non-Medical): No  Physical Activity: Sufficiently Active   Days of Exercise per Week: 5 days   Minutes of Exercise per Session: 30 min  Stress: No Stress Concern Present   Feeling of Stress : Not at all  Social Connections: Moderately Integrated   Frequency of Communication with Friends and Family: More than three times a week   Frequency of Social Gatherings with Friends and Family: Once a week    Attends Religious Services: 1 to 4 times per year   Active Member of [employer] or Organizations: No   Attends Archivist Meetings: 1 to 4 times per year   Marital Status: Widowed  Human resources officer Violence: Not At Risk   Fear of Current or Ex-Partner: No   Emotionally Abused: No   Physically Abused: No   Sexually Abused: No      Review of systems: All other review of systems negative except as mentioned in the HPI.   Physical Exam: Vitals:   08/01/21 1342  BP: 122/64  Pulse: 72  SpO2: 97%   Body mass  index is 23.26 kg/m. Gen:      No acute distress HEENT:  sclera anicteric Abd:      soft, non-tender; no palpable masses, no distension Ext:    No edema Neuro: alert and oriented x 3 Psych: normal mood and affect  Data Reviewed:  Reviewed labs, radiology imaging, old records and pertinent past GI work up   Assessment and Plan/Recommendations:  80 year old very pleasant female with history of chronic GERD, chronic  constipation, nausea and right side abdominal pain   Right side abdominal pain:  Patient had extensive GI work-up, no significant GI pathology.  Reassured patient that the gallbladder polyp was 2 mm and was benign appearing.  Will repeat abdominal ultrasound May 2022 for surveillance   Abdominal pain radiating down thigh could be musculoskeletal etiology, advised patient to follow-up with Dr. Georgina Snell   IBS predominant constipation: Continue prune juice, high-fiber diet and increase water intake Use MiraLAX 1 capful daily Patient was provided samples for Linzess 145 mcg daily and advised her to take it as needed  The patient was provided an opportunity to ask questions and all were answered. The patient agreed with the plan and demonstrated an understanding of the instructions.  Damaris Hippo , MD    CC: Hoyt Koch, *

## 2021-08-02 ENCOUNTER — Telehealth: Payer: Medicare Other

## 2021-08-03 ENCOUNTER — Encounter: Payer: Self-pay | Admitting: Gastroenterology

## 2021-08-03 DIAGNOSIS — H26493 Other secondary cataract, bilateral: Secondary | ICD-10-CM | POA: Diagnosis not present

## 2021-08-22 ENCOUNTER — Other Ambulatory Visit: Payer: Self-pay

## 2021-08-22 ENCOUNTER — Ambulatory Visit (INDEPENDENT_AMBULATORY_CARE_PROVIDER_SITE_OTHER): Payer: Medicare Other

## 2021-08-22 ENCOUNTER — Ambulatory Visit: Payer: Self-pay

## 2021-08-22 ENCOUNTER — Ambulatory Visit (INDEPENDENT_AMBULATORY_CARE_PROVIDER_SITE_OTHER): Payer: Medicare Other | Admitting: Family Medicine

## 2021-08-22 VITALS — BP 142/88 | HR 53 | Ht 64.0 in | Wt 135.4 lb

## 2021-08-22 DIAGNOSIS — M25551 Pain in right hip: Secondary | ICD-10-CM

## 2021-08-22 DIAGNOSIS — M79644 Pain in right finger(s): Secondary | ICD-10-CM | POA: Diagnosis not present

## 2021-08-22 DIAGNOSIS — M545 Low back pain, unspecified: Secondary | ICD-10-CM | POA: Diagnosis not present

## 2021-08-22 NOTE — Progress Notes (Signed)
I, Peterson Lombard, LAT, ATC acting as a scribe for Lynne Leader, MD.  Wanda Rivera is a 80 y.o. female who presents to [place] at [facility] today for continued RUQ & RLQ pain. Pt was previously seen by Dr. Georgina Snell on 10/17/20 for this issue and was referred back to GI for further evaluation and management. Today, pt reports she is still having R-sided abdominal pain that radiates distally into R anterior thigh. Pt notes hx of a hysterectomy in 2009. Pt reports a R-sided low back injury in the early 1990's and suffered a fall in Nov 2021 and injured/bruised her R ribs  Back pain: no Radiates: yes- R thigh LE numbness/tingling: no LE weakness: no Aggravates: nothing Treatments tried: PT, GI work-up, Bentyl  Pt also c/o R thumb pain x 1 month. Pt locates pain to R 1st MCP joint. Pt notes increased pain at night and will sometimes experience the joint "sticking."  Dx testing: 05/18/21 RUQ abdominal US  11/25/20 NM Hepato  11/11/20 R ribs/chest XR  04/27/20 RUQ abdominal US  04/14/20 T-spine XR  03/11/20 Abdominal CT  Pertinent review of systems: No fevers or chills  Relevant historical information: History of compression fracture L3 vertebrae.   Exam:  BP (!) 142/88   Pulse (!) 53   Ht '5\' 4"'$  (1.626 m)   Wt 135 lb 6.4 oz (61.4 kg)   SpO2 98%   BMI 23.24 kg/m  General: Well Developed, well nourished, and in no acute distress.   MSK: Right hip normal-appearing Normal hip motion. Intact strength hip motion.  L-spine nontender normal lumbar motion.  Right thumb normal-appearing Tender palpation palmar MCP. Triggering present with motion of IP joint.   Lab and Radiology Results  Procedure: Real-time Ultrasound Guided Injection of right thumb MCP A1 pulley tendon sheath (trigger finger injection) Device: Philips Affiniti 50G Images permanently stored and available for review in PACS Verbal informed consent obtained.  Discussed risks and benefits of  procedure. Warned about infection bleeding damage to structures skin hypopigmentation and fat atrophy among others. Patient expresses understanding and agreement Time-out conducted.   Noted no overlying erythema, induration, or other signs of local infection.   Skin prepped in a sterile fashion.   Local anesthesia: Topical Ethyl chloride.   With sterile technique and under real time ultrasound guidance: 0.5 mL of Depo-Medrol 80 mg/mL solution and 0.5 mL of lidocaine injected into tendon sheath at A1 pulley. Fluid seen entering the tendon sheath.   Completed without difficulty   Pain immediately resolved suggesting accurate placement of the medication.   Advised to call if fevers/chills, erythema, induration, drainage, or persistent bleeding.   Images permanently stored and available for review in the ultrasound unit.  Impression: Technically successful ultrasound guided injection.    X-ray images right hip and L-spine obtained today personally and independently interpreted  L-spine: L3 compression fracture chronic appearing degenerative changes present no acute fractures are visible.  Right hip: Mild DJD present.  Abnormal appearance acetabular rim.  Await formal radiology review     Assessment and Plan: 80 y.o. female with right thumb pain due to trigger thumb.  Plan for injection today double band splint and Voltaren gel.  Right thigh pain unclear etiology.  Pain possibly due to interarticular hip cause.  Await radiology overread.  Lumbar radiculopathy at L2 or 3 is also a possibility.  Recheck in a month.  Consider trial of diagnostic and injection if not improved.  Ultimately may benefit from hip MRI  arthrogram.   PDMP not reviewed this encounter. Orders Placed This Encounter  Procedures   [place] LIMITED JOINT SPACE STRUCTURES UP RIGHT(NO LINKED CHARGES)    Standing Status:   Future    Number of Occurrences:   1    Standing Expiration Date:   02/22/2022    Order Specific  Question:   Reason for Exam (SYMPTOM  OR DIAGNOSIS REQUIRED)    Answer:   right thumb pain    Order Specific Question:   Preferred imaging location?    Answer:   [facility]   DG Lumbar Spine 2-3 Views    Standing Status:   Future    Number of Occurrences:   1    Standing Expiration Date:   08/22/2022    Order Specific Question:   Reason for Exam (SYMPTOM  OR DIAGNOSIS REQUIRED)    Answer:   eval thugh pain    Order Specific Question:   Preferred imaging location?    Answer:   [facility]   DG HIP UNILAT WITH PELVIS 2-3 VIEWS RIGHT    Standing Status:   Future    Number of Occurrences:   1    Standing Expiration Date:   08/22/2022    Order Specific Question:   Reason for Exam (SYMPTOM  OR DIAGNOSIS REQUIRED)    Answer:   eval hip pain    Order Specific Question:   Preferred imaging location?    Answer:   [facility]   No orders of the defined types were placed in this encounter.    Discussed warning signs or symptoms. Please see discharge instructions. Patient expresses understanding.   The above documentation has been reviewed and is accurate and complete Lynne Leader, M.D.

## 2021-08-22 NOTE — Patient Instructions (Addendum)
Good to see you today.  You had a R thumb injection today.  Call or go to the ER if you develop a large red swollen joint with extreme pain or oozing puss.   Please get an Xray today before you leave   Recheck in 1 month.   Use the double bandaid splint.

## 2021-08-23 NOTE — Progress Notes (Signed)
Xray Lumbar spine shows a stable appearing compression fracture at L3. 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

## 2021-08-23 NOTE — Progress Notes (Signed)
Right hip xray shows medium arthritis.

## 2021-08-24 ENCOUNTER — Ambulatory Visit (INDEPENDENT_AMBULATORY_CARE_PROVIDER_SITE_OTHER): Payer: Medicare Other | Admitting: Pharmacist

## 2021-08-24 ENCOUNTER — Other Ambulatory Visit: Payer: Self-pay

## 2021-08-24 DIAGNOSIS — E782 Mixed hyperlipidemia: Secondary | ICD-10-CM | POA: Diagnosis not present

## 2021-08-24 DIAGNOSIS — K219 Gastro-esophageal reflux disease without esophagitis: Secondary | ICD-10-CM

## 2021-08-24 DIAGNOSIS — I70209 Unspecified atherosclerosis of native arteries of extremities, unspecified extremity: Secondary | ICD-10-CM

## 2021-08-24 DIAGNOSIS — K5904 Chronic idiopathic constipation: Secondary | ICD-10-CM

## 2021-08-24 DIAGNOSIS — M1711 Unilateral primary osteoarthritis, right knee: Secondary | ICD-10-CM

## 2021-08-24 NOTE — Progress Notes (Signed)
 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 Unfortunately she will not qualify for Linzess pt assistance -Pt is due for annual PCP visit in Dec 2022  Recommendations/Changes made from today's visit: -Advised to ask GI for more Linzess samples if needed -Advised pt to make PCP appt for Dec 2022   Subjective: Wanda Rivera is an 80 y.o. year old female who is a primary patient of Crawford, Elizabeth A, MD.  The CCM team was consulted for assistance with disease management and care coordination needs.    Engaged with patient by telephone for follow up visit in response to provider referral for pharmacy case management and/or care coordination services.   Consent to Services:  The patient was given information about Chronic Care Management services, agreed to services, and gave verbal consent prior to initiation of services.  Please see initial visit note for detailed documentation.   Patient Care Team: Crawford, Elizabeth A, MD as PCP - General (Internal Medicine) Ganji, Jay, MD as Consulting Physician (Cardiology) Foltanski, Lindsey N, RPH as Pharmacist (Pharmacist) [facility] as Consulting Physician (Ophthalmology)   Patient lives alone in a 2 story townhouse. She has a son living in [place]. She lived in [place] for many years before moving to [place]. 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 She describes herself as "clumsy" and has had a fall in the past in which she hurt her knee. She does have a cane that she sometimes uses around her home for balance.   Recent office visits: 12/21/20 Dr Crawford OV: chronic f/u. LDL improved 141 > 74.   Recent consult visits: 08/22/21 Dr Corey (sports med): f/u hip and thumn pain; given steroid injection for trigger thumb. Rec'd  voltaren gel.  08/01/21 Dr Nandigam (GI): f/u abd discomfort. IBS-C - pruine juice, fiber diet, Miralax and provided Linzess samples.  Hospital visits: None in previous 6 months   Objective:  Lab Results  Component Value Date   CREATININE 1.08 12/21/2020   BUN 13 12/21/2020   GFR 49.28 (L) 12/21/2020   GFRNONAA 48 (L) 05/03/2019   GFRAA 55 (L) 05/03/2019   NA 139 12/21/2020   K 3.8 12/21/2020   CALCIUM 9.2 12/21/2020   CO2 31 12/21/2020   GLUCOSE 68 (L) 12/21/2020    Lab Results  Component Value Date/Time   HGBA1C 5.6 12/21/2020 10:36 AM   GFR 49.28 (L) 12/21/2020 10:36 AM   GFR 64.98 02/23/2020 11:19 AM    Last diabetic Eye exam: No results found for: HMDIABEYEEXA  Last diabetic Foot exam: No results found for: HMDIABFOOTEX   Lab Results  Component Value Date   CHOL 138 12/21/2020   HDL 45.70 12/21/2020   LDLCALC 74 12/21/2020   TRIG 91.0 12/21/2020   CHOLHDL 3 12/21/2020    Hepatic Function Latest Ref Rng & Units 12/21/2020 02/23/2020 12/21/2019  Total Protein 6.0 - 8.3 g/dL 6.8 7.0 6.3  Albumin 3.5 - 5.2 g/dL 4.1 4.3 4.0  AST 0 - 37 U/L 21 19 24  ALT 0 - 35 U/L 14 15 16  Alk Phosphatase 39 - 117 U/L 109 89 82  Total Bilirubin 0.2 - 1.2 mg/dL 0.5 0.4 0.6    No results found for: TSH, FREET4  CBC Latest Ref Rng & Units 12/21/2020 02/23/2020 12/21/2019  WBC   4.0 - 10.5 K/uL 6.1 6.1 6.0  Hemoglobin 12.0 - 15.0 g/dL 13.5 13.4 13.1  Hematocrit 36.0 - 46.0 % 41.1 40.5 39.9  Platelets 150.0 - 400.0 K/uL 176.0 178.0 176.0    No results found for: VD25OH  Clinical ASCVD: Yes  The 10-year ASCVD risk score (Goff DC Jr., et al., 2013) is: 15.1%   Values used to calculate the score:     Age: 80 years     Sex: Female     Is Non-Hispanic African American: Yes     Diabetic: No     Tobacco smoker: No     Systolic Blood Pressure: 142 mmHg     Is BP treated: No     HDL Cholesterol: 45.7 mg/dL     Total Cholesterol: 138 mg/dL    Depression screen PHQ 2/9 01/02/2021  12/21/2020 12/21/2019  Decreased Interest 0 0 0  Down, Depressed, Hopeless 0 1 0  PHQ - 2 Score 0 1 0  Altered sleeping - 3 -  Tired, decreased energy - 1 -  Change in appetite - 1 -  Feeling bad or failure about yourself  - 0 -  Trouble concentrating - 0 -  Moving slowly or fidgety/restless - 0 -  Suicidal thoughts - 0 -  PHQ-9 Score - 6 -  Difficult doing work/chores - Not difficult at all -     Social History   Tobacco Use  Smoking Status Never  Smokeless Tobacco Never   BP Readings from Last 3 Encounters:  08/22/21 (!) 142/88  08/01/21 122/64  12/21/20 114/80   Pulse Readings from Last 3 Encounters:  08/22/21 (!) 53  08/01/21 72  12/21/20 62   Wt Readings from Last 3 Encounters:  08/22/21 135 lb 6.4 oz (61.4 kg)  08/01/21 135 lb 8 oz (61.5 kg)  12/21/20 138 lb 1.6 oz (62.6 kg)   BMI Readings from Last 3 Encounters:  08/22/21 23.24 kg/m  08/01/21 23.26 kg/m  12/21/20 23.70 kg/m    Assessment/Interventions: Review of patient past medical history, allergies, medications, health status, including review of consultants reports, laboratory and other test data, was performed as part of comprehensive evaluation and provision of chronic care management services.   SDOH:  (Social Determinants of Health) assessments and interventions performed: Yes  SDOH Screenings   Alcohol Screen: Low Risk    Last Alcohol Screening Score (AUDIT): 0  Depression (PHQ2-9): Low Risk    PHQ-2 Score: 0  Financial Resource Strain: Low Risk    Difficulty of Paying Living Expenses: Not very hard  Food Insecurity: No Food Insecurity   Worried About Running Out of Food in the Last Year: Never true   Ran Out of Food in the Last Year: Never true  Housing: Low Risk    Last Housing Risk Score: 0  Physical Activity: Sufficiently Active   Days of Exercise per Week: 5 days   Minutes of Exercise per Session: 30 min  Social Connections: Moderately Integrated   Frequency of Communication with  Friends and Family: More than three times a week   Frequency of Social Gatherings with Friends and Family: Once a week   Attends Religious Services: 1 to 4 times per year   Active Member of Clubs or Organizations: No   Attends Club or Organization Meetings: 1 to 4 times per year   Marital Status: Widowed  Stress: No Stress Concern Present   Feeling of Stress : Not at all  Tobacco Use: Low Risk      Smoking Tobacco Use: Never   Smokeless Tobacco Use: Never  Transportation Needs: No Transportation Needs   Lack of Transportation (Medical): No   Lack of Transportation (Non-Medical): No    CCM Care Plan  Allergies  Allergen Reactions   Darvon [Propoxyphene]     Darvocet-Makes her "crazy"   Morphine And Related     Makes her "crazy"   Percocet [Oxycodone-Acetaminophen]     Makes her "crazy"   Vicodin [Hydrocodone-Acetaminophen]     Makes her "crazy"    Medications Reviewed Today     Reviewed by Foltanski, Lindsey N, RPH (Pharmacist) on 08/24/21 at 0929  Med List Status: <None>   Medication Order Taking? Sig Documenting Provider Last Dose Status Informant  acetaminophen (TYLENOL) 500 MG tablet [id number] Yes Take 1,000 mg by mouth every 6 (six) hours as needed for moderate pain or headache.  [provider] Taking Active Self  BIOTIN PO [id number] Yes Take 1,000 mcg by mouth daily.  [provider] Taking Active Self  brimonidine (ALPHAGAN) 0.2 % ophthalmic solution [id number] Yes Place 1 drop into both eyes in the morning and at bedtime. [provider] Taking Active Self  Calcium Carb-Cholecalciferol (CALCIUM + D3) 600-200 MG-UNIT TABS [id number] Yes Take 1 tablet by mouth 2 (two) times daily.  [provider] Taking Active Self  Cholecalciferol (VITAMIN D3) 400 UNITS CAPS [id number] Yes Take 400 Units by mouth daily.  [provider] Taking Active Self  Cyanocobalamin (VITAMIN B 12 PO) [id number] Yes Take 500 mg by mouth daily. [provider] Taking Active Self  diclofenac Sodium (VOLTAREN) 1 % GEL [id number] Yes Apply 4 g topically 4 (four) times daily. Palumbo, April, MD Taking Active   dicyclomine (BENTYL) 10 MG capsule [id number] Yes Take 1 capsule (10 mg total) by mouth 2 (two) times daily as needed for spasms. Nandigam, Kavitha V, MD Taking Active   dorzolamide-timolol (COSOPT) 22.3-6.8 MG/ML ophthalmic solution [id number] Yes Place 1 drop into both eyes 2 (two) times daily.  [provider] Taking Active Self           Med Note (FISHER, KYLE A   Tue Mar 20, 2016  4:50 PM)    DULoxetine (CYMBALTA) 20 MG capsule [id number] Yes TAKE 1 CAPSULE EVERY DAY Corey, Evan S, MD Taking Active   latanoprost (XALATAN) 0.005 % ophthalmic solution [id number] Yes Place 1 drop into both eyes at bedtime.  [provider] Taking Active Self  lidocaine (LIDODERM) 5 % [id number] Yes Place 1 patch onto the skin daily. Remove & Discard patch within 12 hours or as directed by MD Palumbo, April, MD Taking Active   linaclotide (LINZESS) 145 MCG CAPS capsule [id number] Yes Take 1 capsule (145 mcg total) by mouth as needed. Nandigam, Kavitha V, MD Taking Active   meclizine (ANTIVERT) 12.5 MG tablet [id number] Yes Take 12.5 mg by mouth 3 (three) times daily as needed for dizziness.  [provider] Taking Active Self  Multiple Vitamin (ONE-A-DAY ESSENTIAL PO) [id number] Yes Take by mouth daily. [provider] Taking Active Self  Omega-3 1000 MG CAPS [id number] Yes Take 1 capsule by mouth daily.  [provider] Taking Active Self  omeprazole (PRILOSEC) 40 MG capsule [id number] Yes TAKE 1 CAPSULE TWICE DAILY (DOSE CHANGE) Nandigam, Kavitha V, MD Taking Active   polyethylene glycol (MIRALAX / GLYCOLAX) 17 g packet [id number] Yes Take 17 g by mouth as needed. [provider] Taking Active   rosuvastatin (CRESTOR) 10 MG tablet [id number] Yes TAKE 1   TABLET EVERY DAY Hoyt Koch, MD Taking Active    Turmeric 500 MG TABS [id number] Yes Take 1 tablet by mouth daily.  [provider] Taking Active Self            Patient Active Problem List   Diagnosis Date Noted   RUQ pain 02/23/2020   Dysphagia 08/19/2019   Primary osteoarthritis of right knee 12/26/2018   Cough 12/19/2018   Constipation 12/19/2018   Rotator cuff arthropathy of right shoulder 04/02/2018   GERD (gastroesophageal reflux disease) 12/13/2017   History of breast cancer 01/02/2017   Compression fracture of L3 lumbar vertebra 05/04/2016   Hyperlipidemia 08/16/2015   Left carotid bruit 08/16/2015   Atherosclerotic peripheral vascular disease ([place]) 08/06/2015   Routine general medical examination at a health care facility 12/09/2014   Osteopenia 12/09/2014   Glaucoma 12/09/2014    Immunization History  Administered Date(s) Administered   Influenza Whole 09/30/2014   Influenza, High Dose Seasonal PF 09/14/2019   Influenza-Unspecified 09/30/2015, 09/13/2017, 09/19/2018, 09/14/2020   PFIZER(Purple Top)SARS-COV-2 Vaccination 03/15/2020, 04/05/2020, 11/01/2020   Pneumococcal Conjugate-13 09/30/2015   Pneumococcal Polysaccharide-23 12/10/2016   Tdap 12/09/2015   Zoster, Live 01/01/2012    Conditions to be addressed/monitored:  Hyperlipidemia, GERD, Chronic Kidney Disease, and Osteoarthritis, IBS-C  Care Plan : [place]  Updates made by Charlton Haws, [place] since 08/24/2021 12:00 AM     Problem: Hyperlipidemia, PVD, GERD, Chronic Kidney Disease, and Osteoarthritis, IBS-C   Priority: High     Long-Range Goal: Disease management   Start Date: 08/24/2021  Expected End Date: 08/24/2022  This Visit's Progress: On track  Priority: High  Note:   Current Barriers:  Unable to independently monitor therapeutic efficacy  Pharmacist Clinical Goal(s):  Patient will achieve adherence to monitoring guidelines and medication adherence to achieve therapeutic efficacy through collaboration with  PharmD and provider.   Interventions: 1:1 collaboration with Hoyt Koch, MD regarding development and update of comprehensive plan of care as evidenced by provider attestation and co-signature Inter-disciplinary care team collaboration (see longitudinal plan of care) Comprehensive medication review performed; medication list updated in electronic medical record  Hyperlipidemia / PVD    LDL goal < 70 Atherosclerotic PVD  Patient has failed these meds in past: n/a Patient is currently controlled on the following medications:  Rosuvastatin 10 mg daily OTC Omega-3 1000 mg daily   We discussed:  LDL is at goal; pt reports compliance with statin and denies side effects   Plan: Continue current medications   GERD / IBS-C    Patient has failed these meds in past: n/a Patient is currently controlled on the following medications:  Omeprazole 40 mg BID Dicyclomine 10 mg BID prn Miralax PRN Linzess 145 mcg (samples)   We discussed:  Pt reports Linzess is "very very helpful" but her insurance does not cover it; assessed pt assistance program and unfortunately pt will not qualify because it requires significant OOP spend on medications; she does ok with PRN Miralax and prune juice  Plan: Continue current medications   Chronic pain    Osteoarthritis of knee; Lumbar compression fracture Follows w/ Dr Georgina Snell   Patient has failed these meds in past: duloxetine Patient is currently controlled on the following medications:  Tylenol 500 mg PRN Turmeric 500 mg daily Duloxetine 20 mg daily HS Voltaren Gel PRN   We discussed: Pt had injection for trigger finger earlier this week and reports some improvement; she is interested in hemp/CBD topicals for arthritis  pain; advised these are reasonable options but tend to be more expensive than Voltaren, Aspercreme etc.   Plan: Continue current medications; can try hemp/CBD topicals if needed   Glaucoma    Patient has failed these meds  in past: n/a Patient is currently controlled on the following medications:  Dorzolamide-Timolol (Cosopt) eye drops BID Latanoprost 0.005% eye drops HS Brimonidine 0.2% eye drops Vyzulta eye drops (samples)   We discussed:  Pt reports new eye drop (Vyzulta) is very expensive, Pt was given samples so she does not have to pay out of pocket for it and reports eye doctor will continue to provide samples   Plan: Continue current medications     Patient Goals/Self-Care Activities Patient will:  - take medications as prescribed focus on medication adherence by routine collaborate with provider on medication access solutions (Linzess, Vyzulta samples)      Medication Assistance: None required.  Patient affirms current coverage meets needs.  Compliance/Adherence/Medication fill history: Care Gaps: Vaccines - Shingrix, covid booster --pt reports she had Covid booster in April 2022, and Shingrix vaccine is $200 with her insurance so she is waiting on that  Star-Rating Drugs: Rosuvastatin - LF 05/30/21 x 90 ds  Patient's preferred pharmacy is:  [facility], [address] [place] [address] Phone: [phone] Fax: [phone]  [facility] - [place], [address] [place] [address] Phone: [phone] Fax: [phone]  Uses pill box? No - prefers bottles Pt endorses 100% compliance  We discussed: Current pharmacy is preferred with insurance plan and patient is satisfied with pharmacy services Patient decided to: Continue current medication management strategy  Care Plan and Follow Up Patient Decision:  Patient agrees to Care Plan and Follow-up.  Plan: Telephone follow up appointment with care management team member scheduled for:  6 months  Charlene Brooke, PharmD, [place], CPP Clinical Pharmacist [facility] [phone]

## 2021-08-24 NOTE — Patient Instructions (Signed)
Visit Information  Phone number for Pharmacist: [phone]   Goals Addressed             This Visit's Progress    Manage My Medicine       Timeframe:  Long-Range Goal Priority:  Medium Start Date:       08/24/21                      Expected End Date:      08/24/22                 Follow Up Date Feb 2023   - call for medicine refill 2 or 3 days before it runs out - call if I am sick and can't take my medicine - keep a list of all the medicines I take; vitamins and herbals too  -collaborate with provider on medication access solutions (Linzess, Vyzulta samples)   Why is this important?   These steps will help you keep on track with your medicines.   Notes:         Care Plan : [place]  Updates made by Charlton Haws, RPH since 08/24/2021 12:00 AM     Problem: Hyperlipidemia, PVD, GERD, Chronic Kidney Disease, and Osteoarthritis, IBS-C   Priority: High     Long-Range Goal: Disease management   Start Date: 08/24/2021  Expected End Date: 08/24/2022  This Visit's Progress: On track  Priority: High  Note:   Current Barriers:  Unable to independently monitor therapeutic efficacy  Pharmacist Clinical Goal(s):  Patient will achieve adherence to monitoring guidelines and medication adherence to achieve therapeutic efficacy through collaboration with PharmD and provider.   Interventions: 1:1 collaboration with Hoyt Koch, MD regarding development and update of comprehensive plan of care as evidenced by provider attestation and co-signature Inter-disciplinary care team collaboration (see longitudinal plan of care) Comprehensive medication review performed; medication list updated in electronic medical record  Hyperlipidemia / PVD    LDL goal < 70 Atherosclerotic PVD  Patient has failed these meds in past: n/a Patient is currently controlled on the following medications:  Rosuvastatin 10 mg daily OTC Omega-3 1000 mg daily   We  discussed:  LDL is at goal; pt reports compliance with statin and denies side effects   Plan: Continue current medications   GERD / IBS-C    Patient has failed these meds in past: n/a Patient is currently controlled on the following medications:  Omeprazole 40 mg BID Dicyclomine 10 mg BID prn Miralax PRN Linzess 145 mcg (samples)   We discussed:  Pt reports Linzess is "very very helpful" but her insurance does not cover it; assessed pt assistance program and unfortunately pt will not qualify because it requires significant OOP spend on medications; she does ok with PRN Miralax and prune juice  Plan: Continue current medications   Chronic pain    Osteoarthritis of knee; Lumbar compression fracture Follows w/ Dr Georgina Snell   Patient has failed these meds in past: duloxetine Patient is currently controlled on the following medications:  Tylenol 500 mg PRN Turmeric 500 mg daily Duloxetine 20 mg daily HS Voltaren Gel PRN   We discussed: Pt had injection for trigger finger earlier this week and reports some improvement; she is interested in hemp/CBD topicals for arthritis pain; advised these are reasonable options but tend to be more expensive than Voltaren, Aspercreme etc.   Plan: Continue current medications; can try hemp/CBD topicals if needed   Glaucoma  Patient has failed these meds in past: n/a Patient is currently controlled on the following medications:  Dorzolamide-Timolol (Cosopt) eye drops BID Latanoprost 0.005% eye drops HS Brimonidine 0.2% eye drops Vyzulta eye drops (samples)   We discussed:  Pt reports new eye drop (Vyzulta) is very expensive, Pt was given samples so she does not have to pay out of pocket for it and reports eye doctor will continue to provide samples   Plan: Continue current medications     Patient Goals/Self-Care Activities Patient will:  - take medications as prescribed focus on medication adherence by routine collaborate with provider on  medication access solutions (Linzess, Vyzulta samples)      The patient verbalized understanding of instructions, educational materials, and care plan provided today and declined offer to receive copy of patient instructions, educational materials, and care plan.  Telephone follow up appointment with pharmacy team member scheduled for: 6 months  Charlene Brooke, PharmD, [place], CPP Clinical Pharmacist [facility] [phone]

## 2021-09-18 DIAGNOSIS — Z23 Encounter for immunization: Secondary | ICD-10-CM | POA: Diagnosis not present

## 2021-09-25 NOTE — Progress Notes (Signed)
I, Wendy Poet, LAT, ATC, am serving as scribe for Dr. Lynne Leader.  Wanda Rivera is a 80 y.o. female who presents to [place] at [facility] today for f/u or R thumb and R lower quadrant/R thigh pain.  She was last seen by Dr. Georgina Snell on 08/22/21 and had a R trigger thumb injection.  Today, pt reports that her R thumb is feeling better and notes that the injection helped w/ her pain and mobility.  She reports that it is no longer snapping.  She states that her L thumb is now intermittently triggering.  She is having some R lateral knee pain today after suffering a fall about 3 years ago.  She does wear a knee sleeve/brace.  Her main concern is her R lower quadrant abdominal pain.  Her pain con't to come and go.  Today, her pain in her R lower abdomen is not too bad. Gastroenterology is concerned that her abdominal pain is musculoskeletal related.  Diagnostic imaging: R hip and L-spine XR- 08/22/21  Pertinent review of systems: No fevers or chills  Relevant historical information: History of breast cancer History lumbar compression fracture at L3.   Exam:  BP 120/60 (BP Location: Right Arm, Patient Position: Sitting, Cuff Size: Normal)   Pulse (!) 38   Ht 5\' 4"  (1.626 m)   Wt 135 lb 12.8 oz (61.6 kg)   SpO2 (!) 78%   BMI 23.31 kg/m  General: Well Developed, well nourished, and in no acute distress.   MSK: Abdomen nontender. T-spine nontender decreased thoracic motion. L-spine nontender decreased lumbar motion.    Lab and Radiology Results  X-ray images thoracic spine obtained today personally and independently interpreted. Decreased bone mineral density.  No acute compression fracture visible. Await formal radiology review  EXAM: LUMBAR SPINE - 2-3 VIEW   COMPARISON:  03/27/2016, CT abdomen pelvis 03/11/2020   FINDINGS: Normal lumbar lordosis. No acute fracture or listhesis of the lumbar spine. Remote compression deformity of L3 noted without  retropulsion with approximately 30% loss of height, stable since prior CT examination of 03/11/2020. 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 The paraspinal soft tissues are unremarkable.   IMPRESSION: No acute fracture or listhesis.   Stable compression deformity L3.     Electronically Signed   By: Fidela Salisbury M.D.   On: 08/22/2021 23:49 I, Lynne Leader, personally (independently) visualized and performed the interpretation of the images attached in this note.    Assessment and Plan: 80 y.o. female with abdominal pain/flank pain.  Patient already has had a pretty extensive work-up from this from an abdominal standpoint thought to be not intra-abdominal.  Certainly at this point it could be low thoracic or high lumbar radiculopathy.  She is already failed conservative management with PT.  Plan for MRI lumbar and thoracic spine.  This should help evaluate for radiculopathy and should evaluate the compression fracture seen at L3 as well.  Recheck after MRI.   PDMP not reviewed this encounter. Orders Placed This Encounter  Procedures   DG Thoracic Spine W/Swimmers    Standing Status:   Future    Number of Occurrences:   1    Standing Expiration Date:   09/26/2022    Order Specific Question:   Reason for Exam (SYMPTOM  OR DIAGNOSIS REQUIRED)    Answer:   back  pain    Order Specific Question:   Preferred imaging location?    Answer:   [facility]   MR Lumbar Spine Wo Contrast    Standing Status:   Future    Standing Expiration Date:   09/26/2022    Order Specific Question:   What is the patient's sedation requirement?    Answer:   No Sedation    Order Specific Question:   Does the patient have a pacemaker or implanted devices?    Answer:   No    Order Specific Question:   Preferred imaging  location?    Answer:   [facility] (table limit-550lbs)   MR THORACIC SPINE WO CONTRAST    Standing Status:   Future    Standing Expiration Date:   09/26/2022    Order Specific Question:   What is the patient's sedation requirement?    Answer:   No Sedation    Order Specific Question:   Does the patient have a pacemaker or implanted devices?    Answer:   No    Order Specific Question:   Preferred imaging location?    Answer:   [facility] (table limit-550lbs)   No orders of the defined types were placed in this encounter.    Discussed warning signs or symptoms. Please see discharge instructions. Patient expresses understanding.   The above documentation has been reviewed and is accurate and complete Lynne Leader, M.D.   Total encounter time 30 minutes including face-to-face time with the patient and, reviewing past medical record, and charting on the date of service.   Reviewed previous findings discussed treatment plan and options.

## 2021-09-26 ENCOUNTER — Ambulatory Visit (INDEPENDENT_AMBULATORY_CARE_PROVIDER_SITE_OTHER): Payer: Medicare Other | Admitting: Family Medicine

## 2021-09-26 ENCOUNTER — Other Ambulatory Visit: Payer: Self-pay

## 2021-09-26 ENCOUNTER — Ambulatory Visit (HOSPITAL_COMMUNITY)
Admission: RE | Admit: 2021-09-26 | Discharge: 2021-09-26 | Disposition: A | Discharge status: Home / Self Care | Payer: Medicare Other | Source: Ambulatory Visit | Attending: Family Medicine | Admitting: Family Medicine

## 2021-09-26 ENCOUNTER — Other Ambulatory Visit: Payer: Self-pay | Admitting: Family Medicine

## 2021-09-26 ENCOUNTER — Ambulatory Visit (INDEPENDENT_AMBULATORY_CARE_PROVIDER_SITE_OTHER): Payer: Medicare Other

## 2021-09-26 VITALS — BP 120/60 | HR 38 | Ht 64.0 in | Wt 135.8 lb

## 2021-09-26 DIAGNOSIS — M5414 Radiculopathy, thoracic region: Secondary | ICD-10-CM | POA: Diagnosis not present

## 2021-09-26 DIAGNOSIS — M5416 Radiculopathy, lumbar region: Secondary | ICD-10-CM | POA: Diagnosis not present

## 2021-09-26 DIAGNOSIS — M546 Pain in thoracic spine: Secondary | ICD-10-CM | POA: Diagnosis not present

## 2021-09-26 DIAGNOSIS — R109 Unspecified abdominal pain: Secondary | ICD-10-CM

## 2021-09-26 DIAGNOSIS — I70209 Unspecified atherosclerosis of native arteries of extremities, unspecified extremity: Secondary | ICD-10-CM | POA: Diagnosis not present

## 2021-09-26 NOTE — Patient Instructions (Addendum)
Thank you for coming in today.   Please get an Xray today before you leave   Proceed to MRI. Please call [phone] to schedule.  Recheck back after MRI results come back.

## 2021-09-28 DIAGNOSIS — H401123 Primary open-angle glaucoma, left eye, severe stage: Secondary | ICD-10-CM | POA: Diagnosis not present

## 2021-09-28 NOTE — Progress Notes (Signed)
X-ray thoracic spine shows a chronic L3 compression fracture which is stable.  MRI should be more helpful.

## 2021-10-03 DIAGNOSIS — H401111 Primary open-angle glaucoma, right eye, mild stage: Secondary | ICD-10-CM | POA: Diagnosis not present

## 2021-10-03 DIAGNOSIS — H401123 Primary open-angle glaucoma, left eye, severe stage: Secondary | ICD-10-CM | POA: Diagnosis not present

## 2021-10-09 ENCOUNTER — Ambulatory Visit
Admission: RE | Admit: 2021-10-09 | Discharge: 2021-10-09 | Disposition: A | Discharge status: Home / Self Care | Payer: Medicare Other | Source: Ambulatory Visit | Attending: Family Medicine | Admitting: Family Medicine

## 2021-10-09 DIAGNOSIS — M5414 Radiculopathy, thoracic region: Secondary | ICD-10-CM

## 2021-10-09 DIAGNOSIS — M545 Low back pain, unspecified: Secondary | ICD-10-CM | POA: Diagnosis not present

## 2021-10-09 DIAGNOSIS — M5416 Radiculopathy, lumbar region: Secondary | ICD-10-CM

## 2021-10-09 DIAGNOSIS — G8929 Other chronic pain: Secondary | ICD-10-CM | POA: Diagnosis not present

## 2021-10-09 DIAGNOSIS — M48061 Spinal stenosis, lumbar region without neurogenic claudication: Secondary | ICD-10-CM | POA: Diagnosis not present

## 2021-10-09 DIAGNOSIS — M5134 Other intervertebral disc degeneration, thoracic region: Secondary | ICD-10-CM | POA: Diagnosis not present

## 2021-10-09 DIAGNOSIS — D1800 Hemangioma unspecified site: Secondary | ICD-10-CM | POA: Diagnosis not present

## 2021-10-10 NOTE — Progress Notes (Signed)
I, Peterson Lombard, LAT, ATC acting as a scribe for Lynne Leader, MD.  Wanda Rivera is a 80 y.o. female who presents to [place] at [facility] today for f/u abdominal pain/flank pain thought to be due to low thoracic or high lumbar radiculopathy and MRI review. Pt was last seen by Dr. Georgina Snell on 09/26/21 and was advised to proceed to MRI. Today, pt reports her pain is about the same. Pt notes she is very stressed over the results of the MRI and is dealing w/ issues related to her glaucoma.  Dx imaging: 10/09/21 T-spine & L-spine MRI  09/26/21 T-spine XR  08/22/21 R hip & L-spine XR  Pertinent review of systems: No fevers or chills  Relevant historical information: History of breast cancer   Exam:  BP 122/78   Pulse (!) 51   Ht 5\' 4"  (1.626 m)   Wt 136 lb 9.6 oz (62 kg)   SpO2 94%   BMI 23.45 kg/m  General: Well Developed, well nourished, and in no acute distress.   MSK: T and L-spine nontender midline normal thoracic and lumbar motion.    Lab and Radiology Results No results found for this or any previous visit (from the past 72 hour(s)). MR THORACIC SPINE WO CONTRAST  Result Date: 10/10/2021 CLINICAL DATA:  Chronic back pain with right leg radiculopathy for 1.5 years EXAM: MRI THORACIC SPINE WITHOUT CONTRAST TECHNIQUE: Multiplanar, multisequence MR imaging of the thoracic spine was performed. No intravenous contrast was administered. COMPARISON:  None. FINDINGS: Alignment:  Physiologic. Vertebrae: No fracture, evidence of discitis, or bone lesion. T6 hemangioma. Cord:  Normal signal and morphology. Paraspinal and other soft tissues: Negative Disc levels: There is no spinal canal or neural foraminal stenosis. Mild multilevel degenerative disc disease without focal herniation. IMPRESSION: Mild multilevel degenerative disc disease without spinal canal or neural foraminal stenosis. Electronically Signed   By: Ulyses Jarred M.D.   On: 10/10/2021 23:57   MR Lumbar  Spine Wo Contrast  Result Date: 10/11/2021 CLINICAL DATA:  Chronic back pain with radiculopathy on the right for 1.5 years. EXAM: MRI LUMBAR SPINE WITHOUT CONTRAST TECHNIQUE: Multiplanar, multisequence MR imaging of the lumbar spine was performed. No intravenous contrast was administered. COMPARISON:  Lumbar spine CT 05/02/2016 FINDINGS: Segmentation:  5 lumbar type vertebrae Alignment:  Straightening of lumbar lordosis Vertebrae: No acute fracture, evidence of discitis, or bone lesion. Remote L3 compression fracture with healed moderate height loss. Conus medullaris and cauda equina: Conus extends to the L1-2 level. Conus and cauda equina appear normal. Paraspinal and other soft tissues: No evidence of perispinal mass or inflammation. Mild right hydronephrosis similar to 2017 CT. Disc levels: T12- L1: Mild disc bulging L1-L2: Mild disc bulging L2-L3: Mild disc narrowing with circumferential bulging L3-L4: Disc narrowing and bulging with bilateral inferior foraminal protrusion. Mild bilateral foraminal narrowing based on axial images L4-L5: Disc narrowing and bulging with bilateral inferior foraminal protrusion. No compressive stenosis L5-S1:Greatest level of degenerative disc narrowing with bulging and moderate right foraminal stenosis. Mild facet spurring. IMPRESSION: 1. 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 2. Widely patent spinal canal. 3. Remote L3 compression fracture. Electronically Signed   By: Jorje Guild M.D.   On: 10/11/2021 05:10    I, Lynne Leader, personally (independently) visualized and performed the interpretation of the images attached in this note.     Assessment and Plan: 80 y.o. female with right upper quadrant abdominal pain.  Patient  has had extensive trials of treatment and work-up thus far from a MSK standpoint.  At this point I do not believe that her pain is due to MSK etiology.  I think most likely  explanation is probably a functional bowel disorder such as IBS or similar.  She has had a pretty extensive work-up already from gastroenterology including upper abdominal ultrasound, CT scan, HIDA scan, upper GI endoscopy  (in 2018).  At this point I will send a message to both her PCP and her gastroenterologist to try to start a conversation about next steps. Total encounter time 30 minutes including face-to-face time with the patient and, reviewing past medical record, and charting on the date of service.   Reviewed MRI findings discussed treatment plan and options and communicated with treatment team.   Discussed warning signs or symptoms. Please see discharge instructions. Patient expresses understanding.   The above documentation has been reviewed and is accurate and complete Lynne Leader, M.D.

## 2021-10-11 ENCOUNTER — Other Ambulatory Visit: Payer: Self-pay

## 2021-10-11 ENCOUNTER — Ambulatory Visit (INDEPENDENT_AMBULATORY_CARE_PROVIDER_SITE_OTHER): Payer: Medicare Other | Admitting: Family Medicine

## 2021-10-11 VITALS — BP 122/78 | HR 51 | Ht 64.0 in | Wt 136.6 lb

## 2021-10-11 DIAGNOSIS — R109 Unspecified abdominal pain: Secondary | ICD-10-CM | POA: Diagnosis not present

## 2021-10-11 DIAGNOSIS — I70209 Unspecified atherosclerosis of native arteries of extremities, unspecified extremity: Secondary | ICD-10-CM | POA: Diagnosis not present

## 2021-10-11 NOTE — Progress Notes (Signed)
MRI lumbar spine does show remote compression fracture at L3.  This is old.  In addition to the potential of a pinched nerve at the very bottom of her spine that could cause some right leg pain but not right flank pain.  We will talk about this more during your follow-up visit with me today.

## 2021-10-11 NOTE — Progress Notes (Signed)
MRI thoracic spine does show arthritis changes but does not show a pinched nerve to explain your flank pain.  We will talk about this more during your follow-up visit today

## 2021-10-11 NOTE — Patient Instructions (Addendum)
Thank you for coming in today.   I think you probably have a functional bowel problem like IBS.   I will try to talk with your other doctors about your pain.   Let me know if you need anything.

## 2021-10-16 ENCOUNTER — Other Ambulatory Visit: Payer: Self-pay | Admitting: Internal Medicine

## 2021-10-24 DIAGNOSIS — H401123 Primary open-angle glaucoma, left eye, severe stage: Secondary | ICD-10-CM | POA: Diagnosis not present

## 2021-10-24 DIAGNOSIS — Z01818 Encounter for other preprocedural examination: Secondary | ICD-10-CM | POA: Diagnosis not present

## 2021-10-30 DIAGNOSIS — H401123 Primary open-angle glaucoma, left eye, severe stage: Secondary | ICD-10-CM | POA: Diagnosis not present

## 2021-12-28 ENCOUNTER — Encounter: Payer: Self-pay | Admitting: Internal Medicine

## 2021-12-28 ENCOUNTER — Other Ambulatory Visit: Payer: Self-pay

## 2021-12-28 ENCOUNTER — Telehealth: Payer: Self-pay

## 2021-12-28 ENCOUNTER — Ambulatory Visit (INDEPENDENT_AMBULATORY_CARE_PROVIDER_SITE_OTHER): Payer: Medicare Other | Admitting: Internal Medicine

## 2021-12-28 VITALS — BP 124/70 | HR 74 | Resp 18 | Ht 64.0 in | Wt 134.2 lb

## 2021-12-28 DIAGNOSIS — K5904 Chronic idiopathic constipation: Secondary | ICD-10-CM | POA: Diagnosis not present

## 2021-12-28 DIAGNOSIS — K219 Gastro-esophageal reflux disease without esophagitis: Secondary | ICD-10-CM

## 2021-12-28 DIAGNOSIS — E782 Mixed hyperlipidemia: Secondary | ICD-10-CM

## 2021-12-28 DIAGNOSIS — R1011 Right upper quadrant pain: Secondary | ICD-10-CM

## 2021-12-28 DIAGNOSIS — I70209 Unspecified atherosclerosis of native arteries of extremities, unspecified extremity: Secondary | ICD-10-CM | POA: Diagnosis not present

## 2021-12-28 DIAGNOSIS — R200 Anesthesia of skin: Secondary | ICD-10-CM | POA: Diagnosis not present

## 2021-12-28 DIAGNOSIS — Z853 Personal history of malignant neoplasm of breast: Secondary | ICD-10-CM

## 2021-12-28 DIAGNOSIS — R7301 Impaired fasting glucose: Secondary | ICD-10-CM | POA: Diagnosis not present

## 2021-12-28 DIAGNOSIS — E559 Vitamin D deficiency, unspecified: Secondary | ICD-10-CM | POA: Diagnosis not present

## 2021-12-28 LAB — COMPREHENSIVE METABOLIC PANEL
ALT: 13 U/L (ref 0–35)
AST: 22 U/L (ref 0–37)
Albumin: 4.2 g/dL (ref 3.5–5.2)
Alkaline Phosphatase: 76 U/L (ref 39–117)
BUN: 19 mg/dL (ref 6–23)
CO2: 32 mEq/L (ref 19–32)
Calcium: 9.6 mg/dL (ref 8.4–10.5)
Chloride: 105 mEq/L (ref 96–112)
Creatinine, Ser: 1 mg/dL (ref 0.40–1.20)
GFR: 53.66 mL/min — ABNORMAL LOW (ref >60.00)
Glucose, Bld: 83 mg/dL (ref 70–99)
Potassium: 4.5 mEq/L (ref 3.5–5.1)
Sodium: 143 mEq/L (ref 135–145)
Total Bilirubin: 0.5 mg/dL (ref 0.2–1.2)
Total Protein: 7.2 g/dL (ref 6.0–8.3)

## 2021-12-28 LAB — CBC
HCT: 42.5 % (ref 36.0–46.0)
Hemoglobin: 14 g/dL (ref 12.0–15.0)
MCHC: 33.1 g/dL (ref 30.0–36.0)
MCV: 95.7 fl (ref 78.0–100.0)
Platelets: 181 10*3/uL (ref 150.0–400.0)
RBC: 4.44 Mil/uL (ref 3.87–5.11)
RDW: 13.7 % (ref 11.5–15.5)
WBC: 5.7 10*3/uL (ref 4.0–10.5)

## 2021-12-28 LAB — LIPID PANEL
Cholesterol: 162 mg/dL (ref 0–200)
HDL: 52.6 mg/dL (ref >39.00)
LDL Cholesterol: 94 mg/dL (ref 0–99)
NonHDL: 109.09
Total CHOL/HDL Ratio: 3
Triglycerides: 74 mg/dL (ref 0.0–149.0)
VLDL: 14.8 mg/dL (ref 0.0–40.0)

## 2021-12-28 LAB — VITAMIN D 25 HYDROXY (VIT D DEFICIENCY, FRACTURES): VITD: 106.03 ng/mL (ref 30.00–100.00)

## 2021-12-28 LAB — VITAMIN B12: Vitamin B-12: 1295 pg/mL — ABNORMAL HIGH (ref 211–911)

## 2021-12-28 LAB — TSH: TSH: 1.93 u[IU]/mL (ref 0.35–5.50)

## 2021-12-28 LAB — HEMOGLOBIN A1C: Hgb A1c MFr Bld: 5.7 % (ref 4.6–6.5)

## 2021-12-28 NOTE — Progress Notes (Signed)
° °  Subjective:   Patient ID: Wanda Rivera, female    DOB: August 10, 1941, 80 y.o.   MRN: [id number]  HPI The patient is a 80 YO female coming in for follow up. Still having similar concerns.   Review of Systems  Constitutional: Negative.   HENT: Negative.    Eyes: Negative.   Respiratory:  Negative for cough, chest tightness and shortness of breath.   Cardiovascular:  Negative for chest pain, palpitations and leg swelling.  Gastrointestinal:  Positive for abdominal pain. Negative for abdominal distention, constipation, diarrhea, nausea and vomiting.  Musculoskeletal:  Positive for arthralgias.  Skin: Negative.   Neurological: Negative.   Psychiatric/Behavioral: Negative.     Objective:  Physical Exam Constitutional:      Appearance: She is well-developed.  HENT:     Head: Normocephalic and atraumatic.  Cardiovascular:     Rate and Rhythm: Normal rate and regular rhythm.  Pulmonary:     Effort: Pulmonary effort is normal. No respiratory distress.     Breath sounds: Normal breath sounds. No wheezing or rales.  Abdominal:     General: Bowel sounds are normal. There is no distension.     Palpations: Abdomen is soft.     Tenderness: There is abdominal tenderness. There is no rebound.     Comments: Intermittent tenderness right flank/abdomen.  Musculoskeletal:     Cervical back: Normal range of motion.  Skin:    General: Skin is warm and dry.  Neurological:     Mental Status: She is alert and oriented to person, place, and time.     Coordination: Coordination normal.    Vitals:   12/28/21 0900  BP: 124/70  Pulse: 74  Resp: 18  SpO2: 94%  Weight: 134 lb 3.2 oz (60.9 kg)  Height: 5\' 4"  (1.626 m)    This visit occurred during the SARS-CoV-2 public health emergency.  Safety protocols were in place, including screening questions prior to the visit, additional usage of staff PPE, and extensive cleaning of exam room while observing appropriate contact time as indicated for  disinfecting solutions.   Assessment & Plan:

## 2021-12-28 NOTE — Patient Instructions (Addendum)
Try a carpal tunnel brace at night time to see if this helps.  We are checking the labs today.  Talk to the pharmacist about the shingles vaccine in 2023 to see if this is covered.

## 2021-12-28 NOTE — Telephone Encounter (Signed)
CRITICAL VALUE STICKER  CRITICAL VALUE: Vitamin D 106.03  RECEIVER (on-site recipient of call): [place], [place]  DATE & TIME NOTIFIED: 12/28/2021 @ 11:59 am  MESSENGER (representative from lab): Rushie Chestnut  MD NOTIFIED: Sharlet Salina  TIME OF NOTIFICATION: 12:02 pm   RESPONSE:

## 2021-12-29 ENCOUNTER — Encounter: Payer: Self-pay | Admitting: Internal Medicine

## 2021-12-29 DIAGNOSIS — R200 Anesthesia of skin: Secondary | ICD-10-CM | POA: Insufficient documentation

## 2021-12-29 NOTE — Assessment & Plan Note (Signed)
Cannot afford linzess so she is doing miralax and prune juice.

## 2021-12-29 NOTE — Telephone Encounter (Signed)
Will address via result notes.

## 2021-12-29 NOTE — Assessment & Plan Note (Signed)
Checking HgA1c, B12, vitamin D, CBC, CMP to rule out metabolic etiology. Prior carpal tunnel and advised to try night time brace to see if this helps symptoms.

## 2021-12-29 NOTE — Assessment & Plan Note (Signed)
Overall stable at omeprazole 40 mg BID.

## 2021-12-29 NOTE — Assessment & Plan Note (Signed)
She has had MRI with sports medicine with minimal changes in thoracic and lumbar disease. They do not feel that this is musculoskeletal and feel it may be IBS related or functional bowel. She is seeing GI and she has not been able to identify any food triggers which is frustrating to her.

## 2021-12-29 NOTE — Assessment & Plan Note (Signed)
Checking lipid panel and adjust crestor 10 mg daily as needed. 

## 2021-12-29 NOTE — Assessment & Plan Note (Signed)
Taking crestor 10 mg daily and tolerating well.

## 2021-12-29 NOTE — Assessment & Plan Note (Signed)
No known recurrence. She does monitoring. No mammogram indicated as she had bilateral mastectomy.

## 2022-01-01 ENCOUNTER — Other Ambulatory Visit: Payer: Self-pay | Admitting: Gastroenterology

## 2022-01-16 ENCOUNTER — Ambulatory Visit (INDEPENDENT_AMBULATORY_CARE_PROVIDER_SITE_OTHER): Payer: Medicare Other

## 2022-01-16 ENCOUNTER — Other Ambulatory Visit: Payer: Self-pay

## 2022-01-16 DIAGNOSIS — Z0000 Encounter for general adult medical examination without abnormal findings: Secondary | ICD-10-CM

## 2022-01-16 NOTE — Progress Notes (Signed)
I connected with Wanda Rivera today by telephone and verified that I am speaking with the correct person using two identifiers. Location patient: home Location provider: work Persons participating in the virtual visit: patient, provider.   I discussed the limitations, risks, security and privacy concerns of performing an evaluation and management service by telephone and the availability of in person appointments. I also discussed with the patient that there may be a patient responsible charge related to this service. The patient expressed understanding and verbally consented to this telephonic visit.    Interactive audio and video telecommunications were attempted between this provider and patient, however failed, due to patient having technical difficulties OR patient did not have access to video capability.  We continued and completed visit with audio only.  Some vital signs may be absent or patient reported.   Time Spent with patient on telephone encounter: 40 minutes  Subjective:   Wanda Rivera is a 81 y.o. female who presents for Medicare Annual (Subsequent) preventive examination.  Review of Systems     Cardiac Risk Factors include: advanced age (>8men, >51 women);family history of premature cardiovascular disease;dyslipidemia     Objective:    There were no vitals filed for this visit. There is no height or weight on file to calculate BMI.  Advanced Directives 01/16/2022 01/02/2021 11/10/2020 05/04/2020 12/17/2018 02/21/2018 03/20/2016  Does Patient Have a Medical Advance Directive? Yes Yes No No Yes No No  Type of Advance Directive Living will;Healthcare Power of Attorney Living will;Healthcare Power of Council;Living will - -  Does patient want to make changes to medical advance directive? No - Patient declined No - Patient declined - - - - -  Copy of [place] in Chart? No - copy requested No - copy requested - - No - copy  requested - -  Would patient like information on creating a medical advance directive? - - No - Patient declined No - Patient declined - No - Patient declined -    Current Medications (verified) Outpatient Encounter Medications as of 01/16/2022  Medication Sig   acetaminophen (TYLENOL) 500 MG tablet Take 1,000 mg by mouth every 6 (six) hours as needed for moderate pain or headache.    BIOTIN PO Take 1,000 mcg by mouth daily.    brimonidine (ALPHAGAN) 0.2 % ophthalmic solution Place 1 drop into both eyes in the morning and at bedtime.   Calcium Carb-Cholecalciferol (CALCIUM + D3) 600-200 MG-UNIT TABS Take 1 tablet by mouth 2 (two) times daily.    Cholecalciferol (VITAMIN D3) 400 UNITS CAPS Take 400 Units by mouth daily.    Cyanocobalamin (VITAMIN B 12 PO) Take 500 mg by mouth daily.   diclofenac Sodium (VOLTAREN) 1 % GEL Apply 4 g topically 4 (four) times daily.   dicyclomine (BENTYL) 10 MG capsule Take 1 capsule (10 mg total) by mouth 2 (two) times daily as needed for spasms.   dorzolamide-timolol (COSOPT) 22.3-6.8 MG/ML ophthalmic solution Place 1 drop into both eyes 2 (two) times daily.    DULoxetine (CYMBALTA) 20 MG capsule TAKE 1 CAPSULE EVERY DAY   latanoprost (XALATAN) 0.005 % ophthalmic solution Place 1 drop into both eyes at bedtime.    lidocaine (LIDODERM) 5 % Place 1 patch onto the skin daily. Remove & Discard patch within 12 hours or as directed by MD   meclizine (ANTIVERT) 12.5 MG tablet Take 12.5 mg by mouth 3 (three) times daily as needed for dizziness.  Multiple Vitamin (ONE-A-DAY ESSENTIAL PO) Take by mouth daily.   Omega-3 1000 MG CAPS Take 1 capsule by mouth daily.    omeprazole (PRILOSEC) 40 MG capsule TAKE 1 CAPSULE TWICE DAILY   polyethylene glycol (MIRALAX / GLYCOLAX) 17 g packet Take 17 g by mouth as needed.   rosuvastatin (CRESTOR) 10 MG tablet TAKE 1 TABLET EVERY DAY   Turmeric 500 MG TABS Take 1 tablet by mouth daily.    No facility-administered encounter  medications on file as of 01/16/2022.    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,hands   Breast cancer ([place])    1982, recurrence in 1985   Cataract    Bil   Glaucoma    Bil   Osteoporosis    Past Surgical History:  Procedure Laterality Date   ABDOMINAL HYSTERECTOMY     BREAST SURGERY     double masectomy for breast cancer   eye surgery Bilateral 06/05/2017   for Glaucoma   Family History  Problem Relation Age of Onset   Cancer Mother    Breast cancer Mother    Liver cancer Mother    Diabetes Father    Hypertension Father    Hypertension Sister    Diabetes Brother    Heart disease Brother    Social History   Socioeconomic History   Marital status: Divorced    Spouse name: Not on file   Number of children: 1   Years of education: Not on file   Highest education level: Not on file  Occupational History   Occupation: retired  Tobacco Use   Smoking status: Never   Smokeless tobacco: Never  Vaping Use   Vaping Use: Never used  Substance and Sexual Activity   Alcohol use: Yes    Alcohol/week: 0.0 standard drinks    Comment: occasionally a glass of red wine   Drug use: No   Sexual activity: Never  Other Topics Concern   Not on file  Social History Narrative   Not on file   Social Determinants of Health   Financial Resource Strain: Low Risk    Difficulty of Paying Living Expenses: Not hard at all  Food Insecurity: No Food Insecurity   Worried About Charity fundraiser in the Last Year: Never true   [place] in the Last Year: Never true  Transportation Needs: No Transportation Needs   Lack of Transportation (Medical): No   Lack of Transportation (Non-Medical): No  Physical Activity: Sufficiently Active   Days of Exercise per Week: 7 days   Minutes of Exercise per  Session: 30 min  Stress: No Stress Concern Present   Feeling of Stress : Not at all  Social Connections: Moderately Integrated   Frequency of Communication with Friends and Family: More than three times a week   Frequency of Social Gatherings with Friends and Family: Once a week   Attends Religious Services: 1 to 4 times per year   Active Member of [employer] or Organizations: No   Attends Archivist Meetings: 1 to 4 times per year   Marital Status: Widowed    Tobacco Counseling Counseling given: Not Answered   Clinical Intake:  Pre-visit preparation completed: Yes  Pain : No/denies pain     Nutritional Risks: None Diabetes: No  How often do you need to have someone help you when you  read instructions, pamphlets, or other written materials from your doctor or pharmacy?: 1 - Never 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 no  Interpreter Needed?: No  Information entered by :: Lisette Abu, LPN   Activities of Daily Living In your present state of health, do you have any difficulty performing the following activities: 01/16/2022  Hearing? N  Vision? N  Difficulty concentrating or making decisions? N  Walking or climbing stairs? N  Dressing or bathing? N  Doing errands, shopping? N  Preparing Food and eating ? N  Using the Toilet? N  In the past six months, have you accidently leaked urine? N  Do you have problems with loss of bowel control? N  Managing your Medications? N  Managing your Finances? N  Housekeeping or managing your Housekeeping? N  Some recent data might be hidden    Patient Care Team: Hoyt Koch, MD as PCP - General (Internal Medicine) Adrian Prows, MD as Consulting Physician (Cardiology) Charlton Haws, [facility] as Pharmacist (Pharmacist) Associates, [facility] as Consulting Physician (Ophthalmology) Mauri Pole, MD as Consulting Physician  (Gastroenterology) Gregor Hams, MD as Consulting Physician (Sports Medicine) Bretta Bang, MD as Consulting Physician (Ophthalmology)  Indicate any recent Medical Services you may have received from other than [facility] providers in the past year (date may be approximate).     Assessment:   This is a routine wellness examination for Leilanni.  Hearing/Vision screen Hearing Screening - Comments:: Patient denied any hearing difficulty.   No hearing aids.  Vision Screening - Comments:: Patient wears corrective glasses/contacts.  Eye exam done annually by: [facility]  Dietary issues and exercise activities discussed: Current Exercise Habits: Home exercise routine, Type of exercise: Other - see comments (stair climbing & elliptical), Time (Minutes): 30, Frequency (Times/Week): 7, Weekly Exercise (Minutes/Week): 210, Intensity: Mild   Goals Addressed   None   Depression Screen PHQ 2/9 Scores 01/16/2022 01/02/2021 12/21/2020 12/21/2019 12/17/2018 02/20/2018 02/07/2017  PHQ - 2 Score 0 0 1 0 0 0 0  PHQ- 9 Score - - 6 - - - -    Fall Risk Fall Risk  01/16/2022 01/02/2021 12/21/2020 11/25/2019 12/17/2018  Falls in the past year? 0 1 1 0 0  Comment - - - Emmi Telephone Survey: data to providers prior to load -  Number falls in past yr: 0 0 0 - -  Injury with Fall? 0 1 1 - -  Risk for fall due to : No Fall Risks History of fall(s) History of fall(s) - -  Follow up Falls evaluation completed Falls evaluation completed Falls evaluation completed - -    FALL RISK PREVENTION PERTAINING TO THE HOME:  Any stairs in or around the home? Yes  If so, are there any without handrails? No  Home free of loose throw rugs in walkways, pet beds, electrical cords, etc? Yes  Adequate lighting in your home to reduce risk of falls? Yes   ASSISTIVE DEVICES UTILIZED TO PREVENT FALLS:  Life alert? No  Use of a cane, walker or w/c? No  Grab bars in the bathroom? No  Shower chair or bench in shower?  No  Elevated toilet seat or a handicapped toilet? No   TIMED UP AND GO:  Was the test performed? No .  Length of time to ambulate 10 feet: n/a sec.   Gait steady and fast without use of assistive device  Cognitive Function:  Normal cognitive status assessed by direct observation by this Nurse Health Advisor. No abnormalities found.   MMSE - Mini Mental State Exam 12/17/2018  Orientation to time 5  Orientation to Place 5  Registration 3  Attention/ Calculation 4  Recall 2  Language- name 2 objects 2  Language- repeat 1  Language- follow 3 step command 3  Language- read & follow direction 1  Write a sentence 1  Copy design 1  Total score 28        Immunizations Immunization History  Administered Date(s) Administered   Influenza Whole 09/30/2014   Influenza, High Dose Seasonal PF 09/14/2019, 09/18/2021   Influenza-Unspecified 09/30/2015, 09/13/2017, 09/19/2018, 09/14/2020   PFIZER(Purple Top)SARS-COV-2 Vaccination 03/15/2020, 04/05/2020, 11/01/2020   Pfizer Covid-19 Vaccine Bivalent Booster 63yrs & up 09/18/2021   Pneumococcal Conjugate-13 09/30/2015   Pneumococcal Polysaccharide-23 12/10/2016   Tdap 12/09/2015   Zoster, Live 01/01/2012    TDAP status: Up to date  Flu Vaccine status: Up to date  Pneumococcal vaccine status: Up to date  Covid-19 vaccine status: Completed vaccines  Qualifies for Shingles Vaccine? Yes   Zostavax completed Yes   Shingrix Completed?: No.    Education has been provided regarding the importance of this vaccine. Patient has been advised to call insurance company to determine out of pocket expense if they have not yet received this vaccine. Advised may also receive vaccine at local pharmacy or Health Dept. Verbalized acceptance and understanding.  Screening Tests Health Maintenance  Topic Date Due   Zoster Vaccines- Shingrix (1 of 2) Never done   TETANUS/TDAP  12/08/2025   Pneumonia Vaccine 25+ Years old  Completed   INFLUENZA VACCINE   Completed   DEXA SCAN  Completed   COVID-19 Vaccine  Completed   Hepatitis C Screening  Completed   HPV VACCINES  Aged Out    Health Maintenance  Health Maintenance Due  Topic Date Due   Zoster Vaccines- Shingrix (1 of 2) Never done    Colorectal cancer screening: No longer required.   Mammogram status: No longer required due to patient refusal.  Bone Density status: Completed 12/18/2018. Results reflect: Bone density results: OSTEOPOROSIS. Repeat every 2-3 years.  Lung Cancer Screening: (Low Dose CT Chest recommended if Age 62-80 years, 30 pack-year currently smoking OR have quit w/in 15years.) does not qualify.   Lung Cancer Screening Referral: no  Additional Screening:  Hepatitis C Screening: does not qualify; Completed no  Vision Screening: Recommended annual ophthalmology exams for early detection of glaucoma and other disorders of the eye. Is the patient up to date with their annual eye exam?  Yes  Who is the provider or what is the name of the office in which the patient attends annual eye exams? [facility] If pt is not established with a provider, would they like to be referred to a provider to establish care? No .   Dental Screening: Recommended annual dental exams for proper oral hygiene  Community Resource Referral / Chronic Care Management: CRR required this visit?  No   CCM required this visit?  No      Plan:     I have personally reviewed and noted the following in the patients chart:   Medical and social history Use of alcohol, tobacco or illicit drugs  Current medications and supplements including opioid prescriptions.  Functional ability and status Nutritional status Physical activity Advanced directives List of other physicians Hospitalizations, surgeries, and ER visits in previous 12 months Vitals Screenings to include cognitive,  depression, and falls Referrals and appointments  In addition, I have reviewed and discussed  with patient certain preventive protocols, quality metrics, and best practice recommendations. A written personalized care plan for preventive services as well as general preventive health recommendations were provided to patient.     Sheral Flow, LPN   03/16/4098   Nurse Notes:  Patient is cogitatively intact. There were no vitals filed for this visit. There is no height or weight on file to calculate BMI.

## 2022-02-05 ENCOUNTER — Telehealth: Payer: Self-pay

## 2022-02-05 NOTE — Chronic Care Management (AMB) (Signed)
Chronic Care Management Pharmacy Assistant   Name: Wanda Rivera  MRN: [id number] DOB: 05/16/1941   Reason for Encounter: Disease State-General    Recent office visits:  12/28/21 Hoyt Koch, MD-PCP (Mixed hyperlipidemia) No orders or med changes  Recent consult visits:  10/11/21 Gregor Hams, MD-Sports Medicine (Abdominal wall pain) No orders or med changes  09/26/21 Gregor Hams, MD-Sports Medicine (Thoracic radiculopathy) Orders: DG Thoracic Spine W/Swimmers, MR Lumbar Spine Wo Contrast, MR THORACIC SPINE [facility] visits:  None in previous 6 months  Medications: Outpatient Encounter Medications as of 02/05/2022  Medication Sig   acetaminophen (TYLENOL) 500 MG tablet Take 1,000 mg by mouth every 6 (six) hours as needed for moderate pain or headache.    BIOTIN PO Take 1,000 mcg by mouth daily.    brimonidine (ALPHAGAN) 0.2 % ophthalmic solution Place 1 drop into both eyes in the morning and at bedtime.   Calcium Carb-Cholecalciferol (CALCIUM + D3) 600-200 MG-UNIT TABS Take 1 tablet by mouth 2 (two) times daily.    Cholecalciferol (VITAMIN D3) 400 UNITS CAPS Take 400 Units by mouth daily.    Cyanocobalamin (VITAMIN B 12 PO) Take 500 mg by mouth daily.   diclofenac Sodium (VOLTAREN) 1 % GEL Apply 4 g topically 4 (four) times daily.   dicyclomine (BENTYL) 10 MG capsule Take 1 capsule (10 mg total) by mouth 2 (two) times daily as needed for spasms.   dorzolamide-timolol (COSOPT) 22.3-6.8 MG/ML ophthalmic solution Place 1 drop into both eyes 2 (two) times daily.    DULoxetine (CYMBALTA) 20 MG capsule TAKE 1 CAPSULE EVERY DAY   latanoprost (XALATAN) 0.005 % ophthalmic solution Place 1 drop into both eyes at bedtime.    lidocaine (LIDODERM) 5 % Place 1 patch onto the skin daily. Remove & Discard patch within 12 hours or as directed by MD   meclizine (ANTIVERT) 12.5 MG tablet Take 12.5 mg by mouth 3 (three) times daily as needed for dizziness.    Multiple  Vitamin (ONE-A-DAY ESSENTIAL PO) Take by mouth daily.   Omega-3 1000 MG CAPS Take 1 capsule by mouth daily.    omeprazole (PRILOSEC) 40 MG capsule TAKE 1 CAPSULE TWICE DAILY   polyethylene glycol (MIRALAX / GLYCOLAX) 17 g packet Take 17 g by mouth as needed.   rosuvastatin (CRESTOR) 10 MG tablet TAKE 1 TABLET EVERY DAY   Turmeric 500 MG TABS Take 1 tablet by mouth daily.    No facility-administered encounter medications on file as of 02/05/2022.   Have you had any problems recently with your health? Patient states that she is still having issue with fingers on both hands go numb. She states that she does not have any pain in her fingers they just feel numb.  Have you had any problems with your pharmacy?Patient states she does not have any problems with getting medications from the pharmacy, but she has been getting samples for zyzulta  at Dr. Tobe Sos glaucoma specialist at [facility] because it is to expensive . She also said that at one time she was on Linzess but it too was expensive  What issues or side effects are you having with your medications?Patient is not having any side effects from medications  What would you like me to pass along to [facility] for them to help you with? Patient states that she is doing well but would like to talk with Linna Hoff about getting assistance for the Linzess if she qualifies but will wait until  her appt with Linna Hoff on 04/23/22 at 9:45 am  What can we do to take care of you better? Patient states that she does not need anything at this time  Care Gaps: Colonoscopy-NA Diabetic Foot Exam-NA Mammogram-NA Ophthalmology-NA Dexa Scan - NA Annual Well Visit - 01/16/22 Micro albumin-NA Hemoglobin A1c- 12/28/21  Star Rating Drugs: Rosuvastatin 10 mg-last fill 10/22/21 90 ds  Ethelene Hal Clinical Pharmacist Assistant [phone]

## 2022-02-20 ENCOUNTER — Telehealth: Payer: Medicare Other

## 2022-03-05 DIAGNOSIS — Z23 Encounter for immunization: Secondary | ICD-10-CM | POA: Diagnosis not present

## 2022-04-13 ENCOUNTER — Other Ambulatory Visit: Payer: Self-pay | Admitting: Gastroenterology

## 2022-04-23 ENCOUNTER — Ambulatory Visit (INDEPENDENT_AMBULATORY_CARE_PROVIDER_SITE_OTHER): Payer: Medicare Other

## 2022-04-23 DIAGNOSIS — I70209 Unspecified atherosclerosis of native arteries of extremities, unspecified extremity: Secondary | ICD-10-CM

## 2022-04-23 DIAGNOSIS — E782 Mixed hyperlipidemia: Secondary | ICD-10-CM

## 2022-04-23 DIAGNOSIS — K219 Gastro-esophageal reflux disease without esophagitis: Secondary | ICD-10-CM

## 2022-04-23 NOTE — Progress Notes (Signed)
? ?Chronic Care Management ?Pharmacy Note ? ?04/23/2022 ?Name:  Wanda Rivera MRN:  [id number] DOB:  Oct 17, 1941 ? 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 ?Recommendations/Changes made from today's visit: ?-PAP application for linzess started - patient will come into office to complete forms  ?-Advised for patient to closely monitor diet, work to reduce fried / fatty foods to help reduce LDL - recheck lipid panel with next PCP visit, consider increase in dosage of rosuvastatin should LDL remain elevated from goal  ?-Advised for patient to stop vitamin D as last lab showed elevated Vitamin D levels - patient to continue calcium supplementation  ?-F/u in 6 months  ? ? ?Subjective: ?Wanda Rivera is an 81 y.o. year old female who is a primary patient of Hoyt Koch, MD.  The CCM team was consulted for assistance with disease management and care coordination needs.   ? ?Engaged with patient by telephone for follow up visit in response to provider referral for pharmacy case management and/or care coordination services.  ? ?Consent to Services:  ?The patient was given information about Chronic Care Management services, agreed to services, and gave verbal consent prior to initiation of services.  Please see initial visit note for detailed documentation.  ? ?Patient Care Team: ?Hoyt Koch, MD as PCP - General (Internal Medicine) ?Adrian Prows, MD as Consulting Physician (Cardiology) ?[facility] [employer]  as Consulting Physician (Ophthalmology) ?Mauri Pole, MD as Consulting Physician (Gastroenterology) ?Gregor Hams, MD as Consulting Physician (Sports Medicine) ?Bretta Bang, MD as Consulting Physician (Ophthalmology) ?Tomasa Blase, [facility] (Pharmacist) ? ? ?Patient lives alone in a 2 story townhouse. She has a son living in [place]. She lived in [place] for many years before moving to [facility]. 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 She describes herself as "clumsy" and has had a fall in the past in which she hurt her knee. She does have a cane that she sometimes uses around her home for balance.  ? ?Recent office visits: ?12/28/2021 - Dr. Sharlet Salina - no changes to medications - f/u in 6 months  ? ?Recent consult visits: ?10/11/2021 - Dr. Georgina Snell - pain not believed to be due to MSK pain  - likely more related to IBS - PCP and gastro updated  ? ?Hospital visits: ?None in previous 6 months ? ? ?Objective: ? ?Lab Results  ?Component Value Date  ? CREATININE 1.00 12/28/2021  ? BUN 19 12/28/2021  ? GFR 53.66 (L) 12/28/2021  ? GFRNONAA 48 (L) 05/03/2019  ? GFRAA 55 (L) 05/03/2019  ? NA 143 12/28/2021  ? K 4.5 12/28/2021  ? CALCIUM 9.6 12/28/2021  ? CO2 32 12/28/2021  ? GLUCOSE 83 12/28/2021  ? ? ?Lab Results  ?Component Value Date/Time  ? HGBA1C 5.7 12/28/2021 09:44 AM  ? HGBA1C 5.6 12/21/2020 10:36 AM  ? GFR 53.66 (L) 12/28/2021 09:44 AM  ? GFR 49.28 (L) 12/21/2020 10:36 AM  ?  ?Last diabetic Eye exam: No results  found for: HMDIABEYEEXA  ?Last diabetic Foot exam: No results found for: HMDIABFOOTEX  ? ?Lab Results  ?Component Value Date  ? CHOL 162 12/28/2021  ? HDL 52.60 12/28/2021  ? [place] 94 12/28/2021  ? TRIG 74.0 12/28/2021  ? CHOLHDL 3 12/28/2021  ? ? ? ?  Latest Ref Rng & Units 12/28/2021  ?  9:44 AM 12/21/2020  ? 10:36 AM 02/23/2020  ? 11:19 AM  ?Hepatic Function  ?Total Protein 6.0 - 8.3 g/dL 7.2   6.8   7.0    ?Albumin 3.5 - 5.2 g/dL 4.2   4.1   4.3    ?AST 0 - 37 U/L '22   21    19    ' ?ALT 0 - 35 U/L '13   14   15    ' ?Alk Phosphatase 39 - 117 U/L 76   109   89    ?Total Bilirubin 0.2 - 1.2 mg/dL 0.5   0.5   0.4    ? ? ?Lab Results  ?Component Value Date/Time  ? TSH 1.93 12/28/2021 09:44 AM  ? ? ? ?  Latest Ref Rng & Units 12/28/2021  ?  9:44 AM 12/21/2020  ? 10:36 AM 02/23/2020  ? 11:19 AM  ?CBC  ?WBC 4.0 - 10.5 K/uL 5.7   6.1   6.1    ?Hemoglobin 12.0 - 15.0 g/dL 14.0   13.5   13.4    ?Hematocrit 36.0 - 46.0 % 42.5   41.1   40.5    ?Platelets 150.0 - 400.0 K/uL 181.0   176.0   178.0    ? ? ?Lab Results  ?Component Value Date/Time  ? VD25OH 106.03 (HH) 12/28/2021 09:44 AM  ? ? ?Clinical ASCVD: Yes  ?The 10-year ASCVD risk score (Arnett DK, et al., 2019) is: 16.9% ?  Values used to calculate the score: ?    Age: 81 years ?    Sex: Female ?    Is Non-Hispanic African American: Yes ?    Diabetic: No ?    Tobacco smoker: No ?    Systolic Blood Pressure: 476 mmHg ?    Is BP treated: No ?    HDL Cholesterol: 52.6 mg/dL ?    Total Cholesterol: 162 mg/dL   ? ? ?  01/16/2022  ?  2:00 PM 01/02/2021  ? 10:12 AM 12/21/2020  ? 10:04 AM  ?Depression screen PHQ 2/9  ?Decreased Interest 0 0 0  ?Down, Depressed, Hopeless 0 0 1  ?PHQ - 2 Score 0 0 1  ?Altered sleeping   3  ?Tired, decreased energy   1  ?Change in appetite   1  ?Feeling bad or failure about yourself    0  ?Trouble concentrating   0  ?Moving slowly or fidgety/restless   0  ?Suicidal thoughts   0  ?PHQ-9 Score   6  ?Difficult doing work/chores   Not difficult at all  ?  ? ?Social History  ? ?Tobacco Use  ?Smoking Status Never  ?Smokeless Tobacco Never  ? ?BP Readings from Last 3 Encounters:  ?12/28/21 124/70  ?10/11/21 122/78  ?09/26/21 120/60  ? ?Pulse Readings from Last 3 Encounters:  ?12/28/21 74  ?10/11/21 (!) 51  ?09/26/21 (!) 38  ? ?Wt Readings from Last 3 Encounters:  ?12/28/21 134 lb 3.2 oz (60.9 kg)  ?10/11/21 136 lb 9.6 oz (62 kg)  ?09/26/21 135 lb 12.8 oz (61.6 kg)  ? ?BMI Readings from Last 3 Encounters:  ?12/28/21 23.04 kg/m?  ?  10/11/21  23.45 kg/m?  ?09/26/21 23.31 kg/m?  ? ? ?Assessment/Interventions: Review of patient past medical history, allergies, medications, health status, including review of consultants reports, laboratory and other test data, was performed as part of comprehensive evaluation and provision of chronic care management services.  ? ?SDOH:  (Social Determinants of Health) assessments and interventions performed: Yes ? ?SDOH Screenings  ? ?Alcohol Screen: Low Risk   ? Last Alcohol Screening Score (AUDIT): 0  ?Depression (PHQ2-9): Low Risk   ? PHQ-2 Score: 0  ?Financial Resource Strain: Low Risk   ? Difficulty of Paying Living Expenses: Not hard at all  ?Food Insecurity: No Food Insecurity  ? Worried About Charity fundraiser in the Last Year: Never true  ? Ran Out of Food in the Last Year: Never true  ?Housing: Low Risk   ? Last Housing Risk Score: 0  ?Physical Activity: Sufficiently Active  ? Days of Exercise per Week: 7 days  ? Minutes of Exercise per Session: 30 min  ?Social Connections: Moderately Integrated  ? Frequency of Communication with Friends and Family: More than three times a week  ? Frequency of Social Gatherings with Friends and Family: Once a week  ? Attends Religious Services: 1 to 4 times per year  ? Active Member of Clubs or Organizations: No  ? Attends Archivist Meetings: 1 to 4 times per year  ? Marital Status: Widowed  ?Stress: No Stress Concern Present  ? Feeling of Stress : Not at all  ?Tobacco Use: Low Risk   ? Smoking Tobacco Use: Never  ? Smokeless Tobacco Use: Never  ? Passive Exposure: Not on file  ?Transportation Needs: No Transportation Needs  ? Lack of Transportation (Medical): No  ? Lack of Transportation (Non-Medical): No  ? ? ?CCM Care Plan ? ?Allergies  ?Allergen Reactions  ? Darvon [Propoxyphene]   ?  Darvocet-Makes her "crazy"  ? Morphine And Related   ?  Makes her "crazy"  ? Percocet [Oxycodone-Acetaminophen]   ?  Makes her "crazy"  ? Vicodin [Hydrocodone-Acetaminophen]   ?   Makes her "crazy"  ? ? ?Medications Reviewed Today   ? ? Reviewed by Sheral Flow, LPN (Licensed Practical Nurse) on 01/16/22 at 1348  Med List Status: <None>  ? ?Medication Order Taking? Sig Docum

## 2022-04-23 NOTE — Patient Instructions (Signed)
Visit Information ? ?Following are the goals we discussed today:  ? ?Manage My Medicine  ? ?Timeframe:  Long-Range Goal ?Priority:  Medium ?Start Date:       08/24/21                      ?Expected End Date:      04/24/2023              ? ?Follow Up Date 09/2022 ?  ?- call for medicine refill 2 or 3 days before it runs out ?- call if I am sick and can't take my medicine ?- keep a list of all the medicines I take; vitamins and herbals too  ?-collaborate with provider on medication access solutions (Linzess) ?  ?Why is this important?   ?These steps will help you keep on track with your medicines. ? ?Plan: Telephone follow up appointment with care management team member scheduled for:  6 months ?The patient has been provided with contact information for the care management team and has been advised to call with any health related questions or concerns.  ? ?Tomasa Blase, PharmD ?Clinical Pharmacist, [place]  ? ?Please call the care guide team at [phone] if you need to cancel or reschedule your appointment.  ? ?The patient verbalized understanding of instructions, educational materials, and care plan provided today and declined offer to receive copy of patient instructions, educational materials, and care plan.  ? ?

## 2022-04-29 DIAGNOSIS — E782 Mixed hyperlipidemia: Secondary | ICD-10-CM

## 2022-05-22 DIAGNOSIS — Z961 Presence of intraocular lens: Secondary | ICD-10-CM | POA: Diagnosis not present

## 2022-05-22 DIAGNOSIS — H401123 Primary open-angle glaucoma, left eye, severe stage: Secondary | ICD-10-CM | POA: Diagnosis not present

## 2022-05-22 DIAGNOSIS — H401111 Primary open-angle glaucoma, right eye, mild stage: Secondary | ICD-10-CM | POA: Diagnosis not present

## 2022-05-23 ENCOUNTER — Other Ambulatory Visit: Payer: Self-pay | Admitting: Internal Medicine

## 2022-06-26 DIAGNOSIS — H524 Presbyopia: Secondary | ICD-10-CM | POA: Diagnosis not present

## 2022-07-25 ENCOUNTER — Other Ambulatory Visit: Payer: Self-pay | Admitting: Family Medicine

## 2022-09-19 DIAGNOSIS — Z23 Encounter for immunization: Secondary | ICD-10-CM | POA: Diagnosis not present

## 2022-09-25 ENCOUNTER — Encounter: Payer: Self-pay | Admitting: Gastroenterology

## 2022-09-25 ENCOUNTER — Ambulatory Visit (INDEPENDENT_AMBULATORY_CARE_PROVIDER_SITE_OTHER): Payer: Medicare Other | Admitting: Gastroenterology

## 2022-09-25 VITALS — BP 130/72 | HR 85 | Ht 64.0 in | Wt 136.1 lb

## 2022-09-25 DIAGNOSIS — R109 Unspecified abdominal pain: Secondary | ICD-10-CM

## 2022-09-25 DIAGNOSIS — I70209 Unspecified atherosclerosis of native arteries of extremities, unspecified extremity: Secondary | ICD-10-CM | POA: Diagnosis not present

## 2022-09-25 NOTE — Progress Notes (Signed)
09/25/2022 Shenetta Schnackenberg [id number] 09/08/1941   HISTORY OF PRESENT ILLNESS:  This is an 81 year old female with complaints of intermittent right-sided abdominal pain.  This has been going on intermittently at least since early 2021.  Pain comes and goes, no definite aggravating factors.  She has had extensive work-up over the years with ultrasound, CT scans, imaging of her back, HIDA scan, etc. without any definite cause of pain seen.  Thought to be possibly musculoskeletal.  Tylenol helps.  Does have issues with constipation, but takes her MiraLAX and prune juice regularly.  No weight loss or other associated symptoms.  Right upper quadrant ultrasound April 27, 2020: Diminutive 2 mm gallbladder polyp otherwise no cholecystitis   CT abdomen pelvis March 11, 2020: Mild to moderate colonic stool burden otherwise unremarkable  HIDA scan 10/2020 was normal.  RUQ abdominal ultrasound 04/2021:  Stable/unchanged 2 mm gallbladder polyp.   EGD February 20, 2017: Normal esophagus, mild gastritis biopsies negative for H. pylori, duodenal erosions otherwise normal.   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 She has never used smokeless tobacco. She reports current alcohol use. She reports that she does not use drugs. family history includes Breast cancer in her mother; Cancer in her mother; Diabetes in her brother and father; Heart disease in her brother; Hypertension in her father and sister; Liver cancer in her mother. Allergies  Allergen  Reactions   Darvon [Propoxyphene]     Darvocet-Makes her "crazy"   Morphine And Related     Makes her "crazy"   Percocet [Oxycodone-Acetaminophen]     Makes her "crazy"   Vicodin [Hydrocodone-Acetaminophen]     Makes her "crazy"      Outpatient Encounter Medications as of 09/25/2022  Medication Sig   acetaminophen (TYLENOL) 500 MG tablet Take 1,000 mg by mouth every 6 (six) hours as needed for moderate pain or headache.    BIOTIN PO Take 1,000 mcg by mouth daily.    brimonidine (ALPHAGAN) 0.2 % ophthalmic solution Place 1 drop into the right eye in the morning and at bedtime.   Calcium Carbonate (CALCIUM 600 PO) Take 1 tablet by mouth in the morning and at bedtime.   Cyanocobalamin (VITAMIN B 12 PO) Take 500 mg by mouth daily.   diclofenac Sodium (VOLTAREN) 1 % GEL Apply 4 g topically 4 (four) times daily.   dicyclomine (BENTYL) 10 MG capsule TAKE 1 CAPSULE TWICE DAILY AS NEEDED FOR SPASMS   DULoxetine (CYMBALTA) 20 MG capsule TAKE 1 CAPSULE EVERY DAY   Latanoprostene Bunod (VYZULTA OP) Place 1 drop into the right eye daily.   lidocaine (LIDODERM) 5 % Place 1 patch onto the skin daily. Remove & Discard patch within 12 hours or as directed by MD   meclizine (ANTIVERT) 12.5 MG tablet Take 12.5 mg by mouth 3 (three) times daily as needed for dizziness.    Multiple Vitamin (ONE-A-DAY ESSENTIAL PO) Take by  mouth daily.   Omega-3 1000 MG CAPS Take 1 capsule by mouth daily.    omeprazole (PRILOSEC) 40 MG capsule TAKE 1 CAPSULE TWICE DAILY   polyethylene glycol (MIRALAX / GLYCOLAX) 17 g packet Take 17 g by mouth as needed.   rosuvastatin (CRESTOR) 10 MG tablet TAKE 1 TABLET EVERY DAY   Turmeric 500 MG TABS Take 1 tablet by mouth daily.    dorzolamide-timolol (COSOPT) 22.3-6.8 MG/ML ophthalmic solution Place 1 drop into both eyes 2 (two) times daily.  (Patient not taking: Reported on 09/25/2022)   latanoprost (XALATAN) 0.005 % ophthalmic solution Place 1 drop into both eyes at bedtime.  (Patient  not taking: Reported on 04/23/2022)   No facility-administered encounter medications on file as of 09/25/2022.     REVIEW OF SYSTEMS  : All other systems reviewed and negative except where noted in the History of Present Illness.   PHYSICAL EXAM: BP 130/72   Pulse 85   Ht '5\' 4"'$  (1.626 m)   Wt 136 lb 2 oz (61.7 kg)   BMI 23.37 kg/m  General: Well developed female in no acute distress Head: Normocephalic and atraumatic Eyes:  Sclerae anicteric, conjunctiva pink. Ears: Normal auditory acuity Lungs: Clear throughout to auscultation; no W/R/R. Heart: Regular rate and rhythm; no M/R/G. Abdomen: Soft, non-distended.  BS present.  Non-tender. Musculoskeletal: Symmetrical with no gross deformities  Skin: No lesions on visible extremities Extremities: No edema  Neurological: Alert oriented x 4, grossly non-focal Psychological:  Alert and cooperative. Normal mood and affect  ASSESSMENT AND PLAN: *81 year old female with complaints of intermittent right-sided abdominal pain.  This has been going on intermittently at least since early 2021.  She has had extensive work-up over the years with ultrasound, CT scans, imaging of her back, HIDA scan, etc. without any definite cause of pain seen.  Thought to be possibly musculoskeletal.  Tylenol helps.  Does have issues with constipation, but takes her MiraLAX and prune juice regularly.  Reassurance was given and imaging studies were reviewed with her.  She has had no other associated symptoms, no weight loss. She will continue to treat topically with patches, etc and with Tylenol prn.  CC:  Hoyt Koch, *

## 2022-09-25 NOTE — Patient Instructions (Addendum)
If you are age 80 or older, your body mass index should be between 23-30. Your Body mass index is 23.37 kg/m. If this is out of the aforementioned range listed, please consider follow up with your Primary Care Provider.  The [facility] providers would like to encourage you to use [facility] to communicate with providers for non-urgent requests or questions.  Due to long hold times on the telephone, sending your provider a message by [facility] may be a faster and more efficient way to get a response.  Please allow 48 business hours for a response.  Please remember that this is for non-urgent requests.  _______________________________________________________   Follow up as needed.  It was a pleasure to see you today!  Thank you for trusting me with your gastrointestinal care!

## 2022-10-10 DIAGNOSIS — Z961 Presence of intraocular lens: Secondary | ICD-10-CM | POA: Diagnosis not present

## 2022-10-10 DIAGNOSIS — H401123 Primary open-angle glaucoma, left eye, severe stage: Secondary | ICD-10-CM | POA: Diagnosis not present

## 2022-10-10 DIAGNOSIS — H401111 Primary open-angle glaucoma, right eye, mild stage: Secondary | ICD-10-CM | POA: Diagnosis not present

## 2022-10-17 DIAGNOSIS — Z23 Encounter for immunization: Secondary | ICD-10-CM | POA: Diagnosis not present

## 2022-10-23 ENCOUNTER — Telehealth: Payer: Medicare Other

## 2022-10-24 DIAGNOSIS — H401111 Primary open-angle glaucoma, right eye, mild stage: Secondary | ICD-10-CM | POA: Diagnosis not present

## 2022-11-07 DIAGNOSIS — H401111 Primary open-angle glaucoma, right eye, mild stage: Secondary | ICD-10-CM | POA: Diagnosis not present

## 2022-11-07 DIAGNOSIS — H524 Presbyopia: Secondary | ICD-10-CM | POA: Diagnosis not present

## 2022-11-07 DIAGNOSIS — H401123 Primary open-angle glaucoma, left eye, severe stage: Secondary | ICD-10-CM | POA: Diagnosis not present

## 2022-12-10 DIAGNOSIS — H401123 Primary open-angle glaucoma, left eye, severe stage: Secondary | ICD-10-CM | POA: Diagnosis not present

## 2023-01-01 ENCOUNTER — Ambulatory Visit (INDEPENDENT_AMBULATORY_CARE_PROVIDER_SITE_OTHER): Payer: Medicare Other | Admitting: Internal Medicine

## 2023-01-01 ENCOUNTER — Encounter: Payer: Self-pay | Admitting: Internal Medicine

## 2023-01-01 ENCOUNTER — Telehealth: Payer: Self-pay

## 2023-01-01 VITALS — BP 128/60 | HR 67 | Temp 97.8°F | Ht 64.0 in | Wt 134.0 lb

## 2023-01-01 DIAGNOSIS — R109 Unspecified abdominal pain: Secondary | ICD-10-CM | POA: Diagnosis not present

## 2023-01-01 DIAGNOSIS — E782 Mixed hyperlipidemia: Secondary | ICD-10-CM

## 2023-01-01 DIAGNOSIS — M25512 Pain in left shoulder: Secondary | ICD-10-CM

## 2023-01-01 DIAGNOSIS — M542 Cervicalgia: Secondary | ICD-10-CM

## 2023-01-01 DIAGNOSIS — E673 Hypervitaminosis D: Secondary | ICD-10-CM

## 2023-01-01 DIAGNOSIS — I70209 Unspecified atherosclerosis of native arteries of extremities, unspecified extremity: Secondary | ICD-10-CM | POA: Diagnosis not present

## 2023-01-01 DIAGNOSIS — G8929 Other chronic pain: Secondary | ICD-10-CM

## 2023-01-01 DIAGNOSIS — R221 Localized swelling, mass and lump, neck: Secondary | ICD-10-CM | POA: Diagnosis not present

## 2023-01-01 LAB — COMPREHENSIVE METABOLIC PANEL
ALT: 18 U/L (ref 0–35)
AST: 27 U/L (ref 0–37)
Albumin: 4.4 g/dL (ref 3.5–5.2)
Alkaline Phosphatase: 99 U/L (ref 39–117)
BUN: 12 mg/dL (ref 6–23)
CO2: 30 mEq/L (ref 19–32)
Calcium: 10 mg/dL (ref 8.4–10.5)
Chloride: 106 mEq/L (ref 96–112)
Creatinine, Ser: 0.87 mg/dL (ref 0.40–1.20)
GFR: 62.98 mL/min (ref >60.00)
Glucose, Bld: 86 mg/dL (ref 70–99)
Potassium: 4.3 mEq/L (ref 3.5–5.1)
Sodium: 143 mEq/L (ref 135–145)
Total Bilirubin: 0.6 mg/dL (ref 0.2–1.2)
Total Protein: 7.3 g/dL (ref 6.0–8.3)

## 2023-01-01 LAB — LIPID PANEL
Cholesterol: 170 mg/dL (ref 0–200)
HDL: 60.9 mg/dL (ref >39.00)
LDL Cholesterol: 94 mg/dL (ref 0–99)
NonHDL: 108.66
Total CHOL/HDL Ratio: 3
Triglycerides: 75 mg/dL (ref 0.0–149.0)
VLDL: 15 mg/dL (ref 0.0–40.0)

## 2023-01-01 LAB — CBC
HCT: 43.7 % (ref 36.0–46.0)
Hemoglobin: 14.5 g/dL (ref 12.0–15.0)
MCHC: 33.3 g/dL (ref 30.0–36.0)
MCV: 94.7 fl (ref 78.0–100.0)
Platelets: 199 10*3/uL (ref 150.0–400.0)
RBC: 4.62 Mil/uL (ref 3.87–5.11)
RDW: 14.3 % (ref 11.5–15.5)
WBC: 6.6 10*3/uL (ref 4.0–10.5)

## 2023-01-01 LAB — VITAMIN D 25 HYDROXY (VIT D DEFICIENCY, FRACTURES): VITD: 107.84 ng/mL (ref 30.00–100.00)

## 2023-01-01 MED ORDER — ROSUVASTATIN CALCIUM 10 MG PO TABS: 10.0000 mg | ORAL_TABLET | Freq: Every day | ORAL | 3 refills | Status: DC

## 2023-01-01 NOTE — Patient Instructions (Addendum)
The EKG today is normal and we will check the labs to check all the systems.   We are will get the ultrasound of the neck.

## 2023-01-01 NOTE — Assessment & Plan Note (Signed)
Overall more often lately and she is taking tylenol. Previously tried muscle relaxers and they did cause significant side effects. Using otc patches which help some as well. Prior significant workup no further imaging needed today.

## 2023-01-01 NOTE — Assessment & Plan Note (Signed)
Checking vitamin D, last year 106 adjust as needed.

## 2023-01-01 NOTE — Assessment & Plan Note (Signed)
Checking lipid panel and adjust crestor 10 mg daily as needed. 

## 2023-01-01 NOTE — Assessment & Plan Note (Signed)
EKG done to assess and rule out cardiac etiology and other than bradycardia not changed from prior 2020.

## 2023-01-01 NOTE — Assessment & Plan Note (Signed)
Checking x-ray neck to assess arthritis burden.

## 2023-01-01 NOTE — Telephone Encounter (Signed)
Will address via result note no urgent action needed

## 2023-01-01 NOTE — Telephone Encounter (Signed)
CRITICAL VALUE STICKER  CRITICAL VALUE: Vitamin D-108   RECEIVER (on-site recipient of call):  DATE & TIME NOTIFIED: 01/01/2023  MESSENGER (representative from lab): Joy  MD NOTIFIED: Dr Sharlet Salina   TIME OF NOTIFICATION:12:55pm  RESPONSE:  n/a

## 2023-01-01 NOTE — Progress Notes (Signed)
   Subjective:   Patient ID: Wanda Rivera, female    DOB: 11-19-1941, 82 y.o.   MRN: [id number]  HPI The patient is an 82 YO female coming in for follow up. Having a lot of orthopedic problems.  Review of Systems  Constitutional:  Positive for activity change. Negative for appetite change, chills, fatigue, fever and unexpected weight change.  HENT: Negative.    Eyes: Negative.   Respiratory: Negative.  Negative for cough, chest tightness and shortness of breath.   Cardiovascular: Negative.  Negative for chest pain, palpitations and leg swelling.  Gastrointestinal: Negative.  Negative for abdominal distention, abdominal pain, constipation, diarrhea, nausea and vomiting.  Musculoskeletal:  Positive for arthralgias, back pain, myalgias and neck pain. Negative for gait problem and joint swelling.  Skin: Negative.   Neurological: Negative.   Psychiatric/Behavioral: Negative.      Objective:  Physical Exam Constitutional:      Appearance: She is well-developed.  HENT:     Head: Normocephalic and atraumatic.  Neck:     Comments: Small 1cm circular lesion left posterior neck Cardiovascular:     Rate and Rhythm: Normal rate and regular rhythm.  Pulmonary:     Effort: Pulmonary effort is normal. No respiratory distress.     Breath sounds: Normal breath sounds. No wheezing or rales.  Abdominal:     General: Bowel sounds are normal. There is no distension.     Palpations: Abdomen is soft.     Tenderness: There is no abdominal tenderness. There is no rebound.  Musculoskeletal:        General: Tenderness present.     Cervical back: Normal range of motion.     Comments: Left neck and upper arm tenderness, RUQ/thoracic tenderness  Skin:    General: Skin is warm and dry.  Neurological:     Mental Status: She is alert and oriented to person, place, and time.     Coordination: Coordination normal.     Vitals:   01/01/23 0912  BP: 128/60  Pulse: 67  Temp: 97.8 F (36.6 C)   TempSrc: Oral  SpO2: 95%  Weight: 134 lb (60.8 kg)  Height: '5\' 4"'$  (1.626 m)   EKG: Rate 42, axis normal, interval normal, sinus brady, no st or t wave changes possible LVH, no significant change compared to prior except bradycardia   Assessment & Plan:  Visit time 25 minutes in face to face communication with patient and coordination of care, additional 10 minutes spent in record review, coordination or care, ordering tests, communicating/referring to other healthcare professionals, documenting in medical records all on the same day of the visit for total time 35 minutes spent on the visit.

## 2023-01-01 NOTE — Assessment & Plan Note (Signed)
Given prior breast cancer will get [place] to evaluate likely lipoma behind left ear on neck

## 2023-01-04 ENCOUNTER — Ambulatory Visit
Admission: RE | Admit: 2023-01-04 | Discharge: 2023-01-04 | Disposition: A | Discharge status: Home / Self Care | Payer: Medicare Other | Source: Ambulatory Visit | Attending: Internal Medicine | Admitting: Internal Medicine

## 2023-01-04 DIAGNOSIS — M542 Cervicalgia: Secondary | ICD-10-CM

## 2023-01-04 DIAGNOSIS — R221 Localized swelling, mass and lump, neck: Secondary | ICD-10-CM | POA: Diagnosis not present

## 2023-01-04 DIAGNOSIS — G8929 Other chronic pain: Secondary | ICD-10-CM

## 2023-01-18 ENCOUNTER — Ambulatory Visit: Payer: Medicare Other

## 2023-01-25 ENCOUNTER — Ambulatory Visit: Payer: Medicare Other

## 2023-01-28 ENCOUNTER — Ambulatory Visit (INDEPENDENT_AMBULATORY_CARE_PROVIDER_SITE_OTHER): Payer: Medicare Other

## 2023-01-28 DIAGNOSIS — Z0000 Encounter for general adult medical examination without abnormal findings: Secondary | ICD-10-CM | POA: Diagnosis not present

## 2023-01-28 NOTE — Progress Notes (Signed)
Subjective:   Wanda Rivera is a 82 y.o. female who presents for Medicare Annual (Subsequent) preventive examination.  I connected with Briselda today by telephone and verified that I am speaking with the correct person using two identifiers. I discussed the limitations, risks, security and privacy concerns of performing an evaluation and management service by telephone and the availability of in person appointments. I also discussed with the patient that there may be a patient responsible charge related to this service. The patient expressed understanding and agreed to proceed. Location patient: home Location provider: Pietro Cassis Persons participating in the visit: Tasnia and Jillene Bucks, [place].  Time Spent with patient on telephone encounter: 40 mins  Review of Systems    No ROS. Medicare Wellness Telephone Visit. Additional risk factors are reflected in social history. Cardiac Risk Factors include: advanced age (>25mn, >>23women);dyslipidemia     Objective:    Today's Vitals   01/28/23 0854  PainSc: 5    There is no height or weight on file to calculate BMI.     01/28/2023    9:13 AM 01/16/2022    1:52 PM 01/02/2021   10:07 AM 11/10/2020   10:06 PM 05/04/2020    8:56 AM 12/17/2018   10:48 AM 02/21/2018    6:18 PM  Advanced Directives  Does Patient Have a Medical Advance Directive? Yes Yes Yes No No Yes No  Type of [place]amed[place]of [place] Living will;Healthcare Power of Attorney Living will;Healthcare Power of [place]   Does patient want to make changes to medical advance directive? No - Patient declined No - Patient declined No - Patient declined      Copy of [place] Chart? No - copy requested No - copy requested No - copy requested   No - copy requested   Would patient like information on creating a medical advance directive?    No - Patient declined No - Patient  declined  No - Patient declined    Current Medications (verified) Outpatient Encounter Medications as of 01/28/2023  Medication Sig   acetaminophen (TYLENOL) 500 MG tablet Take 1,000 mg by mouth every 6 (six) hours as needed for moderate pain or headache.    BIOTIN PO Take 1,000 mcg by mouth daily.    brimonidine (ALPHAGAN) 0.2 % ophthalmic solution Place 1 drop into the right eye in the morning and at bedtime.   Calcium Carbonate (CALCIUM 600 PO) Take 1 tablet by mouth in the morning and at bedtime.   Cyanocobalamin (VITAMIN B 12 PO) Take 500 mg by mouth daily.   diclofenac Sodium (VOLTAREN) 1 % GEL Apply 4 g topically 4 (four) times daily.   dicyclomine (BENTYL) 10 MG capsule TAKE 1 CAPSULE TWICE DAILY AS NEEDED FOR SPASMS   dorzolamide-timolol (COSOPT) 22.3-6.8 MG/ML ophthalmic solution Place 1 drop into both eyes 2 (two) times daily.   DULoxetine (CYMBALTA) 20 MG capsule TAKE 1 CAPSULE EVERY DAY   latanoprost (XALATAN) 0.005 % ophthalmic solution Place 1 drop into both eyes at bedtime.   Latanoprostene Bunod (VYZULTA OP) Place 1 drop into the right eye daily.   lidocaine (LIDODERM) 5 % Place 1 patch onto the skin daily. Remove & Discard patch within 12 hours or as directed by MD   meclizine (ANTIVERT) 12.5 MG tablet Take 12.5 mg by mouth 3 (three) times daily as needed for dizziness.    Multiple Vitamin (ONE-A-DAY ESSENTIAL PO) Take  by mouth daily.   Omega-3 1000 MG CAPS Take 1 capsule by mouth daily.    omeprazole (PRILOSEC) 40 MG capsule TAKE 1 CAPSULE TWICE DAILY   polyethylene glycol (MIRALAX / GLYCOLAX) 17 g packet Take 17 g by mouth as needed.   rosuvastatin (CRESTOR) 10 MG tablet Take 1 tablet (10 mg total) by mouth daily.   Turmeric 500 MG TABS Take 1 tablet by mouth daily.    No facility-administered encounter medications on file as of 01/28/2023.    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,hands   Breast cancer ([place])    1982, recurrence in 1985   Cataract    Bil   Glaucoma    Bil   Osteoporosis    Past Surgical History:  Procedure Laterality Date   ABDOMINAL HYSTERECTOMY     BREAST SURGERY     double masectomy for breast cancer   eye surgery Bilateral 06/05/2017   for Glaucoma   Family History  Problem Relation Age of Onset   Cancer Mother    Breast cancer Mother    Liver cancer Mother    Diabetes Father    Hypertension Father    Hypertension Sister    Diabetes Brother    Heart disease Brother    Social History   Socioeconomic History   Marital status: Divorced    Spouse name: Not on file   Number of children: 1   Years of education: Not on file   Highest education level: Not on file  Occupational History   Occupation: retired  Tobacco Use   Smoking status: Never   Smokeless tobacco: Never  Vaping Use   Vaping Use: Never used  Substance and Sexual Activity   Alcohol use: Yes    Alcohol/week: 0.0 standard drinks of alcohol    Comment: occasionally a glass of red wine   Drug use: No   Sexual activity: Never  Other Topics Concern   Not on file  Social History Narrative   Not on file   Social Determinants of Health   Financial Resource Strain: Low Risk  (01/28/2023)   Overall Financial Resource Strain (CARDIA)    Difficulty of Paying Living Expenses: Not hard at all  Food Insecurity: No Food Insecurity (01/28/2023)   Hunger Vital Sign    Worried About Running Out of Food in the Last Year: Never true    Ran Out of Food in the Last Year: Never true  Transportation Needs: No Transportation Needs (01/28/2023)   PRAPARE - Hydrologist (Medical): No    Lack of Transportation (Non-Medical): No  Physical Activity: Insufficiently Active (01/28/2023)   Exercise Vital Sign    Days of Exercise per Week: 2 days     Minutes of Exercise per Session: 20 min  Stress: Stress Concern Present (01/28/2023)   [place]    Feeling of Stress : To some extent  Social Connections: Moderately Isolated (01/28/2023)   Social Connection and Isolation Panel [NHANES]    Frequency of Communication with Friends and Family: More than three times a week    Frequency of Social Gatherings with Friends and Family: Three times a week    Attends Religious Services: More than 4 times per year    Active Member of Clubs or Organizations: No    Attends  Club or Organization Meetings: Never    Marital Status: Widowed    Tobacco Counseling Counseling given: Not Answered   Clinical Intake:  Pre-visit preparation completed: Yes  Pain : 0-10 Pain Score: 5  Pain Type: Chronic pain Pain Location: Neck Pain Orientation: Left Pain Descriptors / Indicators: Dull Pain Onset: More than a month ago (couple months ago) Pain Frequency: Intermittent Pain Relieving Factors: icey hot and extra strength tylenol  Pain Relieving Factors: icey hot and extra strength tylenol  BMI - recorded: 23 Nutritional Status: BMI of 19-24  Normal Nutritional Risks: None Diabetes: No  How often do you need to have someone help you when you read instructions, pamphlets, or other written materials from your doctor or pharmacy?: 1 - Never   Interpreter Needed?: No  Information entered by :: Jillene Bucks, [place]   Activities of Daily Living    01/28/2023    9:14 AM  In your present state of health, do you have any difficulty performing the following activities:  Hearing? 0  Vision? 0  Difficulty concentrating or making decisions? 0  Walking or climbing stairs? 0  Dressing or bathing? 0  Doing errands, shopping? 0  Preparing Food and eating ? N  Using the Toilet? N  In the past six months, have you accidently leaked urine? N  Do you have problems with loss of bowel control? N   Managing your Medications? N  Managing your Finances? N  Housekeeping or managing your Housekeeping? N    Patient Care Team: Hoyt Koch, MD as PCP - General (Internal Medicine) Adrian Prows, MD as Consulting Physician (Cardiology) [facility] as Consulting Physician (Ophthalmology) Mauri Pole, MD as Consulting Physician (Gastroenterology) Gregor Hams, MD as Consulting Physician (Sports Medicine) Bretta Bang, MD as Consulting Physician (Ophthalmology) Delice Bison, Darnelle Maffucci, [facility] (Inactive) (Pharmacist)  Indicate any recent Medical Services you may have received from other than [facility] providers in the past year (date may be approximate).     Assessment:   This is a routine wellness examination for Wanda Rivera.  Hearing/Vision screen Patient denied any hearing difficulty. No hearing aids. Patient wears reading glasses.   Dietary issues and exercise activities discussed: Current Exercise Habits: Home exercise routine, Type of exercise: walking, Time (Minutes): 20, Frequency (Times/Week): 2, Weekly Exercise (Minutes/Week): 40, Intensity: Mild, Exercise limited by: None identified   Goals Addressed             This Visit's Progress    Patient Stated       Continue current lifestyle.       Depression Screen    01/28/2023    9:08 AM 01/01/2023    9:21 AM 01/16/2022    2:00 PM 01/02/2021   10:12 AM 12/21/2020   10:04 AM 12/21/2019    8:41 AM 12/17/2018   10:49 AM  PHQ 2/9 Scores  PHQ - 2 Score 1 0 0 0 1 0 0  PHQ- 9 Score  0   6      Fall Risk    01/28/2023    9:13 AM 01/01/2023    9:20 AM 01/16/2022    1:55 PM 01/02/2021   10:08 AM 12/21/2020    9:58 AM  Fall Risk   Falls in the past year? 1 0 0 1 1  Number falls in past yr: 0 0 0 0 0  Injury with Fall? 0 0 0 1 1  Risk for fall due to : History of fall(s)  No Fall Risks  History of fall(s) History of fall(s)  Follow up Falls evaluation completed Falls evaluation completed Falls evaluation  completed Falls evaluation completed Falls evaluation completed    [place]:  Any stairs in or around the home? Yes  If so, are there any without handrails? No  Home free of loose throw rugs in walkways, pet beds, electrical cords, etc? Yes  Adequate lighting in your home to reduce risk of falls? Yes   ASSISTIVE DEVICES UTILIZED TO PREVENT FALLS:  Life alert? No  Use of a cane, walker or w/c? Yes has a cane if she needs it Grab bars in the bathroom? No  Shower chair or bench in shower? No  Elevated toilet seat or a handicapped toilet? No   TIMED UP AND GO:  Was the test performed? No .  Length of time to ambulate 10 feet: N/A sec.   Patient stated that she has no issues with gait or balance; does not use any assistive devices.  Cognitive Function: Patient is cogitatively intact.    12/17/2018   10:57 AM  MMSE - Mini Mental State Exam  Orientation to time 5  Orientation to Place 5  Registration 3  Attention/ Calculation 4  Recall 2  Language- name 2 objects 2  Language- repeat 1  Language- follow 3 step command 3  Language- read & follow direction 1  Write a sentence 1  Copy design 1  Total score 28        01/28/2023    9:16 AM  6CIT Screen  What Year? 0 points  What month? 0 points  What time? 0 points  Count back from 20 0 points  Months in reverse 0 points  Repeat phrase 0 points  Total Score 0 points    Immunizations Immunization History  Administered Date(s) Administered   Influenza Whole 09/30/2014   Influenza, High Dose Seasonal PF 09/14/2019, 09/18/2021, 09/19/2022   Influenza-Unspecified 09/30/2015, 09/13/2017, 09/19/2018, 09/14/2020   PFIZER(Purple Top)SARS-COV-2 Vaccination 03/15/2020, 04/05/2020, 11/01/2020   Pfizer Covid-19 Vaccine Bivalent Booster 28yr & up 09/18/2021, 10/09/2022   Pneumococcal Conjugate-13 09/30/2015   Pneumococcal Polysaccharide-23 12/10/2016   Tdap 12/09/2015   Zoster Recombinat  (Shingrix) 03/05/2022   Zoster, Live 01/01/2012    TDAP status: Up to date  Flu Vaccine status: Up to date  Pneumococcal vaccine status: Up to date  Covid-19 vaccine status: Completed vaccines  Qualifies for Shingles Vaccine? Yes   Zostavax completed No   Shingrix Completed?: No.    Education has been provided regarding the importance of this vaccine. Patient has been advised to call insurance company to determine out of pocket expense if they have not yet received this vaccine. Advised may also receive vaccine at local pharmacy or Health Dept. Verbalized acceptance and understanding.  Screening Tests Health Maintenance  Topic Date Due   Zoster Vaccines- Shingrix (2 of 2) 04/30/2022   COVID-19 Vaccine (6 - 2023-24 season) 12/04/2022   Medicare Annual Wellness (AWV)  01/16/2023   DTaP/Tdap/Td (2 - Td or Tdap) 12/08/2025   Pneumonia Vaccine 82 Years old  Completed   INFLUENZA VACCINE  Completed   DEXA SCAN  Completed   HPV VACCINES  Aged Out    Health Maintenance  Health Maintenance Due  Topic Date Due   Zoster Vaccines- Shingrix (2 of 2) 04/30/2022   COVID-19 Vaccine (6 - 2023-24 season) 12/04/2022   Medicare Annual Wellness (AWV)  01/16/2023    Colorectal cancer screening: No longer required.  Mammogram status: No longer required due to age.  Bone Density status: Completed 12/18/2018. Results reflect: Bone density results: OSTEOPENIA. Repeat every N/A years.  Lung Cancer Screening: (Low Dose CT Chest recommended if Age 41-80 years, 30 pack-year currently smoking OR have quit w/in 15years.) does not qualify.   Lung Cancer Screening Referral: N/A  Additional Screening:  Hepatitis C Screening: does not qualify; Completed N/A  Vision Screening: Recommended annual ophthalmology exams for early detection of glaucoma and other disorders of the eye. Is the patient up to date with their annual eye exam?  Yes  Who is the provider or what is the name of the office in  which the patient attends annual eye exams? [facility] If pt is not established with a provider, would they like to be referred to a provider to establish care? No .   Dental Screening: Recommended annual dental exams for proper oral hygiene  Community Resource Referral / Chronic Care Management: CRR required this visit?  No   CCM required this visit?  No      Plan:     I have personally reviewed and noted the following in the patient's chart:   Medical and social history Use of alcohol, tobacco or illicit drugs  Current medications and supplements including opioid prescriptions. Patient is not currently taking opioid prescriptions. Functional ability and status Nutritional status Physical activity Advanced directives List of other physicians Hospitalizations, surgeries, and ER visits in previous 12 months Vitals Screenings to include cognitive, depression, and falls Referrals and appointments  In addition, I have reviewed and discussed with patient certain preventive protocols, quality metrics, and best practice recommendations. A written personalized care plan for preventive services as well as general preventive health recommendations were provided to patient.     Rossie Muskrat, [place]   01/28/2023   Nurse Notes:  Appointment scheduled for tomorrow per patient request to follow up on continued neck pain.

## 2023-01-29 ENCOUNTER — Encounter: Payer: Self-pay | Admitting: Internal Medicine

## 2023-01-29 ENCOUNTER — Ambulatory Visit (INDEPENDENT_AMBULATORY_CARE_PROVIDER_SITE_OTHER): Payer: Medicare Other | Admitting: Internal Medicine

## 2023-01-29 VITALS — BP 118/70 | HR 53 | Temp 98.2°F | Ht 64.0 in | Wt 132.2 lb

## 2023-01-29 DIAGNOSIS — R109 Unspecified abdominal pain: Secondary | ICD-10-CM | POA: Diagnosis not present

## 2023-01-29 DIAGNOSIS — M12811 Other specific arthropathies, not elsewhere classified, right shoulder: Secondary | ICD-10-CM

## 2023-01-29 DIAGNOSIS — M542 Cervicalgia: Secondary | ICD-10-CM | POA: Diagnosis not present

## 2023-01-29 DIAGNOSIS — I70209 Unspecified atherosclerosis of native arteries of extremities, unspecified extremity: Secondary | ICD-10-CM

## 2023-01-29 NOTE — Assessment & Plan Note (Signed)
Reassurance given adequate workup has been completed. Using cymbalta 20 mg qhs and this helps well overall.

## 2023-01-29 NOTE — Assessment & Plan Note (Signed)
Advised likely coming from arthritis. Using icy hot and advised she can use voltaren up to QID for pain and tylenol.

## 2023-01-29 NOTE — Progress Notes (Signed)
   Subjective:   Patient ID: Wanda Rivera, female    DOB: 24-Jan-1941, 82 y.o.   MRN: [id number]  HPI The patient is an 82 YO female coming in for ongoing arthritis pains  Review of Systems  Constitutional: Negative.   HENT: Negative.    Eyes: Negative.   Respiratory:  Negative for cough, chest tightness and shortness of breath.   Cardiovascular:  Negative for chest pain, palpitations and leg swelling.  Gastrointestinal:  Negative for abdominal distention, abdominal pain, constipation, diarrhea, nausea and vomiting.  Musculoskeletal:  Positive for arthralgias and myalgias.  Skin: Negative.   Neurological: Negative.   Psychiatric/Behavioral: Negative.      Objective:  Physical Exam Constitutional:      Appearance: She is well-developed.  HENT:     Head: Normocephalic and atraumatic.  Cardiovascular:     Rate and Rhythm: Normal rate and regular rhythm.  Pulmonary:     Effort: Pulmonary effort is normal. No respiratory distress.     Breath sounds: Normal breath sounds. No wheezing or rales.  Abdominal:     General: Bowel sounds are normal. There is no distension.     Palpations: Abdomen is soft.     Tenderness: There is no abdominal tenderness. There is no rebound.  Musculoskeletal:        General: Tenderness present.     Cervical back: Normal range of motion.  Skin:    General: Skin is warm and dry.  Neurological:     Mental Status: She is alert and oriented to person, place, and time.     Coordination: Coordination normal.     Vitals:   01/29/23 1300  BP: 118/70  Pulse: (!) 53  Temp: 98.2 F (36.8 C)  TempSrc: Oral  SpO2: 98%  Weight: 132 lb 3.2 oz (60 kg)  Height: '5\' 4"'$  (1.626 m)    Assessment & Plan:  Visit time 15 minutes in face to face communication with patient and coordination of care, additional 5 minutes spent in record review, coordination or care, ordering tests, communicating/referring to other healthcare professionals, documenting in medical  records all on the same day of the visit for total time 20 minutes spent on the visit.

## 2023-01-29 NOTE — Patient Instructions (Signed)
You can use the voltaren on the neck up to 4 times a day. Call Dr. Georgina Snell if you want him to inject the shoulder.

## 2023-01-29 NOTE — Assessment & Plan Note (Signed)
Prior injection and having more consistent pain and limitation of ROM. Advised to see sports medicine again and they can likely do repeat injection. Last was years ago.

## 2023-01-30 ENCOUNTER — Ambulatory Visit (INDEPENDENT_AMBULATORY_CARE_PROVIDER_SITE_OTHER): Payer: Medicare Other | Admitting: Family Medicine

## 2023-01-30 ENCOUNTER — Ambulatory Visit: Payer: Self-pay

## 2023-01-30 VITALS — BP 128/80 | Ht 64.0 in | Wt 134.0 lb

## 2023-01-30 DIAGNOSIS — I70209 Unspecified atherosclerosis of native arteries of extremities, unspecified extremity: Secondary | ICD-10-CM | POA: Diagnosis not present

## 2023-01-30 DIAGNOSIS — M79644 Pain in right finger(s): Secondary | ICD-10-CM

## 2023-01-30 DIAGNOSIS — M542 Cervicalgia: Secondary | ICD-10-CM | POA: Diagnosis not present

## 2023-01-30 DIAGNOSIS — M25511 Pain in right shoulder: Secondary | ICD-10-CM | POA: Diagnosis not present

## 2023-01-30 MED ORDER — METHYLPREDNISOLONE ACETATE 80 MG/ML IJ SUSP
80.0000 mg | Freq: Once | INTRAMUSCULAR | Status: AC
  Administered 2023-01-30: 80 mg via INTRA_ARTICULAR

## 2023-01-30 NOTE — Patient Instructions (Addendum)
Thank you for coming in today.   You received an injection today. Seek immediate medical attention if the joint becomes red, extremely painful, or is oozing fluid.   I've referred you to Physical Therapy.  Let us know if you don't hear from them in one week.   Recheck in 6 weeks.   If not better we can do more.

## 2023-01-30 NOTE — Progress Notes (Signed)
I, Peterson Lombard, LAT, ATC acting as a scribe for Lynne Leader, MD.  Wanda Rivera is a 82 y.o. female who presents to [place] at [facility] today for neck and thumb pain. Pt was previously seen by Dr. Georgina Snell on 10/11/21 for RUQ pain. Pt was seen for her R thumb on 08/22/21. Today, pt c/o c/o thumb pain returned over the last couple months. Pt locates pain to the R 1st MCP joint.   Grip strength: normal Aggravates: grabbing things Treatments tried: Tylenol  Pt also c/o neck pain that's chronic in nature that's flared over the last couple months. 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 Pt locates pain to the L-side of her neck, into L trapz, and into her L shoulder.  Radiates: yes UE Numbness/tingling: no UE Weakness: no Aggravates: nothing Treatments tried: neck collar, icy hot, different pillow  Dx imaging: 01/04/2023 C-spine x-ray   Pertinent review of systems: no fever or chills  Relevant historical information: History of breast cancer.  History of vertebral compression fracture at L3.   Exam:  BP 128/80   Ht '5\' 4"'$  (1.626 m)   Wt 134 lb (60.8 kg)   BMI 23.00 kg/m  General: Well Developed, well nourished, and in no acute distress.   MSK: C-spine: Normal appearing Nontender to palpation cervical midline. Tender palpation left cervical paraspinal musculature. Decree cervical motion.  Right shoulder: Normal-appearing Nontender. Range of motion abduction limited to 130 degrees.  Internal rotation lumbar spine.  External rotation full. Strength is intact although painful to resisted abduction testing. Positive Hawkins and Neer's test. Positive empty can test. Negative Yergason and speeds test.  Right hand: Degenerative appearing first CMC and MCP. Minimal triggering present with flexion of the IP joint.  Tender palpation palmar MCP.    Lab and Radiology Results  Procedure: Real-time Ultrasound Guided Injection of right first MCP tendon sheath at  A1 pulley (trigger thumb injection) Device: Philips Affiniti 50G Images permanently stored and available for review in PACS Verbal informed consent obtained.  Discussed risks and benefits of procedure. Warned about infection, bleeding, hyperglycemia damage to structures among others. Patient expresses understanding and agreement Time-out conducted.   Noted no overlying erythema, induration, or other signs of local infection.   Skin prepped in a sterile fashion.   Local anesthesia: Topical Ethyl chloride.   With sterile technique and under real time ultrasound guidance: 0.5 mL of Depo-Medrol 80 mg/mL solution and 0.5 mL of lidocaine injected into tendon sheath. Fluid seen entering the tendon sheath.   Completed without difficulty   Pain immediately resolved suggesting accurate placement of the medication.   Advised to call if fevers/chills, erythema, induration, drainage, or persistent bleeding.   Images permanently stored and available for review in the ultrasound unit.  Impression: Technically successful ultrasound guided injection.   EXAM: CERVICAL SPINE - COMPLETE 4+ VIEW   COMPARISON:  September 01, 2014   FINDINGS: There is no evidence of cervical spine fracture or prevertebral soft tissue swelling. Alignment is normal. Fusion of C5-6 unchanged. Multilevel degenerative joint changes with narrowed joint space, osteophyte formation identified slightly worsened compared prior exam.   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     Electronically Signed   By: Abelardo Diesel M.D.   On: 01/04/2023 13:27 I, Lynne Leader, personally (independently) visualized and performed the interpretation of the images attached in this note.      Assessment and Plan: 82 y.o. female  with right thumb pain.  Pain is consistent with prior trigger thumb and I have proceed with a trigger thumb injection today as that worked pretty well  previously.  Differential does include arthritis which is also certainly apparent on her hand exam.  Additionally she has some left lateral neck pain thought to be muscle spasm and dysfunction.  She does have degenerative changes seen on cervical spine x-ray which could be contributory as well.  Physical therapy should be very helpful for this.  Have referred to PT for primary treatment of left lateral neck pain.  Lastly she has right shoulder pain.  Pain thought to be due to shoulder impingement and bursitis.  Plan again for physical therapy.  Check back in 6 weeks.   PDMP not reviewed this encounter. Orders Placed This Encounter  Procedures   [place] LIMITED JOINT SPACE STRUCTURES UP RIGHT(NO LINKED CHARGES)    Order Specific Question:   Reason for Exam (SYMPTOM  OR DIAGNOSIS REQUIRED)    Answer:   right thumb pain    Order Specific Question:   Preferred imaging location?    Answer:   [place]   Ambulatory referral to Physical Therapy    Referral Priority:   Routine    Referral Type:   Physical Medicine    Referral Reason:   Specialty Services Required    Requested Specialty:   Physical Therapy   Meds ordered this encounter  Medications   methylPREDNISolone acetate (DEPO-MEDROL) injection 80 mg     Discussed warning signs or symptoms. Please see discharge instructions. Patient expresses understanding.   The above documentation has been reviewed and is accurate and complete Lynne Leader, M.D.

## 2023-02-13 ENCOUNTER — Ambulatory Visit: Payer: Medicare Other | Admitting: Physical Therapy

## 2023-02-21 ENCOUNTER — Ambulatory Visit: Payer: Medicare Other | Attending: Internal Medicine

## 2023-02-21 ENCOUNTER — Other Ambulatory Visit: Payer: Self-pay

## 2023-02-21 DIAGNOSIS — M25511 Pain in right shoulder: Secondary | ICD-10-CM | POA: Insufficient documentation

## 2023-02-21 DIAGNOSIS — M6281 Muscle weakness (generalized): Secondary | ICD-10-CM | POA: Diagnosis not present

## 2023-02-21 DIAGNOSIS — G8929 Other chronic pain: Secondary | ICD-10-CM | POA: Diagnosis not present

## 2023-02-21 DIAGNOSIS — M542 Cervicalgia: Secondary | ICD-10-CM

## 2023-02-21 NOTE — Therapy (Signed)
OUTPATIENT PHYSICAL THERAPY CERVICAL EVALUATION   Patient Name: Wanda Rivera MRN: [id number] DOB:September 23, 1941, 82 y.o., female Today's Date: 02/21/2023  END OF SESSION:  PT End of Session - 02/21/23 1046     Visit Number 1    Number of Visits 17    Date for PT Re-Evaluation 04/18/23    Authorization Type Medicare    PT Start Time 0935    PT Stop Time [id number]    PT Time Calculation (min) 65 min    Activity Tolerance Patient tolerated treatment well    Behavior During Therapy WFL for tasks assessed/performed             Past Medical History:  Diagnosis Date   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,hands   Breast cancer ([place])    1982, recurrence in 1985   Cataract    Bil   Glaucoma    Bil   Osteoporosis    Past Surgical History:  Procedure Laterality Date   ABDOMINAL HYSTERECTOMY     BREAST SURGERY     double masectomy for breast cancer   eye surgery Bilateral 06/05/2017   for Glaucoma   Patient Active Problem List   Diagnosis Date Noted   Neck swelling 01/01/2023   Chronic left shoulder pain 01/01/2023   High vitamin D level 01/01/2023   Neck pain 01/01/2023   Numbness of fingers of both hands 12/29/2021   Right sided abdominal pain 02/23/2020   Dysphagia 08/19/2019   Primary osteoarthritis of right knee 12/26/2018   Cough 12/19/2018   Constipation 12/19/2018   Rotator cuff arthropathy of right shoulder 04/02/2018   GERD (gastroesophageal reflux disease) 12/13/2017   History of breast cancer 01/02/2017   Compression fracture of L3 lumbar vertebra 05/04/2016   Hyperlipidemia 08/16/2015   Left carotid bruit 08/16/2015   Atherosclerotic peripheral vascular disease ([place]) 08/06/2015   Routine general medical examination at a health care facility 12/09/2014   Osteopenia 12/09/2014   Glaucoma 12/09/2014    PCP: Hoyt Koch, MD  REFERRING PROVIDER: Gregor Hams, MD   REFERRING DIAG:  M54.2 (ICD-10-CM) - Neck pain M25.511 (ICD-10-CM) - Pain in joint of right  shoulder  THERAPY DIAG:  Cervicalgia - Plan: PT plan of care cert/re-cert  Chronic right shoulder pain - Plan: PT plan of care cert/re-cert  Muscle weakness (generalized) - Plan: PT plan of care cert/re-cert  Rationale for Evaluation and Treatment: Rehabilitation  ONSET DATE: Chronic  SUBJECTIVE:  SUBJECTIVE STATEMENT: Pt presents to PT with reports of chronic neck and R shoulder pain. Notes that she felt a knot of nodule in the left side of her neck a few weeks ago and wanted to get it checked secondary to past hx of cancer. MD ruled most likely cause of muscle spasm as pt also has occasional neck pain and discomfort. Also notes long standing R shoulder pain, with pt being R hand dominant. She mostly notes pain with overhead reaching and lifting. She feels it is muscular and mainly feels like a pinch. Denies N/T down R UE.   PERTINENT HISTORY:  Breast Cancer, Osteoporosis   PAIN:  Are you having pain?  Yes: NPRS scale: 3/10 Worst: 10/10 Pain location: R anterior shoulder Pain description: achy Aggravating factors: reaching overhead Relieving factors: sleeve  Are you having pain?  No: NPRS scale: 0/10 Worst: 4/10 Pain location: neck - left upper trap Pain description: burning Aggravating factors: cleaning,  Relieving factors: icy hot  PRECAUTIONS: None  WEIGHT BEARING RESTRICTIONS: No  FALLS:  Has patient fallen in last 6 months? No  LIVING ENVIRONMENT: Lives with: lives with their family Lives in: House/apartment  OCCUPATION: Retired  PLOF: Independent  PATIENT GOALS: decrease neck and shoulder pain to improve comfort with home ADLs and community activities   OBJECTIVE:   DIAGNOSTIC FINDINGS:  See imaging  PATIENT SURVEYS:  FOTO: 61% function; 66%  predicted  COGNITION: Overall cognitive status: Within functional limits for tasks assessed  SENSATION: WFL  POSTURE: rounded shoulders and forward head  PALPATION: Slight TTP to L upper trap   CERVICAL ROM:   Active ROM A/PROM (deg) eval  Flexion   Extension   Right lateral flexion   Left lateral flexion   Right rotation 60  Left rotation 48   (Blank rows = not tested)  UPPER EXTREMITY ROM:  Active ROM Right eval Left eval  Shoulder flexion 65 WFL  Shoulder extension    Shoulder abduction 55 WFL  Shoulder adduction    Shoulder extension    Shoulder internal rotation L2 T8  Shoulder external rotation    Elbow flexion    Elbow extension    Wrist flexion    Wrist extension    Wrist ulnar deviation    Wrist radial deviation    Wrist pronation    Wrist supination     (Blank rows = not tested)  UPPER EXTREMITY MMT:  MMT Right eval Left eval  Shoulder flexion 3/5 4+/5  Shoulder extension    Shoulder abduction 3/5 4+/5  Shoulder adduction    Shoulder extension    Shoulder internal rotation 3/5 4+/5  Shoulder external rotation 3/5 4+/5  Middle trapezius    Lower trapezius    Elbow flexion    Elbow extension    Wrist flexion    Wrist extension    Wrist ulnar deviation    Wrist radial deviation    Wrist pronation    Wrist supination    Grip strength     (Blank rows = not tested)  CERVICAL SPECIAL TESTS:  Neck flexor muscle endurance test: 8 seconds  FUNCTIONAL TESTS:  N/A  TREATMENT: [facility] Adult PT Treatment:                                                DATE: 02/21/2023 Therapeutic Exercise: Upper trap stretch x  20" L Row x 5 RTB Shoulder IR/ER x 5 - 5" hold R table slide x 5   PATIENT EDUCATION:  Education details: eval findings, FOTO, HEP, POC Person educated: Patient Education method: Explanation, Demonstration, and Handouts Education comprehension: verbalized understanding and returned demonstration  HOME EXERCISE  PROGRAM: Access Code: [email or web address] URL: [email or web address] Date: 02/21/2023 Prepared by: Octavio Manns  Exercises - Seated Upper Trapezius Stretch  - 1 x daily - 7 x weekly - 2 reps - 30 sec hold - Standing Shoulder Row with Anchored Resistance  - 1 x daily - 7 x weekly - 3 sets - 10 reps - red band hold - Standing Isometric Shoulder Internal Rotation with Towel Roll at Doorway  - 1 x daily - 7 x weekly - 2 sets - 10 reps - 3-5 sec hold - Standing Isometric Shoulder External Rotation with Doorway and Towel Roll  - 1 x daily - 7 x weekly - 2 sets - 10 reps - 3-5 hold hold - Seated Shoulder Flexion Towel Slide at Table Top  - 1 x daily - 7 x weekly - 2 sets - 10 reps - 5 sec hold  ASSESSMENT:  CLINICAL IMPRESSION: Patient is a 82 y.o. F who was seen today for physical therapy evaluation and treatment for chronic neck and shoulder pain. Physical findings are consistent with MD impression as pt has decrease in shoulder/cervical ROM as well as decrease in R shoulder strength. Her FOTO score indicates decrease in functional ability below PLOF. She would benefit from skilled PT services working on improving strength and ROM in order to decrease neck and shoulder pain.   OBJECTIVE IMPAIRMENTS: decreased activity tolerance, decreased mobility, decreased ROM, decreased strength, impaired UE functional use, and pain.   ACTIVITY LIMITATIONS: carrying, lifting, reach over head, and caring for others  PARTICIPATION LIMITATIONS: meal prep, cleaning, laundry, driving, shopping, community activity, and yard work  PERSONAL FACTORS: Time since onset of injury/illness/exacerbation and 1-2 comorbidities: Breast Cancer, Osteoporosis   are also affecting patient's functional outcome.   REHAB POTENTIAL: Good  CLINICAL DECISION MAKING: Stable/uncomplicated  EVALUATION COMPLEXITY: Low   GOALS: Goals reviewed with patient? No  SHORT TERM GOALS: Target date: 03/14/2023   Pt will be compliant and  knowledgeable with initial HEP for improved comfort and carryover Baseline: initial HEP given  Goal status: INITIAL  2.  Pt will self report neck and shoulder pain no greater than 6/10 for improved comfort and functional ability Baseline: 10/10 at worst Goal status: INITIAL   LONG TERM GOALS: Target date: 04/18/2023   Pt will improve FOTO function score to no less than 66% as proxy for functional improvement Baseline: 61% function Goal status: INITIAL   2.  Pt will self report neck and shoulder pain no greater than 2-3/10 for improved comfort and functional ability Baseline: 10/10 at worst Goal status: INITIAL   3.  Pt will improve R shoulder flex/abd to no less than 120 for improved comfort and functional ability with home ADLs and overhead reaching Baseline: see chart Goal status: INITIAL  4.  Pt will improve left cervical rotation to no less than 60 for improved comfort and function with driving Baseline: 48 Goal status: INITIAL    PLAN:  PT FREQUENCY: 2x/week  PT DURATION: 8 weeks  PLANNED INTERVENTIONS: Therapeutic exercises, Therapeutic activity, Neuromuscular re-education, Balance training, Gait training, Patient/Family education, Self Care, Joint mobilization, Aquatic Therapy, Dry Needling, Electrical stimulation, Cryotherapy, Moist heat, Vasopneumatic device, Manual therapy, and Re-evaluation  PLAN FOR NEXT  SESSION: assess HEP response, progress DNF/periscapular/RTC strength, manual for decreasing neck pain   Ward Chatters, PT 02/21/2023, 10:50 AM

## 2023-03-06 ENCOUNTER — Ambulatory Visit: Payer: Medicare Other | Attending: Internal Medicine

## 2023-03-06 DIAGNOSIS — M25511 Pain in right shoulder: Secondary | ICD-10-CM | POA: Insufficient documentation

## 2023-03-06 DIAGNOSIS — M6281 Muscle weakness (generalized): Secondary | ICD-10-CM | POA: Insufficient documentation

## 2023-03-06 DIAGNOSIS — G8929 Other chronic pain: Secondary | ICD-10-CM

## 2023-03-06 DIAGNOSIS — M542 Cervicalgia: Secondary | ICD-10-CM | POA: Diagnosis not present

## 2023-03-06 NOTE — Therapy (Signed)
OUTPATIENT PHYSICAL THERAPY TREATMENT NOTE   Patient Name: Wanda Rivera MRN: [id number] DOB:Jan 22, 1941, 82 y.o., female Today's Date: 03/06/2023  PCP: Hoyt Koch, MD  REFERRING PROVIDER: Gregor Hams, MD    END OF SESSION:   PT End of Session - 03/06/23 1041     Visit Number 2    Number of Visits 17    Date for PT Re-Evaluation 04/18/23    Authorization Type Medicare    PT Start Time [id number]    PT Stop Time 1125    PT Time Calculation (min) 40 min    Activity Tolerance Patient tolerated treatment well    Behavior During Therapy WFL for tasks assessed/performed             Past Medical History:  Diagnosis Date   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,hands   Breast cancer ([place])    1982, recurrence in 1985   Cataract    Bil   Glaucoma    Bil   Osteoporosis    Past Surgical History:  Procedure Laterality Date   ABDOMINAL HYSTERECTOMY     BREAST SURGERY     double masectomy for breast cancer   eye surgery Bilateral 06/05/2017   for Glaucoma   Patient Active Problem List   Diagnosis Date Noted   Neck swelling 01/01/2023   Chronic left shoulder pain 01/01/2023   High vitamin D level 01/01/2023   Neck pain 01/01/2023   Numbness of fingers of both hands 12/29/2021   Right sided abdominal pain 02/23/2020   Dysphagia 08/19/2019   Primary osteoarthritis of right knee 12/26/2018   Cough 12/19/2018   Constipation 12/19/2018   Rotator cuff arthropathy of right shoulder 04/02/2018   GERD (gastroesophageal reflux disease) 12/13/2017   History of breast cancer 01/02/2017   Compression fracture of L3 lumbar vertebra 05/04/2016   Hyperlipidemia 08/16/2015   Left carotid bruit 08/16/2015   Atherosclerotic peripheral vascular disease ([place]) 08/06/2015   Routine general medical examination at a health care facility 12/09/2014   Osteopenia 12/09/2014   Glaucoma 12/09/2014    REFERRING DIAG: M54.2 (ICD-10-CM) - Neck pain M25.511 (ICD-10-CM) - Pain in joint of right  shoulder  THERAPY DIAG:  Cervicalgia  Chronic right shoulder pain  Muscle weakness (generalized)  Rationale for Evaluation and Treatment Rehabilitation  PERTINENT HISTORY: Breast Cancer, Osteoporosis   PRECAUTIONS: None  SUBJECTIVE:                                                                                                                                                                                      SUBJECTIVE STATEMENT:  Patient reports HEP compliance, stating that she feels like the  exercises are helping her pain.    PAIN:  Are you having pain?  Yes: NPRS scale: 2/10 Worst: 10/10 Pain location: R anterior shoulder Pain description: achy Aggravating factors: reaching overhead Relieving factors: sleeve   Are you having pain?  No: NPRS scale: 2/10 Worst: 4/10 Pain location: neck - left upper trap Pain description: burning Aggravating factors: cleaning,  Relieving factors: icy hot   OBJECTIVE: (objective measures completed at initial evaluation unless otherwise dated)   DIAGNOSTIC FINDINGS:  See imaging   PATIENT SURVEYS:  FOTO: 61% function; 66% predicted   COGNITION: Overall cognitive status: Within functional limits for tasks assessed   SENSATION: WFL   POSTURE: rounded shoulders and forward head   PALPATION: Slight TTP to L upper trap     CERVICAL ROM:    Active ROM A/PROM (deg) eval  Flexion    Extension    Right lateral flexion    Left lateral flexion    Right rotation 60  Left rotation 48   (Blank rows = not tested)   UPPER EXTREMITY ROM:   Active ROM Right eval Left eval  Shoulder flexion 65 WFL  Shoulder extension      Shoulder abduction 55 WFL  Shoulder adduction      Shoulder extension      Shoulder internal rotation L2 T8  Shoulder external rotation      Elbow flexion      Elbow extension      Wrist flexion      Wrist extension      Wrist ulnar deviation      Wrist radial deviation      Wrist pronation       Wrist supination       (Blank rows = not tested)   UPPER EXTREMITY MMT:   MMT Right eval Left eval  Shoulder flexion 3/5 4+/5  Shoulder extension      Shoulder abduction 3/5 4+/5  Shoulder adduction      Shoulder extension      Shoulder internal rotation 3/5 4+/5  Shoulder external rotation 3/5 4+/5  Middle trapezius      Lower trapezius      Elbow flexion      Elbow extension      Wrist flexion      Wrist extension      Wrist ulnar deviation      Wrist radial deviation      Wrist pronation      Wrist supination      Grip strength       (Blank rows = not tested)   CERVICAL SPECIAL TESTS:  Neck flexor muscle endurance test: 8 seconds   FUNCTIONAL TESTS:  N/A   TREATMENT: [facility] Adult PT Treatment:                                                DATE: 03/06/23 Therapeutic Exercise: UBE level 1 x 6 mins 3'/3' fwd/bwd Rows RTB 2x10 Shoulder extension YTB 2x10 ER/IR Rt YTB x10 each Standing Rt pball slide flexion/abduction 2x10 each Upper trap stretch 2x30" BIL Levator scap stretch 2x30" Manual: STM BIL upper traps, cervical paraspinals Positional release BIL upper trap  [facility] Adult PT Treatment:  DATE: 02/21/2023 Therapeutic Exercise: Upper trap stretch x 20" L Row x 5 RTB Shoulder IR/ER x 5 - 5" hold R table slide x 5    PATIENT EDUCATION:  Education details: eval findings, FOTO, HEP, POC Person educated: Patient Education method: Explanation, Demonstration, and Handouts Education comprehension: verbalized understanding and returned demonstration   HOME EXERCISE PROGRAM: Access Code: [email or web address] URL: [email or web address] Date: 02/21/2023 Prepared by: Octavio Manns   Exercises - Seated Upper Trapezius Stretch  - 1 x daily - 7 x weekly - 2 reps - 30 sec hold - Standing Shoulder Row with Anchored Resistance  - 1 x daily - 7 x weekly - 3 sets - 10 reps - red band hold - Standing Isometric Shoulder  Internal Rotation with Towel Roll at Doorway  - 1 x daily - 7 x weekly - 2 sets - 10 reps - 3-5 sec hold - Standing Isometric Shoulder External Rotation with Doorway and Towel Roll  - 1 x daily - 7 x weekly - 2 sets - 10 reps - 3-5 hold hold - Seated Shoulder Flexion Towel Slide at Table Top  - 1 x daily - 7 x weekly - 2 sets - 10 reps - 5 sec hold   ASSESSMENT:   CLINICAL IMPRESSION: Patient presents to first follow up PT session reporting continued, though lessened, pain in her neck and Rt shoulder and reports HEP compliance. Session today focused on RTC and periscapular strengthening as well as stretching for upper traps and levator scap. Patient was able to tolerate all prescribed exercises with no adverse effects. Patient continues to benefit from skilled PT services and should be progressed as able to improve functional independence.     OBJECTIVE IMPAIRMENTS: decreased activity tolerance, decreased mobility, decreased ROM, decreased strength, impaired UE functional use, and pain.    ACTIVITY LIMITATIONS: carrying, lifting, reach over head, and caring for others   PARTICIPATION LIMITATIONS: meal prep, cleaning, laundry, driving, shopping, community activity, and yard work   PERSONAL FACTORS: Time since onset of injury/illness/exacerbation and 1-2 comorbidities: Breast Cancer, Osteoporosis   are also affecting patient's functional outcome.    REHAB POTENTIAL: Good   CLINICAL DECISION MAKING: Stable/uncomplicated   EVALUATION COMPLEXITY: Low     GOALS: Goals reviewed with patient? No   SHORT TERM GOALS: Target date: 03/14/2023   Pt will be compliant and knowledgeable with initial HEP for improved comfort and carryover Baseline: initial HEP given  Goal status: INITIAL   2.  Pt will self report neck and shoulder pain no greater than 6/10 for improved comfort and functional ability Baseline: 10/10 at worst Goal status: INITIAL    LONG TERM GOALS: Target date: 04/18/2023   Pt  will improve FOTO function score to no less than 66% as proxy for functional improvement Baseline: 61% function Goal status: INITIAL    2.  Pt will self report neck and shoulder pain no greater than 2-3/10 for improved comfort and functional ability Baseline: 10/10 at worst Goal status: INITIAL    3.  Pt will improve R shoulder flex/abd to no less than 120 for improved comfort and functional ability with home ADLs and overhead reaching Baseline: see chart Goal status: INITIAL   4.  Pt will improve left cervical rotation to no less than 60 for improved comfort and function with driving Baseline: 48 Goal status: INITIAL       PLAN:   PT FREQUENCY: 2x/week   PT DURATION: 8 weeks   PLANNED INTERVENTIONS: Therapeutic exercises, Therapeutic activity,  Neuromuscular re-education, Balance training, Gait training, Patient/Family education, Self Care, Joint mobilization, Aquatic Therapy, Dry Needling, Electrical stimulation, Cryotherapy, Moist heat, Vasopneumatic device, Manual therapy, and Re-evaluation   PLAN FOR NEXT SESSION: assess HEP response, progress DNF/periscapular/RTC strength, manual for decreasing neck pain   Margarette Canada, PTA 03/06/2023, 11:24 AM

## 2023-03-07 NOTE — Therapy (Signed)
OUTPATIENT PHYSICAL THERAPY TREATMENT NOTE   Patient Name: Wanda Rivera MRN: [id number] DOB:01-27-1941, 82 y.o., female Today's Date: 03/08/2023  PCP: Hoyt Koch, MD  REFERRING PROVIDER: Gregor Hams, MD    END OF SESSION:   PT End of Session - 03/08/23 1001     Visit Number 3    Number of Visits 17    Date for PT Re-Evaluation 04/18/23    Authorization Type Medicare    PT Start Time 1000    PT Stop Time [id number]    PT Time Calculation (min) 40 min    Activity Tolerance Patient tolerated treatment well    Behavior During Therapy WFL for tasks assessed/performed             Past Medical History:  Diagnosis Date   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,hands   Breast cancer ([place])    1982, recurrence in 1985   Cataract    Bil   Glaucoma    Bil   Osteoporosis    Past Surgical History:  Procedure Laterality Date   ABDOMINAL HYSTERECTOMY     BREAST SURGERY     double masectomy for breast cancer   eye surgery Bilateral 06/05/2017   for Glaucoma   Patient Active Problem List   Diagnosis Date Noted   Neck swelling 01/01/2023   Chronic left shoulder pain 01/01/2023   High vitamin D level 01/01/2023   Neck pain 01/01/2023   Numbness of fingers of both hands 12/29/2021   Right sided abdominal pain 02/23/2020   Dysphagia 08/19/2019   Primary osteoarthritis of right knee 12/26/2018   Cough 12/19/2018   Constipation 12/19/2018   Rotator cuff arthropathy of right shoulder 04/02/2018   GERD (gastroesophageal reflux disease) 12/13/2017   History of breast cancer 01/02/2017   Compression fracture of L3 lumbar vertebra 05/04/2016   Hyperlipidemia 08/16/2015   Left carotid bruit 08/16/2015   Atherosclerotic peripheral vascular disease ([place]) 08/06/2015   Routine general medical examination at a health care facility 12/09/2014   Osteopenia 12/09/2014   Glaucoma 12/09/2014    REFERRING DIAG: M54.2 (ICD-10-CM) - Neck pain M25.511 (ICD-10-CM) - Pain in joint of right  shoulder  THERAPY DIAG:  Cervicalgia  Chronic right shoulder pain  Muscle weakness (generalized)  Rationale for Evaluation and Treatment Rehabilitation  PERTINENT HISTORY: Breast Cancer, Osteoporosis   PRECAUTIONS: None  SUBJECTIVE:                                                                                                                                                                                      SUBJECTIVE STATEMENT:  Minimal pain to report today.     PAIN:  Are you having pain?  Yes: NPRS scale: 2/10 Worst: 10/10 Pain location: R anterior shoulder Pain description: achy Aggravating factors: reaching overhead Relieving factors: sleeve   Are you having pain?  No: NPRS scale: 2/10 Worst: 4/10 Pain location: neck - left upper trap Pain description: burning Aggravating factors: cleaning,  Relieving factors: icy hot   OBJECTIVE: (objective measures completed at initial evaluation unless otherwise dated)   DIAGNOSTIC FINDINGS:  See imaging   PATIENT SURVEYS:  FOTO: 61% function; 66% predicted   COGNITION: Overall cognitive status: Within functional limits for tasks assessed   SENSATION: WFL   POSTURE: rounded shoulders and forward head   PALPATION: Slight TTP to L upper trap     CERVICAL ROM:    Active ROM A/PROM (deg) eval  Flexion    Extension    Right lateral flexion    Left lateral flexion    Right rotation 60  Left rotation 48   (Blank rows = not tested)   UPPER EXTREMITY ROM:   Active ROM Right eval Left eval  Shoulder flexion 65 WFL  Shoulder extension      Shoulder abduction 55 WFL  Shoulder adduction      Shoulder extension      Shoulder internal rotation L2 T8  Shoulder external rotation      Elbow flexion      Elbow extension      Wrist flexion      Wrist extension      Wrist ulnar deviation      Wrist radial deviation      Wrist pronation      Wrist supination       (Blank rows = not tested)   UPPER  EXTREMITY MMT:   MMT Right eval Left eval  Shoulder flexion 3/5 4+/5  Shoulder extension      Shoulder abduction 3/5 4+/5  Shoulder adduction      Shoulder extension      Shoulder internal rotation 3/5 4+/5  Shoulder external rotation 3/5 4+/5  Middle trapezius      Lower trapezius      Elbow flexion      Elbow extension      Wrist flexion      Wrist extension      Wrist ulnar deviation      Wrist radial deviation      Wrist pronation      Wrist supination      Grip strength       (Blank rows = not tested)   CERVICAL SPECIAL TESTS:  Neck flexor muscle endurance test: 8 seconds   FUNCTIONAL TESTS:  N/A   TREATMENT: [facility] Adult PT Treatment:                                                DATE: 03/08/23 Therapeutic Exercise: UBE level 1 x 6 mins 3'/3' fwd/bwd Hor abduction YTB 15x Seated ER YTB 15x Scaption on wall with towel 10x(limited by supraspinatus weakness and subsequent substitution pattern) Upper trap stretch 2x30" L Levator scap stretch 2x30" L Manual Therapy: L first rib inferior during L shoulder flexion 10x Manual L scalene stretch 30s x3 STM to L upper trap while discussing TPDN technique   [facility] Adult PT Treatment:  DATE: 03/06/23 Therapeutic Exercise: UBE level 1 x 6 mins 3'/3' fwd/bwd Rows RTB 2x10 Shoulder extension YTB 2x10 ER/IR Rt YTB x10 each Standing Rt pball slide flexion/abduction 2x10 each Upper trap stretch 2x30" BIL Levator scap stretch 2x30" Manual: STM BIL upper traps, cervical paraspinals Positional release BIL upper trap  [facility] Adult PT Treatment:                                                DATE: 02/21/2023 Therapeutic Exercise: Upper trap stretch x 20" L Row x 5 RTB Shoulder IR/ER x 5 - 5" hold R table slide x 5    PATIENT EDUCATION:  Education details: eval findings, FOTO, HEP, POC Person educated: Patient Education method: Explanation, Demonstration, and Handouts Education  comprehension: verbalized understanding and returned demonstration   HOME EXERCISE PROGRAM: Access Code: [email or web address] URL: [email or web address] Date: 02/21/2023 Prepared by: Octavio Manns   Exercises - Seated Upper Trapezius Stretch  - 1 x daily - 7 x weekly - 2 reps - 30 sec hold - Standing Shoulder Row with Anchored Resistance  - 1 x daily - 7 x weekly - 3 sets - 10 reps - red band hold - Standing Isometric Shoulder Internal Rotation with Towel Roll at Doorway  - 1 x daily - 7 x weekly - 2 sets - 10 reps - 3-5 sec hold - Standing Isometric Shoulder External Rotation with Doorway and Towel Roll  - 1 x daily - 7 x weekly - 2 sets - 10 reps - 3-5 hold hold - Seated Shoulder Flexion Towel Slide at Table Top  - 1 x daily - 7 x weekly - 2 sets - 10 reps - 5 sec hold   ASSESSMENT:   CLINICAL IMPRESSION: Treatment focus was L upper trap and levator stretch, posterior shoulder strengthening, postural training, aerobic work,  Addressed tight L scalenes and R supraspinatus weakness, manual techniques applied to L cervical and L shoulder.  Discussed TPDN and issued handout.  Marked soft tissue restrictions identified all around with R supraspinatus strength and endurance deficits noted.    OBJECTIVE IMPAIRMENTS: decreased activity tolerance, decreased mobility, decreased ROM, decreased strength, impaired UE functional use, and pain.    ACTIVITY LIMITATIONS: carrying, lifting, reach over head, and caring for others   PARTICIPATION LIMITATIONS: meal prep, cleaning, laundry, driving, shopping, community activity, and yard work   PERSONAL FACTORS: Time since onset of injury/illness/exacerbation and 1-2 comorbidities: Breast Cancer, Osteoporosis   are also affecting patient's functional outcome.    REHAB POTENTIAL: Good   CLINICAL DECISION MAKING: Stable/uncomplicated   EVALUATION COMPLEXITY: Low     GOALS: Goals reviewed with patient? No   SHORT TERM GOALS: Target date: 03/14/2023    Pt will be compliant and knowledgeable with initial HEP for improved comfort and carryover Baseline: initial HEP given  Goal status: INITIAL   2.  Pt will self report neck and shoulder pain no greater than 6/10 for improved comfort and functional ability Baseline: 10/10 at worst Goal status: INITIAL    LONG TERM GOALS: Target date: 04/18/2023   Pt will improve FOTO function score to no less than 66% as proxy for functional improvement Baseline: 61% function Goal status: INITIAL    2.  Pt will self report neck and shoulder pain no greater than 2-3/10 for improved comfort and functional ability Baseline: 10/10 at worst Goal status:  INITIAL    3.  Pt will improve R shoulder flex/abd to no less than 120 for improved comfort and functional ability with home ADLs and overhead reaching Baseline: see chart Goal status: INITIAL   4.  Pt will improve left cervical rotation to no less than 60 for improved comfort and function with driving Baseline: 48 Goal status: INITIAL       PLAN:   PT FREQUENCY: 2x/week   PT DURATION: 8 weeks   PLANNED INTERVENTIONS: Therapeutic exercises, Therapeutic activity, Neuromuscular re-education, Balance training, Gait training, Patient/Family education, Self Care, Joint mobilization, Aquatic Therapy, Dry Needling, Electrical stimulation, Cryotherapy, Moist heat, Vasopneumatic device, Manual therapy, and Re-evaluation   PLAN FOR NEXT SESSION: assess HEP response, progress DNF/periscapular/RTC strength, manual for decreasing neck pain   Lanice Shirts, PT 03/08/2023, 11:00 AM

## 2023-03-08 ENCOUNTER — Ambulatory Visit: Payer: Medicare Other

## 2023-03-08 DIAGNOSIS — M542 Cervicalgia: Secondary | ICD-10-CM | POA: Diagnosis not present

## 2023-03-08 DIAGNOSIS — G8929 Other chronic pain: Secondary | ICD-10-CM | POA: Diagnosis not present

## 2023-03-08 DIAGNOSIS — M6281 Muscle weakness (generalized): Secondary | ICD-10-CM

## 2023-03-08 DIAGNOSIS — M25511 Pain in right shoulder: Secondary | ICD-10-CM | POA: Diagnosis not present

## 2023-03-12 ENCOUNTER — Ambulatory Visit: Payer: Medicare Other

## 2023-03-13 ENCOUNTER — Ambulatory Visit (INDEPENDENT_AMBULATORY_CARE_PROVIDER_SITE_OTHER): Payer: Medicare Other

## 2023-03-13 ENCOUNTER — Ambulatory Visit (INDEPENDENT_AMBULATORY_CARE_PROVIDER_SITE_OTHER): Payer: Medicare Other | Admitting: Family Medicine

## 2023-03-13 VITALS — BP 116/70 | HR 48 | Ht 64.0 in | Wt 134.0 lb

## 2023-03-13 DIAGNOSIS — M542 Cervicalgia: Secondary | ICD-10-CM | POA: Diagnosis not present

## 2023-03-13 DIAGNOSIS — M19011 Primary osteoarthritis, right shoulder: Secondary | ICD-10-CM | POA: Diagnosis not present

## 2023-03-13 DIAGNOSIS — M25511 Pain in right shoulder: Secondary | ICD-10-CM

## 2023-03-13 DIAGNOSIS — G8929 Other chronic pain: Secondary | ICD-10-CM | POA: Diagnosis not present

## 2023-03-13 DIAGNOSIS — M12811 Other specific arthropathies, not elsewhere classified, right shoulder: Secondary | ICD-10-CM | POA: Diagnosis not present

## 2023-03-13 NOTE — Patient Instructions (Addendum)
Thank you for coming in today.   Keep working on PT and your home exercises  Please get an Xray today before you leave   If not better after you finish PT, call the office and let me know, and we can do a MRI

## 2023-03-13 NOTE — Therapy (Signed)
OUTPATIENT PHYSICAL THERAPY TREATMENT NOTE   Patient Name: Wanda Rivera MRN: [id number] DOB:[birth year], 82 y.o., female Today's Date: 03/14/2023  PCP: Hoyt Koch, MD  REFERRING PROVIDER: Gregor Hams, MD    END OF SESSION:   PT End of Session - 03/14/23 1039     Visit Number 4    Number of Visits 17    Date for PT Re-Evaluation 04/18/23    Authorization Type Medicare    PT Start Time [id number]    PT Stop Time 1125    PT Time Calculation (min) 40 min    Activity Tolerance Patient tolerated treatment well    Behavior During Therapy WFL for tasks assessed/performed              Past Medical History:  Diagnosis Date   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,hands   Breast cancer ([place])    1982, recurrence in 1985   Cataract    Bil   Glaucoma    Bil   Osteoporosis    Past Surgical History:  Procedure Laterality Date   ABDOMINAL HYSTERECTOMY     BREAST SURGERY     double masectomy for breast cancer   eye surgery Bilateral 06/05/2017   for Glaucoma   Patient Active Problem List   Diagnosis Date Noted   Pain in joint of right shoulder 03/13/2023   Neck swelling 01/01/2023   High vitamin D level 01/01/2023   Chronic neck pain 01/01/2023   Numbness of fingers of both hands 12/29/2021   Right sided abdominal pain 02/23/2020   Dysphagia 08/19/2019   Primary osteoarthritis of right knee 12/26/2018   Cough 12/19/2018   Constipation 12/19/2018   Rotator cuff arthropathy of right shoulder 04/02/2018   GERD (gastroesophageal reflux disease) 12/13/2017   History of breast cancer 01/02/2017   Compression fracture of L3 lumbar vertebra 05/04/2016   Hyperlipidemia 08/16/2015   Left carotid bruit 08/16/2015   Atherosclerotic peripheral vascular disease ([place]) 08/06/2015   Routine general medical examination at a health care facility 12/09/2014   Osteopenia 12/09/2014   Glaucoma 12/09/2014    REFERRING DIAG: M54.2 (ICD-10-CM) - Neck pain M25.511 (ICD-10-CM) - Pain in joint  of right shoulder  THERAPY DIAG:  Cervicalgia  Chronic right shoulder pain  Muscle weakness (generalized)  Rationale for Evaluation and Treatment Rehabilitation  PERTINENT HISTORY: Breast Cancer, Osteoporosis   PRECAUTIONS: None  SUBJECTIVE:                                                                                                                                                                                      SUBJECTIVE STATEMENT:  Minimal pain to report today. Saw  MD yesterday who suggested TPDN, she is a bit apprehensive about this, but is opening to trying it.    PAIN:  Are you having pain?  Yes: NPRS scale: 0/10 Worst: 10/10 Pain location: R anterior shoulder Pain description: achy Aggravating factors: reaching overhead Relieving factors: sleeve   Are you having pain?  No: NPRS scale: 2/10 Worst: 4/10 Pain location: neck - left upper trap Pain description: burning Aggravating factors: cleaning,  Relieving factors: icy hot   OBJECTIVE: (objective measures completed at initial evaluation unless otherwise dated)   DIAGNOSTIC FINDINGS:  See imaging   PATIENT SURVEYS:  FOTO: 61% function; 66% predicted   COGNITION: Overall cognitive status: Within functional limits for tasks assessed   SENSATION: WFL   POSTURE: rounded shoulders and forward head   PALPATION: Slight TTP to L upper trap     CERVICAL ROM:    Active ROM A/PROM (deg) eval  Flexion    Extension    Right lateral flexion    Left lateral flexion    Right rotation 60  Left rotation 48   (Blank rows = not tested)   UPPER EXTREMITY ROM:   Active ROM Right eval Left eval  Shoulder flexion 65 WFL  Shoulder extension      Shoulder abduction 55 WFL  Shoulder adduction      Shoulder extension      Shoulder internal rotation L2 T8  Shoulder external rotation      Elbow flexion      Elbow extension      Wrist flexion      Wrist extension      Wrist ulnar deviation       Wrist radial deviation      Wrist pronation      Wrist supination       (Blank rows = not tested)   UPPER EXTREMITY MMT:   MMT Right eval Left eval  Shoulder flexion 3/5 4+/5  Shoulder extension      Shoulder abduction 3/5 4+/5  Shoulder adduction      Shoulder extension      Shoulder internal rotation 3/5 4+/5  Shoulder external rotation 3/5 4+/5  Middle trapezius      Lower trapezius      Elbow flexion      Elbow extension      Wrist flexion      Wrist extension      Wrist ulnar deviation      Wrist radial deviation      Wrist pronation      Wrist supination      Grip strength       (Blank rows = not tested)   CERVICAL SPECIAL TESTS:  Neck flexor muscle endurance test: 8 seconds   FUNCTIONAL TESTS:  N/A   TREATMENT: [facility] Adult PT Treatment:                                                DATE: 03/13/23 Therapeutic Exercise: UBE level 1 x 6 mins 3'/3' fwd/bwd Hor abduction YTB 15x Diagonals YTB Lt only  Rows RTB 2x10 Seated ER YTB 2x10 Upper trap stretch 2x30" BIL Manual: STM BIL upper traps, cervical paraspinals Positional release BIL upper trap   [facility] Adult PT Treatment:  DATE: 03/08/23 Therapeutic Exercise: UBE level 1 x 6 mins 3'/3' fwd/bwd Hor abduction YTB 15x Seated ER YTB 15x Scaption on wall with towel 10x(limited by supraspinatus weakness and subsequent substitution pattern) Upper trap stretch 2x30" L Levator scap stretch 2x30" L Manual Therapy: L first rib inferior during L shoulder flexion 10x Manual L scalene stretch 30s x3 STM to L upper trap while discussing TPDN technique   [facility] Adult PT Treatment:                                                DATE: 03/06/23 Therapeutic Exercise: UBE level 1 x 6 mins 3'/3' fwd/bwd Rows RTB 2x10 Shoulder extension YTB 2x10 ER/IR Rt YTB x10 each Standing Rt pball slide flexion/abduction 2x10 each Upper trap stretch 2x30" BIL Levator scap stretch  2x30" Manual: STM BIL upper traps, cervical paraspinals Positional release BIL upper trap     PATIENT EDUCATION:  Education details: eval findings, FOTO, HEP, POC Person educated: Patient Education method: Explanation, Demonstration, and Handouts Education comprehension: verbalized understanding and returned demonstration   HOME EXERCISE PROGRAM: Access Code: [email or web address] URL: [email or web address] Date: 02/21/2023 Prepared by: Octavio Manns   Exercises - Seated Upper Trapezius Stretch  - 1 x daily - 7 x weekly - 2 reps - 30 sec hold - Standing Shoulder Row with Anchored Resistance  - 1 x daily - 7 x weekly - 3 sets - 10 reps - red band hold - Standing Isometric Shoulder Internal Rotation with Towel Roll at Doorway  - 1 x daily - 7 x weekly - 2 sets - 10 reps - 3-5 sec hold - Standing Isometric Shoulder External Rotation with Doorway and Towel Roll  - 1 x daily - 7 x weekly - 2 sets - 10 reps - 3-5 hold hold - Seated Shoulder Flexion Towel Slide at Table Top  - 1 x daily - 7 x weekly - 2 sets - 10 reps - 5 sec hold   ASSESSMENT:   CLINICAL IMPRESSION:  Patient presents to PT reporting decreased overall pain today and reports HEP compliance. Patient is interested in TPDN next session, does have some reservations, but is open to trying it out. Session today continued to focus on gentle RTC and periscapular strengthening as well as manual techniques to decrease tension. Patient was able to tolerate all prescribed exercises with no adverse effects. Patient continues to benefit from skilled PT services and should be progressed as able to improve functional independence.     OBJECTIVE IMPAIRMENTS: decreased activity tolerance, decreased mobility, decreased ROM, decreased strength, impaired UE functional use, and pain.    ACTIVITY LIMITATIONS: carrying, lifting, reach over head, and caring for others   PARTICIPATION LIMITATIONS: meal prep, cleaning, laundry, driving, shopping,  community activity, and yard work   PERSONAL FACTORS: Time since onset of injury/illness/exacerbation and 1-2 comorbidities: Breast Cancer, Osteoporosis   are also affecting patient's functional outcome.    REHAB POTENTIAL: Good   CLINICAL DECISION MAKING: Stable/uncomplicated   EVALUATION COMPLEXITY: Low     GOALS: Goals reviewed with patient? No   SHORT TERM GOALS: Target date: 03/14/2023   Pt will be compliant and knowledgeable with initial HEP for improved comfort and carryover Baseline: initial HEP given  Goal status: MET Pt reports adherence 03/14/23   2.  Pt will self report neck and shoulder pain no greater than 6/10  for improved comfort and functional ability Baseline: 10/10 at worst Goal status: MET 03/14/23: Patient reports 3/10 at [place]: Target date: 04/18/2023   Pt will improve FOTO function score to no less than 66% as proxy for functional improvement Baseline: 61% function Goal status: INITIAL    2.  Pt will self report neck and shoulder pain no greater than 2-3/10 for improved comfort and functional ability Baseline: 10/10 at worst Goal status: INITIAL    3.  Pt will improve R shoulder flex/abd to no less than 120 for improved comfort and functional ability with home ADLs and overhead reaching Baseline: see chart Goal status: INITIAL   4.  Pt will improve left cervical rotation to no less than 60 for improved comfort and function with driving Baseline: 48 Goal status: INITIAL       PLAN:   PT FREQUENCY: 2x/week   PT DURATION: 8 weeks   PLANNED INTERVENTIONS: Therapeutic exercises, Therapeutic activity, Neuromuscular re-education, Balance training, Gait training, Patient/Family education, Self Care, Joint mobilization, Aquatic Therapy, Dry Needling, Electrical stimulation, Cryotherapy, Moist heat, Vasopneumatic device, Manual therapy, and Re-evaluation   PLAN FOR NEXT SESSION: assess HEP response, progress DNF/periscapular/RTC  strength, manual for decreasing neck pain   Margarette Canada, PTA 03/14/2023, 11:24 AM

## 2023-03-13 NOTE — Progress Notes (Signed)
Wanda Goltz, PhD, LAT, ATC acting as a scribe for Lynne Leader, MD.  Wanda Rivera is a 82 y.o. female who presents to [facility] today for 6-wk f/u R thumb and neck pain. Pt was last seen by Dr. Georgina Snell on 01/30/23 and was given a R trigger thumb steroid injection and was referred to PT, completing 3 visits. Today, pt reports R thumb is feeling pretty good. Pt feels like PT is making progress w/ her neck pain and her pain has improved. Pt has been compliant w/ HEP. Pt c/o R shoulder pain that she is also working w/ PT on, but feels improvement.  She wonders if dry needling could help her neck.  The physical therapist are recommending it but she has questions.  She has a few more weeks of physical therapy scheduled. Dominant symptoms are left lateral neck pain and right shoulder pain.  Dx imaging: 01/04/2023 C-spine x-ray   Pertinent review of systems: No fevers or chills  Relevant historical information: History of breast cancer   Exam:  BP 116/70   Pulse (!) 48   Ht '5\' 4"'$  (1.626 m)   Wt 134 lb (60.8 kg)   SpO2 99%   BMI 23.00 kg/m  General: Well Developed, well nourished, and in no acute distress.   MSK: C-spine: Normal appearing Decreased cervical motion.  Tender palpation left lateral cervical spine and trapezius.  Right shoulder normal appearing decreased motion.    Lab and Radiology Results  X-ray images right shoulder obtained today personal and independently interpreted. High riding humeral head with mild glenohumeral degenerative changes.  No acute fractures are visible.  Surgical clips present in the right axilla.  No aggressive appearing lesions are visible. Await formal radiology review  EXAM: CERVICAL SPINE - COMPLETE 4+ VIEW   COMPARISON:  September 01, 2014   FINDINGS: There is no evidence of cervical spine fracture or prevertebral soft tissue swelling. Alignment is normal. Fusion of C5-6 unchanged. Multilevel  degenerative joint changes with narrowed joint space, osteophyte formation identified slightly worsened compared prior exam.   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     Electronically Signed   By: Abelardo Diesel M.D.   On: 01/04/2023 13:27 I, Lynne Leader, personally (independently) visualized and performed the interpretation of the images attached in this note.    Assessment and Plan: 82 y.o. female with  Chronic left-sided neck pain.  This occurs in the setting of cervical spine degenerative changes.  She has had significant improvement with physical therapy but still has remaining pain. I do think that dry needling would be quite helpful.  We spent some time talking about what dry needling as how it works and how much it hurts versus how much it could help.  I think it is a good idea to try. Next step for her left cervical neck pain if not better following physical therapy would be MRI for injection planning.  We also spent time talking about her right shoulder.  She has evidence of a chronic right shoulder rotator cuff repair.  If she cannot get fully better enough with physical therapy a glenohumeral injection may be helpful.  She will contact my office after physical therapy ends in April and let me know how she feels.  If not better consider MRI cervical spine for potential injection planning.  Additionally we can try a glenohumeral steroid injection.  Total encounter time 30 minutes including face-to-face  time with the patient and, reviewing past medical record, and charting on the date of service.    PDMP not reviewed this encounter. Orders Placed This Encounter  Procedures   DG Shoulder Right    Standing Status:   Future    Number of Occurrences:   1    Standing Expiration Date:   03/12/2024    Order Specific Question:   Reason for Exam (SYMPTOM  OR DIAGNOSIS REQUIRED)    Answer:   right shoulder pain     Order Specific Question:   Preferred imaging location?    Answer:   [facility]   No orders of the defined types were placed in this encounter.    Discussed warning signs or symptoms. Please see discharge instructions. Patient expresses understanding.   The above documentation has been reviewed and is accurate and complete Lynne Leader, M.D.

## 2023-03-14 ENCOUNTER — Ambulatory Visit: Payer: Medicare Other

## 2023-03-14 DIAGNOSIS — G8929 Other chronic pain: Secondary | ICD-10-CM | POA: Diagnosis not present

## 2023-03-14 DIAGNOSIS — M25511 Pain in right shoulder: Secondary | ICD-10-CM | POA: Diagnosis not present

## 2023-03-14 DIAGNOSIS — M6281 Muscle weakness (generalized): Secondary | ICD-10-CM | POA: Diagnosis not present

## 2023-03-14 DIAGNOSIS — M542 Cervicalgia: Secondary | ICD-10-CM | POA: Diagnosis not present

## 2023-03-15 NOTE — Progress Notes (Signed)
Right shoulder x-ray shows some evidence of rotator cuff tendinitis.

## 2023-03-18 ENCOUNTER — Other Ambulatory Visit: Payer: Self-pay | Admitting: Gastroenterology

## 2023-03-19 ENCOUNTER — Ambulatory Visit: Payer: Medicare Other

## 2023-03-19 DIAGNOSIS — M6281 Muscle weakness (generalized): Secondary | ICD-10-CM | POA: Diagnosis not present

## 2023-03-19 DIAGNOSIS — G8929 Other chronic pain: Secondary | ICD-10-CM | POA: Diagnosis not present

## 2023-03-19 DIAGNOSIS — M542 Cervicalgia: Secondary | ICD-10-CM

## 2023-03-19 DIAGNOSIS — M25511 Pain in right shoulder: Secondary | ICD-10-CM | POA: Diagnosis not present

## 2023-03-19 NOTE — Therapy (Signed)
OUTPATIENT PHYSICAL THERAPY TREATMENT NOTE   Patient Name: Wanda Rivera MRN: [id number] DOB:1941/07/07, 82 y.o., female Today's Date: 03/19/2023  PCP: Hoyt Koch, MD  REFERRING PROVIDER: Gregor Hams, MD    END OF SESSION:   PT End of Session - 03/19/23 1042     Visit Number 5    Number of Visits 17    Date for PT Re-Evaluation 04/18/23    Authorization Type Medicare    PT Start Time [id number]    PT Stop Time 1125    PT Time Calculation (min) 40 min    Activity Tolerance Patient tolerated treatment well    Behavior During Therapy WFL for tasks assessed/performed               Past Medical History:  Diagnosis Date   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,hands   Breast cancer ([place])    1982, recurrence in 1985   Cataract    Bil   Glaucoma    Bil   Osteoporosis    Past Surgical History:  Procedure Laterality Date   ABDOMINAL HYSTERECTOMY     BREAST SURGERY     double masectomy for breast cancer   eye surgery Bilateral 06/05/2017   for Glaucoma   Patient Active Problem List   Diagnosis Date Noted   Pain in joint of right shoulder 03/13/2023   Neck swelling 01/01/2023   High vitamin D level 01/01/2023   Chronic neck pain 01/01/2023   Numbness of fingers of both hands 12/29/2021   Right sided abdominal pain 02/23/2020   Dysphagia 08/19/2019   Primary osteoarthritis of right knee 12/26/2018   Cough 12/19/2018   Constipation 12/19/2018   Rotator cuff arthropathy of right shoulder 04/02/2018   GERD (gastroesophageal reflux disease) 12/13/2017   History of breast cancer 01/02/2017   Compression fracture of L3 lumbar vertebra 05/04/2016   Hyperlipidemia 08/16/2015   Left carotid bruit 08/16/2015   Atherosclerotic peripheral vascular disease ([place]) 08/06/2015   Routine general medical examination at a health care facility 12/09/2014   Osteopenia 12/09/2014   Glaucoma 12/09/2014    REFERRING DIAG: M54.2 (ICD-10-CM) - Neck pain M25.511 (ICD-10-CM) - Pain in  joint of right shoulder  THERAPY DIAG:  Cervicalgia  Chronic right shoulder pain  Muscle weakness (generalized)  Rationale for Evaluation and Treatment Rehabilitation  PERTINENT HISTORY: Breast Cancer, Osteoporosis   PRECAUTIONS: None  SUBJECTIVE:                                                                                                                                                                                      SUBJECTIVE STATEMENT:  Pt presents to PT with  reports of decreased neck pain, does continue to have pain in R shoulder. Pt has been compliant with HEP with no adverse effect.    PAIN:  Are you having pain?  Yes: NPRS scale: /10 Worst: 10/10 Pain location: R anterior shoulder Pain description: achy Aggravating factors: reaching overhead Relieving factors: sleeve   Are you having pain?  No: NPRS scale: 0/10 Worst: 4/10 Pain location: neck - left upper trap Pain description: burning Aggravating factors: cleaning,  Relieving factors: icy hot   OBJECTIVE: (objective measures completed at initial evaluation unless otherwise dated)   DIAGNOSTIC FINDINGS:  See imaging   PATIENT SURVEYS:  FOTO: 61% function; 66% predicted   COGNITION: Overall cognitive status: Within functional limits for tasks assessed   SENSATION: WFL   POSTURE: rounded shoulders and forward head   PALPATION: Slight TTP to L upper trap     CERVICAL ROM:    Active ROM A/PROM (deg) eval  Flexion    Extension    Right lateral flexion    Left lateral flexion    Right rotation 60  Left rotation 48   (Blank rows = not tested)   UPPER EXTREMITY ROM:   Active ROM Right eval Left eval  Shoulder flexion 65 WFL  Shoulder extension      Shoulder abduction 55 WFL  Shoulder adduction      Shoulder extension      Shoulder internal rotation L2 T8  Shoulder external rotation      Elbow flexion      Elbow extension      Wrist flexion      Wrist extension      Wrist  ulnar deviation      Wrist radial deviation      Wrist pronation      Wrist supination       (Blank rows = not tested)   UPPER EXTREMITY MMT:   MMT Right eval Left eval  Shoulder flexion 3/5 4+/5  Shoulder extension      Shoulder abduction 3/5 4+/5  Shoulder adduction      Shoulder extension      Shoulder internal rotation 3/5 4+/5  Shoulder external rotation 3/5 4+/5  Middle trapezius      Lower trapezius      Elbow flexion      Elbow extension      Wrist flexion      Wrist extension      Wrist ulnar deviation      Wrist radial deviation      Wrist pronation      Wrist supination      Grip strength       (Blank rows = not tested)   CERVICAL SPECIAL TESTS:  Neck flexor muscle endurance test: 8 seconds   FUNCTIONAL TESTS:  N/A   TREATMENT: [facility] Adult PT Treatment:                                                DATE: 03/19/23 Therapeutic Exercise: UBE level 1 x 4 mins 2'/2' fwd/bwd Supine chin tuck x 10  Seated low row 2x10 20# Standing shoulder ext 2x10 RTB Manual: Skilled palpation of trigger points during TPDN Suboccipital release Positional release BIL upper trap Trigger Point Dry Needling Treatment: Pre-treatment instruction: Patient instructed on dry needling rationale, procedures, and possible side effects including pain during treatment (achy,cramping  feeling), bruising, drop of blood, lightheadedness, nausea, sweating. Patient Consent Given: Yes Education handout provided: No Muscles treated: bilateral upper traps  Needle size and number: .30x77mm x 2 Electrical stimulation performed: No Parameters: N/A Treatment response/outcome: Twitch response elicited and Palpable decrease in muscle tension Post-treatment instructions: Patient instructed to expect possible mild to moderate muscle soreness later today and/or tomorrow. Patient instructed in methods to reduce muscle soreness and to continue prescribed HEP. If patient was dry needled over the lung field,  patient was instructed on signs and symptoms of pneumothorax and, however unlikely, to see immediate medical attention should they occur. Patient was also educated on signs and symptoms of infection and to seek medical attention should they occur. Patient verbalized understanding of these instructions and education.  [place] [facility] Adult PT Treatment:                                                DATE: 03/13/23 Therapeutic Exercise: UBE level 1 x 6 mins 3'/3' fwd/bwd Hor abduction YTB 15x Diagonals YTB Lt only  Rows RTB 2x10 Seated ER YTB 2x10 Upper trap stretch 2x30" BIL Manual: STM BIL upper traps, cervical paraspinals Positional release BIL upper trap  [facility] Adult PT Treatment:                                                DATE: 03/08/23 Therapeutic Exercise: UBE level 1 x 6 mins 3'/3' fwd/bwd Hor abduction YTB 15x Seated ER YTB 15x Scaption on wall with towel 10x(limited by supraspinatus weakness and subsequent substitution pattern) Upper trap stretch 2x30" L Levator scap stretch 2x30" L Manual Therapy: L first rib inferior during L shoulder flexion 10x Manual L scalene stretch 30s x3 STM to L upper trap while discussing TPDN technique   [facility] Adult PT Treatment:                                                DATE: 03/06/23 Therapeutic Exercise: UBE level 1 x 6 mins 3'/3' fwd/bwd Rows RTB 2x10 Shoulder extension YTB 2x10 ER/IR Rt YTB x10 each Standing Rt pball slide flexion/abduction 2x10 each Upper trap stretch 2x30" BIL Levator scap stretch 2x30" Manual: STM BIL upper traps, cervical paraspinals Positional release BIL upper trap   PATIENT EDUCATION:  Education details: eval findings, FOTO, HEP, POC Person educated: Patient Education method: Explanation, Demonstration, and Handouts Education comprehension: verbalized understanding and returned demonstration   HOME EXERCISE PROGRAM: Access Code: [email or web address] URL: [email or web address] Date: 02/21/2023 Prepared by:  Octavio Manns   Exercises - Seated Upper Trapezius Stretch  - 1 x daily - 7 x weekly - 2 reps - 30 sec hold - Standing Shoulder Row with Anchored Resistance  - 1 x daily - 7 x weekly - 3 sets - 10 reps - red band hold - Standing Isometric Shoulder Internal Rotation with Towel Roll at Doorway  - 1 x daily - 7 x weekly - 2 sets - 10 reps - 3-5 sec hold - Standing Isometric Shoulder External Rotation with Doorway and Towel Roll  - 1 x daily - 7 x weekly -  2 sets - 10 reps - 3-5 hold hold - Seated Shoulder Flexion Towel Slide at Table Top  - 1 x daily - 7 x weekly - 2 sets - 10 reps - 5 sec hold   ASSESSMENT:   CLINICAL IMPRESSION:  Pt was able to complete prescribed exercises with no adverse effect. She responded well to TPDN noting decrease in pain post session. She continues to benefit from skilled PT services, will continue per POC as prescribed.     OBJECTIVE IMPAIRMENTS: decreased activity tolerance, decreased mobility, decreased ROM, decreased strength, impaired UE functional use, and pain.    ACTIVITY LIMITATIONS: carrying, lifting, reach over head, and caring for others   PARTICIPATION LIMITATIONS: meal prep, cleaning, laundry, driving, shopping, community activity, and yard work   PERSONAL FACTORS: Time since onset of injury/illness/exacerbation and 1-2 comorbidities: Breast Cancer, Osteoporosis   are also affecting patient's functional outcome.      GOALS: Goals reviewed with patient? No   SHORT TERM GOALS: Target date: 03/14/2023   Pt will be compliant and knowledgeable with initial HEP for improved comfort and carryover Baseline: initial HEP given  Goal status: MET Pt reports adherence 03/14/23   2.  Pt will self report neck and shoulder pain no greater than 6/10 for improved comfort and functional ability Baseline: 10/10 at worst Goal status: MET 03/14/23: Patient reports 3/10 at [place]: Target date: 04/18/2023   Pt will improve FOTO function score to no  less than 66% as proxy for functional improvement Baseline: 61% function Goal status: INITIAL    2.  Pt will self report neck and shoulder pain no greater than 2-3/10 for improved comfort and functional ability Baseline: 10/10 at worst Goal status: INITIAL    3.  Pt will improve R shoulder flex/abd to no less than 120 for improved comfort and functional ability with home ADLs and overhead reaching Baseline: see chart Goal status: INITIAL   4.  Pt will improve left cervical rotation to no less than 60 for improved comfort and function with driving Baseline: 48 Goal status: INITIAL       PLAN:   PT FREQUENCY: 2x/week   PT DURATION: 8 weeks   PLANNED INTERVENTIONS: Therapeutic exercises, Therapeutic activity, Neuromuscular re-education, Balance training, Gait training, Patient/Family education, Self Care, Joint mobilization, Aquatic Therapy, Dry Needling, Electrical stimulation, Cryotherapy, Moist heat, Vasopneumatic device, Manual therapy, and Re-evaluation   PLAN FOR NEXT SESSION: assess HEP response, progress DNF/periscapular/RTC strength, manual for decreasing neck pain   Ward Chatters, PT 03/19/2023, 12:21 PM

## 2023-03-20 NOTE — Therapy (Signed)
OUTPATIENT PHYSICAL THERAPY TREATMENT NOTE   Patient Name: Wanda Rivera MRN: [id number] DOB:November 06, 1941, 82 y.o., female Today's Date: 03/21/2023  PCP: Hoyt Koch, MD  REFERRING PROVIDER: Gregor Hams, MD    END OF SESSION:   PT End of Session - 03/21/23 1341     Visit Number 6    Number of Visits 17    Date for PT Re-Evaluation 04/18/23    Authorization Type Medicare    PT Start Time [id number]    PT Stop Time 1125    PT Time Calculation (min) 40 min    Activity Tolerance Patient tolerated treatment well    Behavior During Therapy WFL for tasks assessed/performed                Past Medical History:  Diagnosis Date   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,hands   Breast cancer ([place])    1982, recurrence in 1985   Cataract    Bil   Glaucoma    Bil   Osteoporosis    Past Surgical History:  Procedure Laterality Date   ABDOMINAL HYSTERECTOMY     BREAST SURGERY     double masectomy for breast cancer   eye surgery Bilateral 06/05/2017   for Glaucoma   Patient Active Problem List   Diagnosis Date Noted   Pain in joint of right shoulder 03/13/2023   Neck swelling 01/01/2023   High vitamin D level 01/01/2023   Chronic neck pain 01/01/2023   Numbness of fingers of both hands 12/29/2021   Right sided abdominal pain 02/23/2020   Dysphagia 08/19/2019   Primary osteoarthritis of right knee 12/26/2018   Cough 12/19/2018   Constipation 12/19/2018   Rotator cuff arthropathy of right shoulder 04/02/2018   GERD (gastroesophageal reflux disease) 12/13/2017   History of breast cancer 01/02/2017   Compression fracture of L3 lumbar vertebra 05/04/2016   Hyperlipidemia 08/16/2015   Left carotid bruit 08/16/2015   Atherosclerotic peripheral vascular disease ([place]) 08/06/2015   Routine general medical examination at a health care facility 12/09/2014   Osteopenia 12/09/2014   Glaucoma 12/09/2014    REFERRING DIAG: M54.2 (ICD-10-CM) - Neck pain M25.511 (ICD-10-CM) - Pain in  joint of right shoulder  THERAPY DIAG:  Cervicalgia  Chronic right shoulder pain  Muscle weakness (generalized)  Rationale for Evaluation and Treatment Rehabilitation  PERTINENT HISTORY: Breast Cancer, Osteoporosis   PRECAUTIONS: None  SUBJECTIVE:                                                                                                                                                                                      SUBJECTIVE STATEMENT:  Pt presents to PT  with reports of decreased pain today, stating she got good relief from TPDN last session. Has been compliant with HEP with no adverse effect.    PAIN:  Are you having pain?  Yes: NPRS scale: 0/10 Worst: 10/10 Pain location: R anterior shoulder Pain description: achy Aggravating factors: reaching overhead Relieving factors: sleeve   Are you having pain?  No: NPRS scale: 0/10 Worst: 4/10 Pain location: neck - left upper trap Pain description: burning Aggravating factors: cleaning,  Relieving factors: icy hot   OBJECTIVE: (objective measures completed at initial evaluation unless otherwise dated)   DIAGNOSTIC FINDINGS:  See imaging   PATIENT SURVEYS:  FOTO: 61% function; 66% predicted   COGNITION: Overall cognitive status: Within functional limits for tasks assessed   SENSATION: WFL   POSTURE: rounded shoulders and forward head   PALPATION: Slight TTP to L upper trap     CERVICAL ROM:    Active ROM A/PROM (deg) eval  Flexion    Extension    Right lateral flexion    Left lateral flexion    Right rotation 60  Left rotation 48   (Blank rows = not tested)   UPPER EXTREMITY ROM:   Active ROM Right eval Left eval  Shoulder flexion 65 WFL  Shoulder extension      Shoulder abduction 55 WFL  Shoulder adduction      Shoulder extension      Shoulder internal rotation L2 T8  Shoulder external rotation      Elbow flexion      Elbow extension      Wrist flexion      Wrist extension       Wrist ulnar deviation      Wrist radial deviation      Wrist pronation      Wrist supination       (Blank rows = not tested)   UPPER EXTREMITY MMT:   MMT Right eval Left eval  Shoulder flexion 3/5 4+/5  Shoulder extension      Shoulder abduction 3/5 4+/5  Shoulder adduction      Shoulder extension      Shoulder internal rotation 3/5 4+/5  Shoulder external rotation 3/5 4+/5  Middle trapezius      Lower trapezius      Elbow flexion      Elbow extension      Wrist flexion      Wrist extension      Wrist ulnar deviation      Wrist radial deviation      Wrist pronation      Wrist supination      Grip strength       (Blank rows = not tested)   CERVICAL SPECIAL TESTS:  Neck flexor muscle endurance test: 8 seconds   FUNCTIONAL TESTS:  N/A   TREATMENT: [facility] Adult PT Treatment:                                                DATE: 03/21/23 Therapeutic Exercise: Supine chin tuck x 10  Seated bilateral ER 2x10 RTB Seated horizontal abd 2x10 RTB Seated row 2x10 RTB Manual Therapy: Skilled palpation of trigger points during TPDN Suboccipital release Positional release BIL upper trap Trigger Point Dry Needling Treatment: Pre-treatment instruction: Patient instructed on dry needling rationale, procedures, and possible side effects including pain during treatment (achy,cramping feeling), bruising,  drop of blood, lightheadedness, nausea, sweating. Patient Consent Given: Yes Education handout provided: No Muscles treated: bilateral upper traps  Needle size and number: .30x82mm x 2 Electrical stimulation performed: No Parameters: N/A Treatment response/outcome: Twitch response elicited and Palpable decrease in muscle tension Post-treatment instructions: Patient instructed to expect possible mild to moderate muscle soreness later today and/or tomorrow. Patient instructed in methods to reduce muscle soreness and to continue prescribed HEP. If patient was dry needled over the lung  field, patient was instructed on signs and symptoms of pneumothorax and, however unlikely, to see immediate medical attention should they occur. Patient was also educated on signs and symptoms of infection and to seek medical attention should they occur. Patient verbalized understanding of these instructions and education.  [facility] Adult PT Treatment:                                                DATE: 03/19/23 Therapeutic Exercise: UBE level 1 x 4 mins 2'/2' fwd/bwd Supine chin tuck x 10  Seated low row 2x10 20# Standing shoulder ext 2x10 RTB Manual: Skilled palpation of trigger points during TPDN Suboccipital release Positional release BIL upper trap Trigger Point Dry Needling Treatment: Pre-treatment instruction: Patient instructed on dry needling rationale, procedures, and possible side effects including pain during treatment (achy,cramping feeling), bruising, drop of blood, lightheadedness, nausea, sweating. Patient Consent Given: Yes Education handout provided: No Muscles treated: bilateral upper traps  Needle size and number: .30x36mm x 2 Electrical stimulation performed: No Parameters: N/A Treatment response/outcome: Twitch response elicited and Palpable decrease in muscle tension Post-treatment instructions: Patient instructed to expect possible mild to moderate muscle soreness later today and/or tomorrow. Patient instructed in methods to reduce muscle soreness and to continue prescribed HEP. If patient was dry needled over the lung field, patient was instructed on signs and symptoms of pneumothorax and, however unlikely, to see immediate medical attention should they occur. Patient was also educated on signs and symptoms of infection and to seek medical attention should they occur. Patient verbalized understanding of these instructions and education.  [facility] Adult PT Treatment:                                                DATE: 03/13/23 Therapeutic Exercise: UBE level 1 x 6 mins 3'/3'  fwd/bwd Hor abduction YTB 15x Diagonals YTB Lt only  Rows RTB 2x10 Seated ER YTB 2x10 Upper trap stretch 2x30" BIL Manual: STM BIL upper traps, cervical paraspinals Positional release BIL upper trap  PATIENT EDUCATION:  Education details: eval findings, FOTO, HEP, POC Person educated: Patient Education method: Explanation, Demonstration, and Handouts Education comprehension: verbalized understanding and returned demonstration   HOME EXERCISE PROGRAM: Access Code: [email or web address] URL: [email or web address] Date: 02/21/2023 Prepared by: Octavio Manns   Exercises - Seated Upper Trapezius Stretch  - 1 x daily - 7 x weekly - 2 reps - 30 sec hold - Standing Shoulder Row with Anchored Resistance  - 1 x daily - 7 x weekly - 3 sets - 10 reps - red band hold - Standing Isometric Shoulder Internal Rotation with Towel Roll at Doorway  - 1 x daily - 7 x weekly - 2 sets - 10 reps - 3-5 sec hold - Standing  Isometric Shoulder External Rotation with Doorway and Towel Roll  - 1 x daily - 7 x weekly - 2 sets - 10 reps - 3-5 hold hold - Seated Shoulder Flexion Towel Slide at Table Top  - 1 x daily - 7 x weekly - 2 sets - 10 reps - 5 sec hold   ASSESSMENT:   CLINICAL IMPRESSION:  Patient was able to complete prescribed exercises with no adverse effect and once again responded well to TPDN and manual therapy interventions. Unfortunately, patient had a fall to floor during session. Patient was prone on mat table during trigger point dry needling (TPDN) and manual therapy. After performance of TPDN, PT explained to patient that she would be moving from prone to supine on the mat table. PT turned around from the table to get a pillow and pillowcase from the cabinet, not realizing that patient was moving from prone to standing. The mat table was raised up during the performance of TPDN and manual, which the patient did not realize, and she slide off the slide of the table and onto her left side and left  hip. She was unharmed and was able to independently stand afterwards and return to sitting on the mat table. After assuring she was okay, session was resumed with resumption of manual therapy then therapeutic exercises. PT walked with patient to front of clinic after session and called three hours later with patient reporting that she was doing fine and feeling well. She continues to benefit from skilled PT services and will continue to be seen and progressed as able per POC.    OBJECTIVE IMPAIRMENTS: decreased activity tolerance, decreased mobility, decreased ROM, decreased strength, impaired UE functional use, and pain.    ACTIVITY LIMITATIONS: carrying, lifting, reach over head, and caring for others   PARTICIPATION LIMITATIONS: meal prep, cleaning, laundry, driving, shopping, community activity, and yard work   PERSONAL FACTORS: Time since onset of injury/illness/exacerbation and 1-2 comorbidities: Breast Cancer, Osteoporosis   are also affecting patient's functional outcome.      GOALS: Goals reviewed with patient? No   SHORT TERM GOALS: Target date: 03/14/2023   Pt will be compliant and knowledgeable with initial HEP for improved comfort and carryover Baseline: initial HEP given  Goal status: MET Pt reports adherence 03/14/23   2.  Pt will self report neck and shoulder pain no greater than 6/10 for improved comfort and functional ability Baseline: 10/10 at worst Goal status: MET 03/14/23: Patient reports 3/10 at [place]: Target date: 04/18/2023   Pt will improve FOTO function score to no less than 66% as proxy for functional improvement Baseline: 61% function Goal status: INITIAL    2.  Pt will self report neck and shoulder pain no greater than 2-3/10 for improved comfort and functional ability Baseline: 10/10 at worst Goal status: INITIAL    3.  Pt will improve R shoulder flex/abd to no less than 120 for improved comfort and functional ability with home ADLs  and overhead reaching Baseline: see chart Goal status: INITIAL   4.  Pt will improve left cervical rotation to no less than 60 for improved comfort and function with driving Baseline: 48 Goal status: INITIAL       PLAN:   PT FREQUENCY: 2x/week   PT DURATION: 8 weeks   PLANNED INTERVENTIONS: Therapeutic exercises, Therapeutic activity, Neuromuscular re-education, Balance training, Gait training, Patient/Family education, Self Care, Joint mobilization, Aquatic Therapy, Dry Needling, Electrical stimulation, Cryotherapy, Moist heat, Vasopneumatic device,  Manual therapy, and Re-evaluation   PLAN FOR NEXT SESSION: assess HEP response, progress DNF/periscapular/RTC strength, manual for decreasing neck pain   Ward Chatters, PT 03/21/2023, 2:19 PM

## 2023-03-21 ENCOUNTER — Ambulatory Visit: Payer: Medicare Other

## 2023-03-21 DIAGNOSIS — M25511 Pain in right shoulder: Secondary | ICD-10-CM | POA: Diagnosis not present

## 2023-03-21 DIAGNOSIS — M6281 Muscle weakness (generalized): Secondary | ICD-10-CM | POA: Diagnosis not present

## 2023-03-21 DIAGNOSIS — M542 Cervicalgia: Secondary | ICD-10-CM

## 2023-03-21 DIAGNOSIS — G8929 Other chronic pain: Secondary | ICD-10-CM

## 2023-03-22 DIAGNOSIS — H401123 Primary open-angle glaucoma, left eye, severe stage: Secondary | ICD-10-CM | POA: Diagnosis not present

## 2023-03-26 ENCOUNTER — Ambulatory Visit: Payer: Medicare Other

## 2023-03-26 DIAGNOSIS — G8929 Other chronic pain: Secondary | ICD-10-CM

## 2023-03-26 DIAGNOSIS — M6281 Muscle weakness (generalized): Secondary | ICD-10-CM | POA: Diagnosis not present

## 2023-03-26 DIAGNOSIS — M542 Cervicalgia: Secondary | ICD-10-CM

## 2023-03-26 DIAGNOSIS — M25511 Pain in right shoulder: Secondary | ICD-10-CM | POA: Diagnosis not present

## 2023-03-26 NOTE — Therapy (Signed)
OUTPATIENT PHYSICAL THERAPY TREATMENT NOTE   Patient Name: Wanda Rivera MRN: [id number] DOB:03-Oct-1941, 82 y.o., female Today's Date: 03/26/2023  PCP: Hoyt Koch, MD  REFERRING PROVIDER: Gregor Hams, MD    END OF SESSION:   PT End of Session - 03/26/23 1046     Visit Number 7    Number of Visits 17    Date for PT Re-Evaluation 04/18/23    Authorization Type Medicare    PT Start Time [id number]    PT Stop Time 1125    PT Time Calculation (min) 40 min    Activity Tolerance Patient tolerated treatment well    Behavior During Therapy WFL for tasks assessed/performed                 Past Medical History:  Diagnosis Date   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,hands   Breast cancer ([place])    1982, recurrence in 1985   Cataract    Bil   Glaucoma    Bil   Osteoporosis    Past Surgical History:  Procedure Laterality Date   ABDOMINAL HYSTERECTOMY     BREAST SURGERY     double masectomy for breast cancer   eye surgery Bilateral 06/05/2017   for Glaucoma   Patient Active Problem List   Diagnosis Date Noted   Pain in joint of right shoulder 03/13/2023   Neck swelling 01/01/2023   High vitamin D level 01/01/2023   Chronic neck pain 01/01/2023   Numbness of fingers of both hands 12/29/2021   Right sided abdominal pain 02/23/2020   Dysphagia 08/19/2019   Primary osteoarthritis of right knee 12/26/2018   Cough 12/19/2018   Constipation 12/19/2018   Rotator cuff arthropathy of right shoulder 04/02/2018   GERD (gastroesophageal reflux disease) 12/13/2017   History of breast cancer 01/02/2017   Compression fracture of L3 lumbar vertebra 05/04/2016   Hyperlipidemia 08/16/2015   Left carotid bruit 08/16/2015   Atherosclerotic peripheral vascular disease ([place]) 08/06/2015   Routine general medical examination at a health care facility 12/09/2014   Osteopenia 12/09/2014   Glaucoma 12/09/2014    REFERRING DIAG: M54.2 (ICD-10-CM) - Neck pain M25.511 (ICD-10-CM) - Pain in  joint of right shoulder  THERAPY DIAG:  Cervicalgia  Chronic right shoulder pain  Muscle weakness (generalized)  Rationale for Evaluation and Treatment Rehabilitation  PERTINENT HISTORY: Breast Cancer, Osteoporosis   PRECAUTIONS: None  SUBJECTIVE:                                                                                                                                                                                      SUBJECTIVE STATEMENT:  Pt presents to  PT with continued reports of improvement in neck and shoulder pain. Has been compliant with HEP with no adverse effect. Notes she had a bruise on her left posterior hip after fall in clinic last visit, PT recommended reaching out to PCP if this remains painful and icing at night.   PAIN:  Are you having pain?  Yes: NPRS scale: 0/10 Worst: 10/10 Pain location: R anterior shoulder Pain description: achy Aggravating factors: reaching overhead Relieving factors: sleeve   Are you having pain?  No: NPRS scale: 0/10 Worst: 4/10 Pain location: neck - left upper trap Pain description: burning Aggravating factors: cleaning,  Relieving factors: icy hot   OBJECTIVE: (objective measures completed at initial evaluation unless otherwise dated)   DIAGNOSTIC FINDINGS:  See imaging   PATIENT SURVEYS:  FOTO: 61% function; 66% predicted   COGNITION: Overall cognitive status: Within functional limits for tasks assessed   SENSATION: WFL   POSTURE: rounded shoulders and forward head   PALPATION: Slight TTP to L upper trap     CERVICAL ROM:    Active ROM A/PROM (deg) eval  Flexion    Extension    Right lateral flexion    Left lateral flexion    Right rotation 60  Left rotation 48   (Blank rows = not tested)   UPPER EXTREMITY ROM:   Active ROM Right eval Left eval  Shoulder flexion 65 WFL  Shoulder extension      Shoulder abduction 55 WFL  Shoulder adduction      Shoulder extension      Shoulder  internal rotation L2 T8  Shoulder external rotation      Elbow flexion      Elbow extension      Wrist flexion      Wrist extension      Wrist ulnar deviation      Wrist radial deviation      Wrist pronation      Wrist supination       (Blank rows = not tested)   UPPER EXTREMITY MMT:   MMT Right eval Left eval  Shoulder flexion 3/5 4+/5  Shoulder extension      Shoulder abduction 3/5 4+/5  Shoulder adduction      Shoulder extension      Shoulder internal rotation 3/5 4+/5  Shoulder external rotation 3/5 4+/5  Middle trapezius      Lower trapezius      Elbow flexion      Elbow extension      Wrist flexion      Wrist extension      Wrist ulnar deviation      Wrist radial deviation      Wrist pronation      Wrist supination      Grip strength       (Blank rows = not tested)   CERVICAL SPECIAL TESTS:  Neck flexor muscle endurance test: 8 seconds   FUNCTIONAL TESTS:  N/A   TREATMENT: [facility] Adult PT Treatment:                                                DATE: 03/26/23 Therapeutic Exercise: UBE lvl 1.0 x 4 min while taking subjective Supine chin tuck x 10  Standing row 2x10 10# Seated bilateral ER x 10 RTB Manual Therapy: Skilled palpation of trigger points during TPDN Suboccipital release  Positional release BIL upper trap Trigger Point Dry Needling Treatment: Pre-treatment instruction: Patient instructed on dry needling rationale, procedures, and possible side effects including pain during treatment (achy,cramping feeling), bruising, drop of blood, lightheadedness, nausea, sweating. Patient Consent Given: Yes Education handout provided: No Muscles treated: bilateral upper traps  Needle size and number: .30x27mm x 2 Electrical stimulation performed: No Parameters: N/A Treatment response/outcome: Twitch response elicited and Palpable decrease in muscle tension Post-treatment instructions: Patient instructed to expect possible mild to moderate muscle soreness  later today and/or tomorrow. Patient instructed in methods to reduce muscle soreness and to continue prescribed HEP. If patient was dry needled over the lung field, patient was instructed on signs and symptoms of pneumothorax and, however unlikely, to see immediate medical attention should they occur. Patient was also educated on signs and symptoms of infection and to seek medical attention should they occur. Patient verbalized understanding of these instructions and education.  [facility] Adult PT Treatment:                                                DATE: 03/21/23 Therapeutic Exercise: Supine chin tuck x 10  Seated bilateral ER 2x10 RTB Seated horizontal abd 2x10 RTB Seated row 2x10 RTB Manual Therapy: Skilled palpation of trigger points during TPDN Suboccipital release Positional release BIL upper trap Trigger Point Dry Needling Treatment: Pre-treatment instruction: Patient instructed on dry needling rationale, procedures, and possible side effects including pain during treatment (achy,cramping feeling), bruising, drop of blood, lightheadedness, nausea, sweating. Patient Consent Given: Yes Education handout provided: No Muscles treated: bilateral upper traps  Needle size and number: .30x57mm x 2 Electrical stimulation performed: No Parameters: N/A Treatment response/outcome: Twitch response elicited and Palpable decrease in muscle tension Post-treatment instructions: Patient instructed to expect possible mild to moderate muscle soreness later today and/or tomorrow. Patient instructed in methods to reduce muscle soreness and to continue prescribed HEP. If patient was dry needled over the lung field, patient was instructed on signs and symptoms of pneumothorax and, however unlikely, to see immediate medical attention should they occur. Patient was also educated on signs and symptoms of infection and to seek medical attention should they occur. Patient verbalized understanding of these instructions  and education.  [facility] Adult PT Treatment:                                                DATE: 03/19/23 Therapeutic Exercise: UBE level 1 x 4 mins 2'/2' fwd/bwd Supine chin tuck x 10  Seated low row 2x10 20# Standing shoulder ext 2x10 RTB Manual: Skilled palpation of trigger points during TPDN Suboccipital release Positional release BIL upper trap Trigger Point Dry Needling Treatment: Pre-treatment instruction: Patient instructed on dry needling rationale, procedures, and possible side effects including pain during treatment (achy,cramping feeling), bruising, drop of blood, lightheadedness, nausea, sweating. Patient Consent Given: Yes Education handout provided: No Muscles treated: bilateral upper traps  Needle size and number: .30x43mm x 2 Electrical stimulation performed: No Parameters: N/A Treatment response/outcome: Twitch response elicited and Palpable decrease in muscle tension Post-treatment instructions: Patient instructed to expect possible mild to moderate muscle soreness later today and/or tomorrow. Patient instructed in methods to reduce muscle soreness and to continue prescribed HEP. If patient  was dry needled over the lung field, patient was instructed on signs and symptoms of pneumothorax and, however unlikely, to see immediate medical attention should they occur. Patient was also educated on signs and symptoms of infection and to seek medical attention should they occur. Patient verbalized understanding of these instructions and education.  [facility] Adult PT Treatment:                                                DATE: 03/13/23 Therapeutic Exercise: UBE level 1 x 6 mins 3'/3' fwd/bwd Hor abduction YTB 15x Diagonals YTB Lt only  Rows RTB 2x10 Seated ER YTB 2x10 Upper trap stretch 2x30" BIL Manual: STM BIL upper traps, cervical paraspinals Positional release BIL upper trap  PATIENT EDUCATION:  Education details: eval findings, FOTO, HEP, POC Person educated:  Patient Education method: Explanation, Demonstration, and Handouts Education comprehension: verbalized understanding and returned demonstration   HOME EXERCISE PROGRAM: Access Code: [email or web address] URL: [email or web address] Date: 02/21/2023 Prepared by: Octavio Manns   Exercises - Seated Upper Trapezius Stretch  - 1 x daily - 7 x weekly - 2 reps - 30 sec hold - Standing Shoulder Row with Anchored Resistance  - 1 x daily - 7 x weekly - 3 sets - 10 reps - red band hold - Standing Isometric Shoulder Internal Rotation with Towel Roll at Doorway  - 1 x daily - 7 x weekly - 2 sets - 10 reps - 3-5 sec hold - Standing Isometric Shoulder External Rotation with Doorway and Towel Roll  - 1 x daily - 7 x weekly - 2 sets - 10 reps - 3-5 hold hold - Seated Shoulder Flexion Towel Slide at Table Top  - 1 x daily - 7 x weekly - 2 sets - 10 reps - 5 sec hold   ASSESSMENT:   CLINICAL IMPRESSION:  Pt was able to complete prescribed exercises with no adverse effect. She responded well once again to TPDN and manual, noting decrease in pain post session. She continues to benefit from skilled PT services, will continue per POC as prescribed.    OBJECTIVE IMPAIRMENTS: decreased activity tolerance, decreased mobility, decreased ROM, decreased strength, impaired UE functional use, and pain.    ACTIVITY LIMITATIONS: carrying, lifting, reach over head, and caring for others   PARTICIPATION LIMITATIONS: meal prep, cleaning, laundry, driving, shopping, community activity, and yard work   PERSONAL FACTORS: Time since onset of injury/illness/exacerbation and 1-2 comorbidities: Breast Cancer, Osteoporosis   are also affecting patient's functional outcome.      GOALS: Goals reviewed with patient? No   SHORT TERM GOALS: Target date: 03/14/2023   Pt will be compliant and knowledgeable with initial HEP for improved comfort and carryover Baseline: initial HEP given  Goal status: MET Pt reports adherence  03/14/23   2.  Pt will self report neck and shoulder pain no greater than 6/10 for improved comfort and functional ability Baseline: 10/10 at worst Goal status: MET 03/14/23: Patient reports 3/10 at [place]: Target date: 04/18/2023   Pt will improve FOTO function score to no less than 66% as proxy for functional improvement Baseline: 61% function Goal status: INITIAL    2.  Pt will self report neck and shoulder pain no greater than 2-3/10 for improved comfort and functional ability Baseline: 10/10 at worst Goal status: INITIAL  3.  Pt will improve R shoulder flex/abd to no less than 120 for improved comfort and functional ability with home ADLs and overhead reaching Baseline: see chart Goal status: INITIAL   4.  Pt will improve left cervical rotation to no less than 60 for improved comfort and function with driving Baseline: 48 Goal status: INITIAL       PLAN:   PT FREQUENCY: 2x/week   PT DURATION: 8 weeks   PLANNED INTERVENTIONS: Therapeutic exercises, Therapeutic activity, Neuromuscular re-education, Balance training, Gait training, Patient/Family education, Self Care, Joint mobilization, Aquatic Therapy, Dry Needling, Electrical stimulation, Cryotherapy, Moist heat, Vasopneumatic device, Manual therapy, and Re-evaluation   PLAN FOR NEXT SESSION: assess HEP response, progress DNF/periscapular/RTC strength, manual for decreasing neck pain   Ward Chatters, PT 03/26/2023, 11:45 AM

## 2023-03-28 ENCOUNTER — Ambulatory Visit: Payer: Medicare Other

## 2023-03-28 DIAGNOSIS — M542 Cervicalgia: Secondary | ICD-10-CM

## 2023-03-28 DIAGNOSIS — M25511 Pain in right shoulder: Secondary | ICD-10-CM | POA: Diagnosis not present

## 2023-03-28 DIAGNOSIS — G8929 Other chronic pain: Secondary | ICD-10-CM

## 2023-03-28 DIAGNOSIS — M6281 Muscle weakness (generalized): Secondary | ICD-10-CM | POA: Diagnosis not present

## 2023-03-28 NOTE — Therapy (Signed)
OUTPATIENT PHYSICAL THERAPY TREATMENT NOTE   Patient Name: Wanda Rivera MRN: [id number] DOB:01-15-1941, 81 y.o., female Today's Date: 03/28/2023  PCP: Hoyt Koch, MD  REFERRING PROVIDER: Gregor Hams, MD    END OF SESSION:   PT End of Session - 03/28/23 1035     Visit Number 8    Number of Visits 17    Date for PT Re-Evaluation 04/18/23    Authorization Type Medicare    PT Start Time [id number]    PT Stop Time 1125    PT Time Calculation (min) 40 min    Activity Tolerance Patient tolerated treatment well    Behavior During Therapy WFL for tasks assessed/performed                 Past Medical History:  Diagnosis Date   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,hands   Breast cancer ([place])    1982, recurrence in 1985   Cataract    Bil   Glaucoma    Bil   Osteoporosis    Past Surgical History:  Procedure Laterality Date   ABDOMINAL HYSTERECTOMY     BREAST SURGERY     double masectomy for breast cancer   eye surgery Bilateral 06/05/2017   for Glaucoma   Patient Active Problem List   Diagnosis Date Noted   Pain in joint of right shoulder 03/13/2023   Neck swelling 01/01/2023   High vitamin D level 01/01/2023   Chronic neck pain 01/01/2023   Numbness of fingers of both hands 12/29/2021   Right sided abdominal pain 02/23/2020   Dysphagia 08/19/2019   Primary osteoarthritis of right knee 12/26/2018   Cough 12/19/2018   Constipation 12/19/2018   Rotator cuff arthropathy of right shoulder 04/02/2018   GERD (gastroesophageal reflux disease) 12/13/2017   History of breast cancer 01/02/2017   Compression fracture of L3 lumbar vertebra 05/04/2016   Hyperlipidemia 08/16/2015   Left carotid bruit 08/16/2015   Atherosclerotic peripheral vascular disease ([place]) 08/06/2015   Routine general medical examination at a health care facility 12/09/2014   Osteopenia 12/09/2014   Glaucoma 12/09/2014    REFERRING DIAG: M54.2 (ICD-10-CM) - Neck pain M25.511 (ICD-10-CM) - Pain in  joint of right shoulder  THERAPY DIAG:  Cervicalgia  Chronic right shoulder pain  Muscle weakness (generalized)  Rationale for Evaluation and Treatment Rehabilitation  PERTINENT HISTORY: Breast Cancer, Osteoporosis   PRECAUTIONS: None  SUBJECTIVE:                                                                                                                                                                                      SUBJECTIVE STATEMENT:   Pt presents  to PT with continued reports of improvement in neck and shoulder pain. Has been compliant with HEP with no adverse effect.    PAIN:  Are you having pain?  Yes: NPRS scale: 0/10 Worst: 10/10 Pain location: R anterior shoulder Pain description: achy Aggravating factors: reaching overhead Relieving factors: sleeve   Are you having pain?  No: NPRS scale: 0/10 Worst: 4/10 Pain location: neck - left upper trap Pain description: burning Aggravating factors: cleaning,  Relieving factors: icy hot   OBJECTIVE: (objective measures completed at initial evaluation unless otherwise dated)   DIAGNOSTIC FINDINGS:  See imaging   PATIENT SURVEYS:  FOTO: 61% function; 66% predicted   COGNITION: Overall cognitive status: Within functional limits for tasks assessed   SENSATION: WFL   POSTURE: rounded shoulders and forward head   PALPATION: Slight TTP to L upper trap     CERVICAL ROM:    Active ROM A/PROM (deg) eval  Flexion    Extension    Right lateral flexion    Left lateral flexion    Right rotation 60  Left rotation 48   (Blank rows = not tested)   UPPER EXTREMITY ROM:   Active ROM Right eval Left eval  Shoulder flexion 65 WFL  Shoulder extension      Shoulder abduction 55 WFL  Shoulder adduction      Shoulder extension      Shoulder internal rotation L2 T8  Shoulder external rotation      Elbow flexion      Elbow extension      Wrist flexion      Wrist extension      Wrist ulnar  deviation      Wrist radial deviation      Wrist pronation      Wrist supination       (Blank rows = not tested)   UPPER EXTREMITY MMT:   MMT Right eval Left eval  Shoulder flexion 3/5 4+/5  Shoulder extension      Shoulder abduction 3/5 4+/5  Shoulder adduction      Shoulder extension      Shoulder internal rotation 3/5 4+/5  Shoulder external rotation 3/5 4+/5  Middle trapezius      Lower trapezius      Elbow flexion      Elbow extension      Wrist flexion      Wrist extension      Wrist ulnar deviation      Wrist radial deviation      Wrist pronation      Wrist supination      Grip strength       (Blank rows = not tested)   CERVICAL SPECIAL TESTS:  Neck flexor muscle endurance test: 8 seconds   FUNCTIONAL TESTS:  N/A   TREATMENT: [facility] Adult PT Treatment:                                                DATE: 03/28/23 Therapeutic Exercise: UBE lvl 1.0 x 4 min while taking subjective Supine chin tuck x 10  Seated bilateral ER x 15 RTB Seated horizontal abd 2x10 RTB Standing row 2x10 GTB Standing ext 2x10 GTB Manual Therapy: Skilled palpation of trigger points during TPDN Suboccipital release Positional release BIL upper trap Trigger Point Dry Needling Treatment: Pre-treatment instruction: Patient instructed on dry needling rationale, procedures,  and possible side effects including pain during treatment (achy,cramping feeling), bruising, drop of blood, lightheadedness, nausea, sweating. Patient Consent Given: Yes Education handout provided: No Muscles treated: bilateral upper traps  Needle size and number: .30x32mm x 2 Electrical stimulation performed: No Parameters: N/A Treatment response/outcome: Twitch response elicited and Palpable decrease in muscle tension Post-treatment instructions: Patient instructed to expect possible mild to moderate muscle soreness later today and/or tomorrow. Patient instructed in methods to reduce muscle soreness and to continue  prescribed HEP. If patient was dry needled over the lung field, patient was instructed on signs and symptoms of pneumothorax and, however unlikely, to see immediate medical attention should they occur. Patient was also educated on signs and symptoms of infection and to seek medical attention should they occur. Patient verbalized understanding of these instructions and education.  [facility] Adult PT Treatment:                                                DATE: 03/26/23 Therapeutic Exercise: UBE lvl 1.0 x 4 min while taking subjective Supine chin tuck x 10  Standing row 2x10 10# Seated bilateral ER x 10 RTB Manual Therapy: Skilled palpation of trigger points during TPDN Suboccipital release Positional release BIL upper trap Trigger Point Dry Needling Treatment: Pre-treatment instruction: Patient instructed on dry needling rationale, procedures, and possible side effects including pain during treatment (achy,cramping feeling), bruising, drop of blood, lightheadedness, nausea, sweating. Patient Consent Given: Yes Education handout provided: No Muscles treated: bilateral upper traps  Needle size and number: .30x62mm x 2 Electrical stimulation performed: No Parameters: N/A Treatment response/outcome: Twitch response elicited and Palpable decrease in muscle tension Post-treatment instructions: Patient instructed to expect possible mild to moderate muscle soreness later today and/or tomorrow. Patient instructed in methods to reduce muscle soreness and to continue prescribed HEP. If patient was dry needled over the lung field, patient was instructed on signs and symptoms of pneumothorax and, however unlikely, to see immediate medical attention should they occur. Patient was also educated on signs and symptoms of infection and to seek medical attention should they occur. Patient verbalized understanding of these instructions and education.  [facility] Adult PT Treatment:                                                 DATE: 03/21/23 Therapeutic Exercise: Supine chin tuck x 10  Seated bilateral ER 2x10 RTB Seated horizontal abd 2x10 RTB Seated row 2x10 RTB Manual Therapy: Skilled palpation of trigger points during TPDN Suboccipital release Positional release BIL upper trap Trigger Point Dry Needling Treatment: Pre-treatment instruction: Patient instructed on dry needling rationale, procedures, and possible side effects including pain during treatment (achy,cramping feeling), bruising, drop of blood, lightheadedness, nausea, sweating. Patient Consent Given: Yes Education handout provided: No Muscles treated: bilateral upper traps  Needle size and number: .30x59mm x 2 Electrical stimulation performed: No Parameters: N/A Treatment response/outcome: Twitch response elicited and Palpable decrease in muscle tension Post-treatment instructions: Patient instructed to expect possible mild to moderate muscle soreness later today and/or tomorrow. Patient instructed in methods to reduce muscle soreness and to continue prescribed HEP. If patient was dry needled over the lung field, patient was instructed on signs and symptoms of pneumothorax and,  however unlikely, to see immediate medical attention should they occur. Patient was also educated on signs and symptoms of infection and to seek medical attention should they occur. Patient verbalized understanding of these instructions and education.  PATIENT EDUCATION:  Education details: eval findings, FOTO, HEP, POC Person educated: Patient Education method: Explanation, Demonstration, and Handouts Education comprehension: verbalized understanding and returned demonstration   HOME EXERCISE PROGRAM: Access Code: [email or web address] URL: [email or web address] Date: 02/21/2023 Prepared by: Octavio Manns   Exercises - Seated Upper Trapezius Stretch  - 1 x daily - 7 x weekly - 2 reps - 30 sec hold - Standing Shoulder Row with Anchored Resistance  - 1 x daily - 7  x weekly - 3 sets - 10 reps - red band hold - Standing Isometric Shoulder Internal Rotation with Towel Roll at Doorway  - 1 x daily - 7 x weekly - 2 sets - 10 reps - 3-5 sec hold - Standing Isometric Shoulder External Rotation with Doorway and Towel Roll  - 1 x daily - 7 x weekly - 2 sets - 10 reps - 3-5 hold hold - Seated Shoulder Flexion Towel Slide at Table Top  - 1 x daily - 7 x weekly - 2 sets - 10 reps - 5 sec hold   ASSESSMENT:   CLINICAL IMPRESSION:  Pt was able to complete prescribed exercises with no adverse effect. She responded well once again to TPDN and manual, noting decrease in pain post session. She continues to benefit from skilled PT services, will continue per POC as prescribed.    OBJECTIVE IMPAIRMENTS: decreased activity tolerance, decreased mobility, decreased ROM, decreased strength, impaired UE functional use, and pain.    ACTIVITY LIMITATIONS: carrying, lifting, reach over head, and caring for others   PARTICIPATION LIMITATIONS: meal prep, cleaning, laundry, driving, shopping, community activity, and yard work   PERSONAL FACTORS: Time since onset of injury/illness/exacerbation and 1-2 comorbidities: Breast Cancer, Osteoporosis   are also affecting patient's functional outcome.      GOALS: Goals reviewed with patient? No   SHORT TERM GOALS: Target date: 03/14/2023   Pt will be compliant and knowledgeable with initial HEP for improved comfort and carryover Baseline: initial HEP given  Goal status: MET Pt reports adherence 03/14/23   2.  Pt will self report neck and shoulder pain no greater than 6/10 for improved comfort and functional ability Baseline: 10/10 at worst Goal status: MET 03/14/23: Patient reports 3/10 at [place]: Target date: 04/18/2023   Pt will improve FOTO function score to no less than 66% as proxy for functional improvement Baseline: 61% function Goal status: INITIAL    2.  Pt will self report neck and shoulder pain no  greater than 2-3/10 for improved comfort and functional ability Baseline: 10/10 at worst Goal status: INITIAL    3.  Pt will improve R shoulder flex/abd to no less than 120 for improved comfort and functional ability with home ADLs and overhead reaching Baseline: see chart Goal status: INITIAL   4.  Pt will improve left cervical rotation to no less than 60 for improved comfort and function with driving Baseline: 48 Goal status: INITIAL       PLAN:   PT FREQUENCY: 2x/week   PT DURATION: 8 weeks   PLANNED INTERVENTIONS: Therapeutic exercises, Therapeutic activity, Neuromuscular re-education, Balance training, Gait training, Patient/Family education, Self Care, Joint mobilization, Aquatic Therapy, Dry Needling, Electrical stimulation, Cryotherapy, Moist heat, Vasopneumatic device, Manual therapy, and Re-evaluation   PLAN  FOR NEXT SESSION: assess HEP response, progress DNF/periscapular/RTC strength, manual for decreasing neck pain   Ward Chatters, PT 03/28/2023, 11:32 AM

## 2023-04-02 ENCOUNTER — Ambulatory Visit: Payer: Medicare Other | Attending: Internal Medicine

## 2023-04-02 DIAGNOSIS — M6281 Muscle weakness (generalized): Secondary | ICD-10-CM | POA: Diagnosis not present

## 2023-04-02 DIAGNOSIS — G8929 Other chronic pain: Secondary | ICD-10-CM | POA: Diagnosis not present

## 2023-04-02 DIAGNOSIS — M542 Cervicalgia: Secondary | ICD-10-CM | POA: Diagnosis not present

## 2023-04-02 DIAGNOSIS — M25511 Pain in right shoulder: Secondary | ICD-10-CM | POA: Insufficient documentation

## 2023-04-02 NOTE — Therapy (Signed)
OUTPATIENT PHYSICAL THERAPY TREATMENT NOTE/DISCHARGE  PHYSICAL THERAPY DISCHARGE SUMMARY  Visits from Start of Care: 9  Current functional level related to goals / functional outcomes: See goals and objective   Remaining deficits: None   Education / Equipment: HEP   Patient agrees to discharge. Patient goals were met. Patient is being discharged due to meeting the stated rehab goals.   Patient Name: Wanda Rivera MRN: [id number] DOB:1941-11-27, 82 y.o., female Today's Date: 04/02/2023  PCP: Hoyt Koch, MD  REFERRING PROVIDER: Gregor Hams, MD    END OF SESSION:   PT End of Session - 04/02/23 1036     Visit Number 9    Number of Visits 17    Date for PT Re-Evaluation 04/18/23    Authorization Type Medicare    PT Start Time [id number]    PT Stop Time 1125    PT Time Calculation (min) 40 min    Activity Tolerance Patient tolerated treatment well    Behavior During Therapy WFL for tasks assessed/performed                  Past Medical History:  Diagnosis Date   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,hands   Breast cancer    1982, recurrence in 1985   Cataract    Bil   Glaucoma    Bil   Osteoporosis    Past Surgical History:  Procedure Laterality Date   ABDOMINAL HYSTERECTOMY     BREAST SURGERY     double masectomy for breast cancer   eye surgery Bilateral 06/05/2017   for Glaucoma   Patient Active Problem List   Diagnosis Date Noted   Pain in joint of right shoulder 03/13/2023   Neck swelling 01/01/2023   High vitamin D level 01/01/2023   Chronic neck pain 01/01/2023   Numbness of fingers of both hands 12/29/2021   Right sided abdominal pain 02/23/2020   Dysphagia 08/19/2019   Primary osteoarthritis of right knee 12/26/2018   Cough 12/19/2018   Constipation 12/19/2018   Rotator cuff arthropathy of right shoulder 04/02/2018   GERD (gastroesophageal reflux disease) 12/13/2017   History of breast cancer 01/02/2017   Compression fracture of L3 lumbar  vertebra 05/04/2016   Hyperlipidemia 08/16/2015   Left carotid bruit 08/16/2015   Atherosclerotic peripheral vascular disease 08/06/2015   Routine general medical examination at a health care facility 12/09/2014   Osteopenia 12/09/2014   Glaucoma 12/09/2014    REFERRING DIAG: M54.2 (ICD-10-CM) - Neck pain M25.511 (ICD-10-CM) - Pain in joint of right shoulder  THERAPY DIAG:  Cervicalgia  Chronic right shoulder pain  Muscle weakness (generalized)  Rationale for Evaluation and Treatment Rehabilitation  PERTINENT HISTORY: Breast Cancer, Osteoporosis   PRECAUTIONS: None  SUBJECTIVE:  SUBJECTIVE STATEMENT:   Pt presents to PT with continued no neck pain. Has been compliant with HEP with no adverse effect.    PAIN:  Are you having pain?  Yes: NPRS scale: 0/10 Worst: 10/10 Pain location: R anterior shoulder Pain description: achy Aggravating factors: reaching overhead Relieving factors: sleeve   Are you having pain?  No: NPRS scale: 0/10 Worst: 4/10 Pain location: neck - left upper trap Pain description: burning Aggravating factors: cleaning,  Relieving factors: icy hot   OBJECTIVE: (objective measures completed at initial evaluation unless otherwise dated)   DIAGNOSTIC FINDINGS:  See imaging   PATIENT SURVEYS:  FOTO: 61% function; 66% predicted  70% function - 04/02/2023   COGNITION: Overall cognitive status: Within functional limits for tasks assessed   SENSATION: WFL   POSTURE: rounded shoulders and forward head   PALPATION: Slight TTP to L upper trap     CERVICAL ROM:    Active ROM A/PROM (deg) eval AROM (deg) 04/02/23  Flexion     Extension     Right lateral flexion     Left lateral flexion     Right rotation 60   Left rotation 48 70   (Blank rows = not tested)    UPPER EXTREMITY ROM:   Active ROM Right eval Right 04/02/23  Shoulder flexion 65 180  Shoulder extension      Shoulder abduction 55 180  Shoulder adduction      Shoulder extension      Shoulder internal rotation L2 T8  Shoulder external rotation      Elbow flexion      Elbow extension      Wrist flexion      Wrist extension      Wrist ulnar deviation      Wrist radial deviation      Wrist pronation      Wrist supination       (Blank rows = not tested)   UPPER EXTREMITY MMT:   MMT Right eval Left eval  Shoulder flexion 3/5 4+/5  Shoulder extension      Shoulder abduction 3/5 4+/5  Shoulder adduction      Shoulder extension      Shoulder internal rotation 3/5 4+/5  Shoulder external rotation 3/5 4+/5  Middle trapezius      Lower trapezius      Elbow flexion      Elbow extension      Wrist flexion      Wrist extension      Wrist ulnar deviation      Wrist radial deviation      Wrist pronation      Wrist supination      Grip strength       (Blank rows = not tested)   CERVICAL SPECIAL TESTS:  Neck flexor muscle endurance test: 8 seconds   FUNCTIONAL TESTS:  N/A   TREATMENT: [facility] Adult PT Treatment:                                                DATE: 04/02/23 Therapeutic Activity: Review of HEP as well as assessment of tests/measures, goals, and outcomes Manual Therapy: Skilled palpation of trigger points during TPDN Suboccipital release Positional release BIL upper trap Trigger Point Dry Needling Treatment: Pre-treatment instruction: Patient instructed on dry needling rationale, procedures, and possible side effects including pain during  treatment (achy,cramping feeling), bruising, drop of blood, lightheadedness, nausea, sweating. Patient Consent Given: Yes Education handout provided: No Muscles treated: bilateral upper traps  Needle size and number: .30x72mm x 2 Electrical stimulation performed: No Parameters: N/A Treatment response/outcome: Twitch  response elicited and Palpable decrease in muscle tension Post-treatment instructions: Patient instructed to expect possible mild to moderate muscle soreness later today and/or tomorrow. Patient instructed in methods to reduce muscle soreness and to continue prescribed HEP. If patient was dry needled over the lung field, patient was instructed on signs and symptoms of pneumothorax and, however unlikely, to see immediate medical attention should they occur. Patient was also educated on signs and symptoms of infection and to seek medical attention should they occur. Patient verbalized understanding of these instructions and education.  [facility] Adult PT Treatment:                                                DATE: 03/28/23 Therapeutic Exercise: UBE lvl 1.0 x 4 min while taking subjective Supine chin tuck x 10  Seated bilateral ER x 15 RTB Seated horizontal abd 2x10 RTB Standing row 2x10 GTB Standing ext 2x10 GTB Manual Therapy: Skilled palpation of trigger points during TPDN Suboccipital release Positional release BIL upper trap Trigger Point Dry Needling Treatment: Pre-treatment instruction: Patient instructed on dry needling rationale, procedures, and possible side effects including pain during treatment (achy,cramping feeling), bruising, drop of blood, lightheadedness, nausea, sweating. Patient Consent Given: Yes Education handout provided: No Muscles treated: bilateral upper traps  Needle size and number: .30x2mm x 2 Electrical stimulation performed: No Parameters: N/A Treatment response/outcome: Twitch response elicited and Palpable decrease in muscle tension Post-treatment instructions: Patient instructed to expect possible mild to moderate muscle soreness later today and/or tomorrow. Patient instructed in methods to reduce muscle soreness and to continue prescribed HEP. If patient was dry needled over the lung field, patient was instructed on signs and symptoms of pneumothorax and, however  unlikely, to see immediate medical attention should they occur. Patient was also educated on signs and symptoms of infection and to seek medical attention should they occur. Patient verbalized understanding of these instructions and education.  [facility] Adult PT Treatment:                                                DATE: 03/26/23 Therapeutic Exercise: UBE lvl 1.0 x 4 min while taking subjective Supine chin tuck x 10  Standing row 2x10 10# Seated bilateral ER x 10 RTB Manual Therapy: Skilled palpation of trigger points during TPDN Suboccipital release Positional release BIL upper trap Trigger Point Dry Needling Treatment: Pre-treatment instruction: Patient instructed on dry needling rationale, procedures, and possible side effects including pain during treatment (achy,cramping feeling), bruising, drop of blood, lightheadedness, nausea, sweating. Patient Consent Given: Yes Education handout provided: No Muscles treated: bilateral upper traps  Needle size and number: .30x32mm x 2 Electrical stimulation performed: No Parameters: N/A Treatment response/outcome: Twitch response elicited and Palpable decrease in muscle tension Post-treatment instructions: Patient instructed to expect possible mild to moderate muscle soreness later today and/or tomorrow. Patient instructed in methods to reduce muscle soreness and to continue prescribed HEP. If patient was dry needled over the lung field, patient was instructed  on signs and symptoms of pneumothorax and, however unlikely, to see immediate medical attention should they occur. Patient was also educated on signs and symptoms of infection and to seek medical attention should they occur. Patient verbalized understanding of these instructions and education.  PATIENT EDUCATION:  Education details: eval findings, FOTO, HEP, POC Person educated: Patient Education method: Explanation, Demonstration, and Handouts Education comprehension: verbalized  understanding and returned demonstration   HOME EXERCISE PROGRAM: Access Code: [email or web address] URL: [email or web address] Date: 04/02/2023 Prepared by: Octavio Manns  Exercises - Supine Chin Tuck  - 3-4 x weekly - 2 sets - 10 reps - 3 sec hold - Seated Upper Trapezius Stretch  - 3-4 x weekly - 2 reps - 30 sec hold - Standing Shoulder Row with Anchored Resistance  - 3-4 x weekly - 3 sets - 10 reps - green band hold - Shoulder External Rotation and Scapular Retraction with Resistance  - 3-4 x weekly - 3 sets - 10 reps - red band hold   ASSESSMENT:   CLINICAL IMPRESSION:  Pt once again responded well to manual therapy interventions, noting improved comfort post session. Over the course of PT treatment pt has progressed very well, meeting all LTGs by showing improved cervical and shoulder ROM as well as decrease in pain and met FOTO score. She should continue to improve with HEP compliance and no longer requires skilled PT services at this time.    OBJECTIVE IMPAIRMENTS: decreased activity tolerance, decreased mobility, decreased ROM, decreased strength, impaired UE functional use, and pain.    ACTIVITY LIMITATIONS: carrying, lifting, reach over head, and caring for others   PARTICIPATION LIMITATIONS: meal prep, cleaning, laundry, driving, shopping, community activity, and yard work   PERSONAL FACTORS: Time since onset of injury/illness/exacerbation and 1-2 comorbidities: Breast Cancer, Osteoporosis   are also affecting patient's functional outcome.      GOALS: Goals reviewed with patient? No   SHORT TERM GOALS: Target date: 03/14/2023   Pt will be compliant and knowledgeable with initial HEP for improved comfort and carryover Baseline: initial HEP given  Goal status: MET Pt reports adherence 03/14/23   2.  Pt will self report neck and shoulder pain no greater than 6/10 for improved comfort and functional ability Baseline: 10/10 at worst Goal status: MET 03/14/23: Patient  reports 3/10 at [place]: Target date: 04/18/2023   Pt will improve FOTO function score to no less than 66% as proxy for functional improvement Baseline: 61% function 04/02/2023: 70% function Goal status: MET   2.  Pt will self report neck and shoulder pain no greater than 2-3/10 for improved comfort and functional ability Baseline: 10/10 at worst Goal status: MET   3.  Pt will improve R shoulder flex/abd to no less than 120 for improved comfort and functional ability with home ADLs and overhead reaching Baseline: see chart 04/02/2023: see chart Goal status: MET   4.  Pt will improve left cervical rotation to no less than 60 for improved comfort and function with driving Baseline: 48 04/02/2023: 70 Goal status: MET       PLAN:   PT FREQUENCY: 2x/week   PT DURATION: 8 weeks   PLANNED INTERVENTIONS: Therapeutic exercises, Therapeutic activity, Neuromuscular re-education, Balance training, Gait training, Patient/Family education, Self Care, Joint mobilization, Aquatic Therapy, Dry Needling, Electrical stimulation, Cryotherapy, Moist heat, Vasopneumatic device, Manual therapy, and Re-evaluation   PLAN FOR NEXT SESSION: assess HEP response, progress DNF/periscapular/RTC strength, manual for decreasing neck pain   Monia Sabal  Jesselyn Rask, PT 04/02/2023, 11:38 AM

## 2023-04-09 ENCOUNTER — Ambulatory Visit: Payer: Medicare Other | Admitting: Family Medicine

## 2023-04-10 ENCOUNTER — Ambulatory Visit (INDEPENDENT_AMBULATORY_CARE_PROVIDER_SITE_OTHER): Payer: Medicare Other | Admitting: Family Medicine

## 2023-04-10 ENCOUNTER — Encounter: Payer: Self-pay | Admitting: Family Medicine

## 2023-04-10 VITALS — BP 122/80 | HR 66 | Ht 64.0 in | Wt 133.2 lb

## 2023-04-10 DIAGNOSIS — G8929 Other chronic pain: Secondary | ICD-10-CM

## 2023-04-10 DIAGNOSIS — M542 Cervicalgia: Secondary | ICD-10-CM | POA: Diagnosis not present

## 2023-04-10 DIAGNOSIS — M25511 Pain in right shoulder: Secondary | ICD-10-CM | POA: Diagnosis not present

## 2023-04-10 NOTE — Patient Instructions (Signed)
Thank you for coming in today.   Continue home exercises and heating pad as needed.   Next step for me would be MRI of your cervical spine for injection planning.   I can reorder PT in the future if needed.

## 2023-04-10 NOTE — Progress Notes (Signed)
   I, Stevenson Clinch, CMA acting as a scribe for Clementeen Graham, MD.  Wanda Rivera is a 82 y.o. female who presents to [facility] at [facility] today for f/u chronic neck and R shoulder pain. Pt was last seen by Dr. Denyse Amass on 03/13/23 and was advised to finish out PT, completing 9 visits (d/c on 4/2). Today, pt reports improvement of sx with physical therapy. Continues with HEP provided by PT. Sx have started to flare-up d/t recent weather. Has occasional soreness from the left ear to the shoulder. Taking Tylenol, using Icy Hot, heat and ice. Dry-needling was beneficial for sx.   Dx imaging: 03/13/23 R shoulder XR 01/04/2023 C-spine x-ray   Pertinent review of systems: No fevers or chills  Relevant historical information: History of L3 compression fracture.  History of breast cancer.   Exam:  BP 122/80   Pulse 66   Ht 5\' 4"  (1.626 m)   Wt 133 lb 3.2 oz (60.4 kg)   SpO2 100%   BMI 22.86 kg/m  General: Well Developed, well nourished, and in no acute distress.   MSK: C-spine: Normal appearing Mildly tender palpation left cervical paraspinal musculature. Decreased cervical motion.    Assessment and Plan: 82 y.o. 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  She is improved dramatically with physical therapy.  She has been now discharged from PT.  Plan to continue home exercise program.  If neck pain is not well-controlled enough next step could be MRI for injection planning.  She does not find that to be an appealing solution.  Could reauthorize PT in the future if needed as well she found dry needling to be very helpful.     Discussed warning signs or symptoms. Please see discharge instructions. Patient expresses understanding.   The above documentation has been reviewed and is accurate and complete Clementeen Graham, M.D.

## 2023-05-29 ENCOUNTER — Other Ambulatory Visit: Payer: Self-pay | Admitting: Gastroenterology

## 2023-06-18 ENCOUNTER — Encounter: Payer: Self-pay | Admitting: Family Medicine

## 2023-06-18 ENCOUNTER — Ambulatory Visit (INDEPENDENT_AMBULATORY_CARE_PROVIDER_SITE_OTHER): Payer: Medicare Other | Admitting: Family Medicine

## 2023-06-18 VITALS — BP 120/76 | HR 92 | Ht 64.0 in | Wt 133.6 lb

## 2023-06-18 DIAGNOSIS — M542 Cervicalgia: Secondary | ICD-10-CM

## 2023-06-18 NOTE — Patient Instructions (Signed)
Thank you for coming in today.   You should hear from MRI scheduling within 1 week. If you do not hear please let me know.    I will ask Dr Okey Dupre and Lillia Abed about the survivorship clinic.

## 2023-06-18 NOTE — Progress Notes (Unsigned)
   I, Stevenson Clinch, CMA acting as a scribe for Clementeen Graham, MD.  Wanda Rivera is a 82 y.o. female who presents to [facility] at [facility] today for cont'd neck pain. Pt was last seen by Dr. Denyse Amass on 04/10/23 and was advised to cont HEP. D/c from PT on April 2nd after completing 9 visits. Today, pt reports continued neck pain.   Dx imaging: 03/13/23 R shoulder XR 01/04/2023 C-spine x-ray  Pertinent review of systems: ***  Relevant historical information: ***   Exam:  There were no vitals taken for this visit. General: Well Developed, well nourished, and in no acute distress.   MSK: ***    Lab and Radiology Results No results found for this or any previous visit (from the past 72 hour(s)). No results found.     Assessment and Plan: 82 y.o. female with ***   PDMP not reviewed this encounter. No orders of the defined types were placed in this encounter.  No orders of the defined types were placed in this encounter.    Discussed warning signs or symptoms. Please see discharge instructions. Patient expresses understanding.   ***

## 2023-06-24 ENCOUNTER — Telehealth: Payer: Self-pay | Admitting: Family Medicine

## 2023-06-24 DIAGNOSIS — Z853 Personal history of malignant neoplasm of breast: Secondary | ICD-10-CM

## 2023-06-24 NOTE — Telephone Encounter (Signed)
-----   Message from Loa Socks, NP sent at 06/19/2023  8:36 AM EDT ----- Regarding: RE: Survivorship clinic? Happy to see her if she is interested in seeing me in survivorship.  I can review her BRCA mutation, update family history and see if she needs any intensified screening--sometimes patients can qualify for MRCP or MRI breasts if they choose to do so.  But it is a long discussion considering her age.  Some women feel better coming and having me do their breast exam.    If she wants the referral, please put it through--but request me specifically for survivorship and I will let our new patient scheduler know.    Thanks, LC ----- Message ----- From: Rodolph Bong, MD Sent: 06/18/2023   2:46 PM EDT To: Myrlene Broker, MD; # Subject: Survivorship clinic?                           I have seen Tayleigh a few times now for various musculoskeletal complaints.  She had breast cancer something like 40 years ago and has been in remission since but is paranoid (I would too) about a recurrence.  Dr. Okey Dupre is doing a good job with her primary care needs but I wonder if you seeing her in the survivorship clinic would be helpful? 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

## 2023-06-24 NOTE — Telephone Encounter (Signed)
Called pt and advised per Dr. Denyse Amass. Pt will contact the office if she does not hear from scheduling.

## 2023-06-24 NOTE — Telephone Encounter (Signed)
I spoke with the nurse practitioner in the survivorship clinic.  She will be happy to see you. I replaced referral to her today.  You should hear from her office soon.

## 2023-06-29 ENCOUNTER — Ambulatory Visit
Admission: RE | Admit: 2023-06-29 | Discharge: 2023-06-29 | Disposition: A | Discharge status: Home / Self Care | Payer: Medicare Other | Source: Ambulatory Visit | Attending: Family Medicine | Admitting: Family Medicine

## 2023-06-29 DIAGNOSIS — M9971 Connective tissue and disc stenosis of intervertebral foramina of cervical region: Secondary | ICD-10-CM | POA: Diagnosis not present

## 2023-06-29 DIAGNOSIS — M542 Cervicalgia: Secondary | ICD-10-CM

## 2023-06-29 DIAGNOSIS — M47812 Spondylosis without myelopathy or radiculopathy, cervical region: Secondary | ICD-10-CM | POA: Diagnosis not present

## 2023-07-08 NOTE — Progress Notes (Signed)
Cervical spine MRI shows arthritis and areas of potential pinched nerves.  This would explain some of the pain and noise you are experiencing.

## 2023-07-11 ENCOUNTER — Encounter: Payer: Self-pay | Admitting: Adult Health

## 2023-07-11 ENCOUNTER — Other Ambulatory Visit: Payer: Self-pay

## 2023-07-11 ENCOUNTER — Inpatient Hospital Stay: Payer: Medicare Other | Attending: Adult Health | Admitting: Adult Health

## 2023-07-11 VITALS — BP 140/61 | HR 63 | Temp 97.3°F | Resp 18 | Ht 64.0 in | Wt 131.3 lb

## 2023-07-11 DIAGNOSIS — Z853 Personal history of malignant neoplasm of breast: Secondary | ICD-10-CM | POA: Insufficient documentation

## 2023-07-11 DIAGNOSIS — L608 Other nail disorders: Secondary | ICD-10-CM

## 2023-07-11 DIAGNOSIS — Z9013 Acquired absence of bilateral breasts and nipples: Secondary | ICD-10-CM | POA: Diagnosis not present

## 2023-07-11 DIAGNOSIS — Z1501 Genetic susceptibility to malignant neoplasm of breast: Secondary | ICD-10-CM

## 2023-07-11 DIAGNOSIS — Z1509 Genetic susceptibility to other malignant neoplasm: Secondary | ICD-10-CM

## 2023-07-11 DIAGNOSIS — Z90710 Acquired absence of both cervix and uterus: Secondary | ICD-10-CM | POA: Diagnosis not present

## 2023-07-11 NOTE — Progress Notes (Signed)
[facility] Cancer Follow up:    Myrlene Broker, MD [address] [place] [address]   DIAGNOSIS: History of breast cancer  SUMMARY OF ONCOLOGIC HISTORY: Diagnosed 1982 and  1985 - mastectomy bilateral 1982 (left) and 1985 (right) Brca gene mutation +, s/p TAH and oophorectomy.  CURRENT THERAPY: observation  INTERVAL HISTORY: Wanda Rivera 82 y.o. female returns for f/u of her history of breast cancer fromt he 37s.  She underwent surgery alone and is s/p bilateral mastectomies.  She tells me that she underwent annual pet scan for about 15 years and then was released from follow up.  She denies any family history of colon cancer, pancreatic cancer, or skin cancer.  She does have several moles, and she also has some discoloration of her nails.  She says these areas have been there for many years.  She denies any changing areas in her skin, or any concerns at her mastectomy site.    Valyn is retired and spends time with her 24 year old granddaughter in her spare time.  She is up to date with seeing her PCP and up to date with her recommended cancer screenings.    Patient Active Problem List   Diagnosis Date Noted   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 07/11/2023   Pain in joint of right shoulder 03/13/2023   Neck swelling 01/01/2023   High vitamin D level 01/01/2023   Chronic neck pain 01/01/2023   Numbness of fingers of both hands 12/29/2021   Right sided abdominal pain 02/23/2020   Dysphagia 08/19/2019   Primary osteoarthritis of right knee 12/26/2018   Cough 12/19/2018   Constipation 12/19/2018   Rotator cuff arthropathy of right shoulder 04/02/2018   GERD (gastroesophageal reflux disease) 12/13/2017   History of breast cancer 01/02/2017   Compression fracture of L3 lumbar vertebra 05/04/2016   Hyperlipidemia 08/16/2015   Left carotid bruit 08/16/2015   Atherosclerotic peripheral vascular disease (HCC) 08/06/2015   Routine general medical  examination at a health care facility 12/09/2014   Osteopenia 12/09/2014   Glaucoma 12/09/2014    is allergic to darvon [propoxyphene], morphine and codeine, percocet [oxycodone-acetaminophen], and vicodin [hydrocodone-acetaminophen].  MEDICAL HISTORY: Past Medical History:  Diagnosis Date   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,hands   Breast cancer (HCC)    1982, recurrence in 1985   Cataract    Bil   Glaucoma    Bil   Osteoporosis     SURGICAL HISTORY: Past Surgical History:  Procedure Laterality Date   ABDOMINAL HYSTERECTOMY     BREAST SURGERY     double masectomy for breast cancer   eye surgery Bilateral 06/05/2017   for Glaucoma    SOCIAL HISTORY: Social History   Socioeconomic History   Marital status: Divorced    Spouse name: Not on file   Number of children: 1   Years of education: Not on file   Highest education level: Not on file  Occupational History   Occupation: retired  Tobacco Use   Smoking status: Never   Smokeless tobacco: Never  Vaping Use   Vaping status: Never Used  Substance and Sexual Activity   Alcohol use: Yes    Alcohol/week: 0.0 standard drinks of alcohol    Comment: occasionally a glass of red wine   Drug use: No   Sexual activity: Never  Other Topics Concern   Not on file  Social History Narrative   Not on file   Social Determinants of Health  Financial Resource Strain: Low Risk  (01/28/2023)   Overall Financial Resource Strain (CARDIA)    Difficulty of Paying Living Expenses: Not hard at all  Food Insecurity: No Food Insecurity (01/28/2023)   Hunger Vital Sign    Worried About Running Out of Food in the Last Year: Never true    Ran Out of Food in the Last Year: Never true  Transportation Needs: No Transportation Needs (01/28/2023)   PRAPARE - Administrator, Civil Service (Medical): No    Lack of Transportation (Non-Medical): No  Physical Activity: Insufficiently Active (01/28/2023)   Exercise Vital Sign    Days of  Exercise per Week: 2 days    Minutes of Exercise per Session: 20 min  Stress: Stress Concern Present (01/28/2023)   [employer] of Occupational Health - Occupational Stress Questionnaire    Feeling of Stress : To some extent  Social Connections: Moderately Isolated (01/28/2023)   Social Connection and Isolation Panel [NHANES]    Frequency of Communication with Friends and Family: More than three times a week    Frequency of Social Gatherings with Friends and Family: Three times a week    Attends Religious Services: More than 4 times per year    Active Member of Clubs or Organizations: No    Attends Banker Meetings: Never    Marital Status: Widowed  Intimate Partner Violence: Not At Risk (01/28/2023)   Humiliation, Afraid, Rape, and Kick questionnaire    Fear of Current or Ex-Partner: No    Emotionally Abused: No    Physically Abused: No    Sexually Abused: No    FAMILY HISTORY: Family History  Problem Relation Age of Onset   Cancer Mother    Breast cancer Mother    Liver cancer Mother    Diabetes Father    Hypertension Father    Hypertension Sister    Diabetes Brother    Heart disease Brother    Breast cancer Niece     Review of Systems  Constitutional:  Negative for appetite change, chills, fatigue, fever and unexpected weight change.  HENT:   Negative for hearing loss, lump/mass and trouble swallowing.   Eyes:  Negative for eye problems and icterus.  Respiratory:  Negative for chest tightness, cough and shortness of breath.   Cardiovascular:  Negative for chest pain, leg swelling and palpitations.  Gastrointestinal:  Negative for abdominal distention, abdominal pain, constipation, diarrhea, nausea and vomiting.  Endocrine: Negative for hot flashes.  Genitourinary:  Negative for difficulty urinating.   Musculoskeletal:  Negative for arthralgias.  Skin:  Negative for itching and rash.  Neurological:  Negative for dizziness, extremity weakness, headaches  and numbness.  Hematological:  Negative for adenopathy. Does not bruise/bleed easily.  Psychiatric/Behavioral:  Negative for depression. The patient is not nervous/anxious.       PHYSICAL EXAMINATION   Onc Performance Status - 07/11/23 1119       ECOG Perf Status   ECOG Perf Status Restricted in physically strenuous activity but ambulatory and able to carry out work of a light or sedentary nature, e.g., light house work, office work      KPS SCALE   KPS % SCORE Normal activity with effort, some s/s of disease             Vitals:   07/11/23 1107  BP: (!) 140/61  Pulse: 63  Resp: 18  Temp: (!) 97.3 F (36.3 C)  SpO2: 99%    Physical Exam Constitutional:  General: She is not in acute distress.    Appearance: Normal appearance. She is not toxic-appearing.  HENT:     Head: Normocephalic and atraumatic.     Mouth/Throat:     Mouth: Mucous membranes are moist.     Pharynx: Oropharynx is clear. No oropharyngeal exudate or posterior oropharyngeal erythema.  Eyes:     General: No scleral icterus. Cardiovascular:     Rate and Rhythm: Normal rate and regular rhythm.     Pulses: Normal pulses.     Heart sounds: Normal heart sounds.  Pulmonary:     Effort: Pulmonary effort is normal.     Breath sounds: Normal breath sounds.  Chest:     Comments: S/p bilateral mastectomies, no sign of local recurrence Abdominal:     General: Abdomen is flat. Bowel sounds are normal. There is no distension.     Palpations: Abdomen is soft.     Tenderness: There is no abdominal tenderness.  Musculoskeletal:        General: No swelling.     Cervical back: Neck supple.  Lymphadenopathy:     Cervical: No cervical adenopathy.  Skin:    General: Skin is warm and dry.     Findings: No rash.     Comments: Subungal discoloration under two of her nailbeds along stripe of the nail matrix  Neurological:     General: No focal deficit present.     Mental Status: She is alert.  Psychiatric:         Mood and Affect: Mood normal.        Behavior: Behavior normal.     LABORATORY DATA:  CBC    Component Value Date/Time   WBC 6.6 01/01/2023 1015   RBC 4.62 01/01/2023 1015   HGB 14.5 01/01/2023 1015   HCT 43.7 01/01/2023 1015   PLT 199.0 01/01/2023 1015   MCV 94.7 01/01/2023 1015   MCV 92.6 01/10/2017 1059   MCH 31.4 05/03/2019 1405   MCHC 33.3 01/01/2023 1015   RDW 14.3 01/01/2023 1015    CMP     Component Value Date/Time   NA 143 01/01/2023 1015   NA 142 01/02/2017 1235   K 4.3 01/01/2023 1015   CL 106 01/01/2023 1015   CO2 30 01/01/2023 1015   GLUCOSE 86 01/01/2023 1015   BUN 12 01/01/2023 1015   BUN 15 01/02/2017 1235   CREATININE 0.87 01/01/2023 1015   CREATININE 1.31 (H) 02/22/2015 1154   CALCIUM 10.0 01/01/2023 1015   PROT 7.3 01/01/2023 1015   PROT 6.5 01/02/2017 1235   ALBUMIN 4.4 01/01/2023 1015   ALBUMIN 4.4 01/02/2017 1235   AST 27 01/01/2023 1015   ALT 18 01/01/2023 1015   ALKPHOS 99 01/01/2023 1015   BILITOT 0.6 01/01/2023 1015   BILITOT 0.5 01/02/2017 1235   GFRNONAA 48 (L) 05/03/2019 1405   GFRNONAA 50 (L) 08/11/2014 1452   GFRAA 55 (L) 05/03/2019 1405   GFRAA 57 (L) 08/11/2014 1452       ASSESSMENT and THERAPY PLAN:   History of breast cancer Kade is an 82 year old woman with breast cancer from 54 and 1985 s/p bilateral mastectomies not requiring further treatment and intense imaging afterwards with annual PET scan that remained clear x 15 years.    She has no clinical signs of breast cancer recurrence.  Her breast exam is negative today.  I recommended that she continue with annual CW exams, and she would prefer to return here for long term  f/u which we are happy to accommodate.    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 She is s/p bilateral mastectomies and TAH/BSO.  She has no family history of pancreatic cancer, therefore MRCP is not indicated.    It is unclear if she has BRCA1 or BRCA2 mutation.  She is going to look through her  records at home and will let us know.  Due to her subungal discoloration, I sent a referral to dermatology for further evaluation.  The BRCA 2 mutation can increase her risk for melanoma development. She verbalized understanding of this.     All questions were answered. The patient knows to call the clinic with any problems, questions or concerns. We can certainly see the patient much sooner if necessary.  Total encounter time:60 minutes*in face-to-face visit time, chart review, lab review, care coordination, order entry, and documentation of the encounter time.   Lillard Anes, NP 07/11/23 12:19 PM Medical Oncology and Hematology [facility] [address] [place], [place] [address] Tel. [phone]    Fax. [phone]  *Total Encounter Time as defined by the Centers for Medicare and Medicaid Services includes, in addition to the face-to-face time of a patient visit (documented in the note above) non-face-to-face time: obtaining and reviewing outside history, ordering and reviewing medications, tests or procedures, care coordination (communications with other health care professionals or caregivers) and documentation in the medical record.

## 2023-07-11 NOTE — Assessment & Plan Note (Signed)
She is s/p bilateral mastectomies and TAH/BSO.  She has no family history of pancreatic cancer, therefore MRCP is not indicated.    It is unclear if she has BRCA1 or BRCA2 mutation.  She is going to look through her records at home and will let us know.  Due to her subungal discoloration, I sent a referral to dermatology for further evaluation.  The BRCA 2 mutation can increase her risk for melanoma development. She verbalized understanding of this.

## 2023-07-11 NOTE — Assessment & Plan Note (Signed)
Wanda Rivera is an 82 year old woman with breast cancer from 36 and 1985 s/p bilateral mastectomies not requiring further treatment and intense imaging afterwards with annual PET scan that remained clear x 15 years.    She has no clinical signs of breast cancer recurrence.  Her breast exam is negative today.  I recommended that she continue with annual CW exams, and she would prefer to return here for long term f/u which we are happy to accommodate.

## 2023-07-12 ENCOUNTER — Telehealth: Payer: Self-pay | Admitting: Adult Health

## 2023-07-12 NOTE — Progress Notes (Signed)
Dermatology referral & last OV note faxed to [facility] at [phone]. Fax confirmation received.

## 2023-07-12 NOTE — Telephone Encounter (Signed)
Scheduled appointment per 7/11 los. Patient is aware of the made appointment.

## 2023-07-16 ENCOUNTER — Encounter: Payer: Self-pay | Admitting: Family Medicine

## 2023-07-16 ENCOUNTER — Ambulatory Visit: Payer: Medicare Other | Admitting: Family Medicine

## 2023-07-16 VITALS — BP 122/80 | HR 76 | Ht 64.0 in | Wt 132.0 lb

## 2023-07-16 DIAGNOSIS — M542 Cervicalgia: Secondary | ICD-10-CM

## 2023-07-16 DIAGNOSIS — M25561 Pain in right knee: Secondary | ICD-10-CM

## 2023-07-16 DIAGNOSIS — G8929 Other chronic pain: Secondary | ICD-10-CM | POA: Diagnosis not present

## 2023-07-16 NOTE — Progress Notes (Signed)
I, Stevenson Clinch, CMA acting as a scribe for Clementeen Graham, MD.  Wanda Rivera is a 82 y.o. female who presents to [facility] today for f/u cervicalgia w/ MRI review. Pt was last seen by Dr. Denyse Amass on 06/18/23 and a c-spine MRI was ordered and they discussed the breast cancer.   Today, pt reports continued neck pain. Continues to have tightness on the left side of the neck, more recently has developed tightness on the right side of the neck. Feels swelling on the left side of the neck. Review MRI today. Also c/o right knee pain.  Hx of injury to the knee several years ago s/p fall. Swelling present. Has been using compression.   Dx imaging: 06/29/23 C-spine MRI 03/13/23  shoulder XR 01/04/2023 C-spine XR  Pertinent review of systems: No fevers or chills  Relevant historical information: History of breast cancer   Exam:  BP 122/80   Pulse 76   Ht 5\' 4"  (1.626 m)   Wt 132 lb (59.9 kg)   SpO2 96%   BMI 22.66 kg/m  General: Well Developed, well nourished, and in no acute distress.   MSK: C-spine normal appearing.  Normal motion. Intact strength.  Right knee normal-appearing normal motion.  Nontender.    Lab and Radiology Results  EXAM: MRI CERVICAL SPINE WITHOUT CONTRAST   TECHNIQUE: Multiplanar, multisequence MR imaging of the cervical spine was performed. No intravenous contrast was administered.   COMPARISON:  None Available.   FINDINGS: Alignment: There is reversal of the normal cervical lordosis.   Vertebrae: No fracture, evidence of discitis, or bone lesion. There appears to be incomplete segmentation at the C5-6 level with bridging bone seen across the disc interspace eccentric to the right.   Cord: Normal signal throughout.   Posterior Fossa, vertebral arteries, paraspinal tissues: Negative.   Disc levels:   C2-3: Negative.   C3-4: Shallow disc bulge and mild uncovertebral disease without stenosis.   C4-5: Left much  worse than right facet arthropathy with a shallow disc bulge. No stenosis.   C5-6: Negative.   C6-7: There is a disc osteophyte complex eccentric to the left and uncovertebral spurring on the left. Moderate left foraminal stenosis is present. The central canal and right foramen are open.   C7-T1: Shallow disc bulge without stenosis.   T1-2: Negative.   IMPRESSION: 1. 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 2. 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 3. 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     Electronically Signed   By: Drusilla Kanner M.D.   On: 07/06/2023 09:50   I, Clementeen Graham, personally (independently) visualized and performed the interpretation of the images attached in this note.      Assessment and Plan: 82 y.o. female with neck pain.  She has degenerative changes that certainly could contribute to neck pain.  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  Right now things seem to be okay enough.  Next step if needed would be facet injections at C4-5 if needed.  She will let me know if we need to proceed to that.  Knee pain doing pretty well right now.  Watchful waiting.  Proceed with x-ray injection if needed in the future.  Recheck with me as needed.  Total encounter time 30 minutes including face-to-face time with the patient and, reviewing  past medical record, and charting on the date of service.    PDMP not reviewed this encounter. No orders of the defined types were placed in this encounter.  No orders of the defined types were placed in this encounter.    Discussed warning signs or symptoms. Please see discharge instructions. Patient expresses understanding.   The above documentation has been reviewed and is accurate and complete Clementeen Graham, M.D.

## 2023-07-16 NOTE — Patient Instructions (Signed)
Thank you for coming in today.   Let me know if you want to try neck injections in the future  Check back as needed

## 2023-07-19 DIAGNOSIS — H401123 Primary open-angle glaucoma, left eye, severe stage: Secondary | ICD-10-CM | POA: Diagnosis not present

## 2023-08-23 ENCOUNTER — Encounter: Payer: Self-pay | Admitting: Gastroenterology

## 2023-08-23 ENCOUNTER — Ambulatory Visit: Payer: Medicare Other | Admitting: Gastroenterology

## 2023-08-23 VITALS — BP 110/70 | HR 56 | Ht 64.0 in | Wt 130.1 lb

## 2023-08-23 DIAGNOSIS — K5909 Other constipation: Secondary | ICD-10-CM | POA: Diagnosis not present

## 2023-08-23 DIAGNOSIS — R1031 Right lower quadrant pain: Secondary | ICD-10-CM | POA: Diagnosis not present

## 2023-08-23 NOTE — Progress Notes (Signed)
Chief Complaint: Chronic intermittent constipation and RLQ pain Primary GI MD: Dr. Lavon Paganini  HPI: 82 year old female with medical history as listed below presents for evaluation of chronic constipation and intermittent RLQ pain ongoing since 2021.  Last seen 08/2022 by Doug Sou for intermittent right sided pain.  Ongoing since early 2021.  Extensive negative workup including ultrasound, CT scans, imaging of her back, HIDA scan without definitive cause of pain (see below).  Thought to be possibly musculoskeletal. Tylenol helps.  Patient does have history of constipation for which she takes MiraLAX and prune juice.  Patient states she was given Linzess samples and did well with these but her insurance does not cover it and it is too expensive for her to buy.  She takes MiraLAX on a regular basis and does well but if she stops the MiraLAX she gets constipated again.  Still having intermittent RLQ pain.  She takes Tylenol for this which improves her pain.  Denies melena/hematochezia.  Denies weight loss.  Patient is very active and typically walks around her neighborhood.   PREVIOUS GI WORKUP   Right upper quadrant ultrasound April 27, 2020: Diminutive 2 mm gallbladder polyp otherwise no cholecystitis   CT abdomen pelvis March 11, 2020: Mild to moderate colonic stool burden otherwise unremarkable   HIDA scan 10/2020 was normal.   RUQ abdominal ultrasound 04/2021:  Stable/unchanged 2 mm gallbladder polyp.   EGD February 20, 2017: Normal esophagus, mild gastritis biopsies negative for H. pylori, duodenal erosions otherwise normal.   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      BIOTIN PO Take 1,000 mcg by mouth daily.      brimonidine (ALPHAGAN) 0.2 % ophthalmic solution Place 1 drop into the right eye in the morning and at bedtime.     Calcium Carbonate (CALCIUM 600 PO) Take 1 tablet by mouth in the morning and at bedtime.     Cyanocobalamin (VITAMIN B 12 PO) Take 500 mg by mouth daily.     diclofenac Sodium (VOLTAREN) 1 % GEL Apply 4 g topically 4 (four) times daily. 100 g 0   dicyclomine (BENTYL) 10 MG capsule TAKE 1 CAPSULE TWICE DAILY AS NEEDED FOR SPASMS 180 capsule 3   dorzolamide-timolol (COSOPT) 22.3-6.8 MG/ML ophthalmic solution Place 1 drop into both eyes 2 (two) times daily.     DULoxetine (CYMBALTA) 20 MG capsule TAKE 1 CAPSULE EVERY DAY 90 capsule 2   latanoprost (XALATAN) 0.005 % ophthalmic solution Place 1 drop into both eyes at bedtime.     Latanoprostene Bunod (VYZULTA OP) Place 1 drop into the right eye daily.     lidocaine (LIDODERM) 5 % Place 1 patch onto the skin daily. Remove & Discard patch within 12 hours or as directed by MD 30 patch  0   meclizine (ANTIVERT) 12.5 MG tablet Take 12.5 mg by mouth 3 (three) times daily as needed for dizziness.      Multiple Vitamin (ONE-A-DAY ESSENTIAL PO) Take by mouth daily.     Omega-3 1000 MG CAPS Take 1 capsule by mouth daily.      Omega-3 Fatty Acids (FISH OIL) 500 MG CAPS Take by mouth.     omeprazole (PRILOSEC) 40 MG capsule TAKE 1 CAPSULE TWICE DAILY 180 capsule 3   polyethylene glycol (MIRALAX / GLYCOLAX) 17 g packet Take 17 g by mouth as needed.     rosuvastatin (CRESTOR) 10 MG tablet Take 1 tablet (10 mg total) by mouth daily. 90 tablet 3   Turmeric 500 MG TABS Take 1 tablet by mouth daily.      No current  facility-administered medications for this visit.    Allergies as of 08/23/2023 - Review Complete 07/16/2023  Allergen Reaction Noted   Darvon [propoxyphene]  02/28/2016   Morphine and codeine  02/28/2016   Percocet [oxycodone-acetaminophen]  02/28/2016   Vicodin [hydrocodone-acetaminophen]  02/28/2016    Family History  Problem Relation Age of Onset   Cancer Mother    Breast cancer Mother    Liver cancer Mother    Diabetes Father    Hypertension Father    Hypertension Sister    Diabetes Brother    Heart disease Brother    Breast cancer Niece     Social History   Socioeconomic History   Marital status: Divorced    Spouse name: Not on file   Number of children: 1   Years of education: Not on file   Highest education level: Not on file  Occupational History   Occupation: retired  Tobacco Use   Smoking status: Never   Smokeless tobacco: Never  Vaping Use   Vaping status: Never Used  Substance and Sexual Activity   Alcohol use: Yes    Alcohol/week: 0.0 standard drinks of alcohol    Comment: occasionally a glass of red wine   Drug use: No   Sexual activity: Never  Other Topics Concern   Not on file  Social History Narrative   Not on file   Social Determinants of Health   Financial Resource Strain: Low Risk  (01/28/2023)   Overall Financial Resource Strain (CARDIA)    Difficulty of Paying Living Expenses: Not hard at all  Food Insecurity: No Food Insecurity (01/28/2023)   Hunger Vital Sign    Worried About Running Out of Food in the Last Year: Never true    Ran Out of Food in the Last Year: Never true  Transportation Needs: No Transportation Needs (01/28/2023)   PRAPARE - Administrator, Civil Service (Medical): No    Lack of Transportation (Non-Medical): No  Physical Activity: Insufficiently Active (01/28/2023)   Exercise Vital Sign    Days of Exercise per Week: 2 days    Minutes of Exercise per Session: 20 min  Stress: Stress Concern Present  (01/28/2023)   [employer] of Occupational Health - Occupational Stress Questionnaire    Feeling of Stress : To some extent  Social Connections: Moderately Isolated (01/28/2023)   Social Connection and Isolation Panel [NHANES]    Frequency of Communication with Friends and Family: More than three times a week    Frequency of Social Gatherings with Friends and Family: Three times a week    Attends Religious Services: More than 4 times per year    Active Member of Clubs  or Organizations: No    Attends Banker Meetings: Never    Marital Status: Widowed  Intimate Partner Violence: Not At Risk (01/28/2023)   Humiliation, Afraid, Rape, and Kick questionnaire    Fear of Current or Ex-Partner: No    Emotionally Abused: No    Physically Abused: No    Sexually Abused: No    Review of Systems:    Constitutional: No weight loss, fever, chills, weakness or fatigue HEENT: Eyes: No change in vision               Ears, Nose, Throat:  No change in hearing or congestion Skin: No rash or itching Cardiovascular: No chest pain, chest pressure or palpitations   Respiratory: No SOB or cough Gastrointestinal: See HPI and otherwise negative Genitourinary: No dysuria or change in urinary frequency Neurological: No headache, dizziness or syncope Musculoskeletal: No new muscle or joint pain Hematologic: No bleeding or bruising Psychiatric: No history of depression or anxiety    Physical Exam:  Vital signs: There were no vitals taken for this visit.  Constitutional: NAD, Well developed, Well nourished, alert and cooperative.  Appears significantly younger than stated age Head:  Normocephalic and atraumatic. Eyes:   PEERL, EOMI. No icterus. Conjunctiva pink. Respiratory: Respirations even and unlabored. Lungs clear to auscultation bilaterally.   No wheezes, crackles, or rhonchi.  Cardiovascular:  Regular rate and rhythm. No peripheral edema, cyanosis or pallor.  Gastrointestinal:  Soft,  nondistended, nontender. No rebound or guarding. Normal bowel sounds. No appreciable masses or hepatomegaly. Rectal:  Not performed.  Msk:  Symmetrical without gross deformities. Without edema, no deformity or joint abnormality.  Neurologic:  Alert and  oriented x4;  grossly normal neurologically.  Skin:   Dry and intact without significant lesions or rashes. Psychiatric: Oriented to person, place and time. Demonstrates good judgement and reason without abnormal affect or behaviors.   RELEVANT LABS AND IMAGING: CBC    Component Value Date/Time   WBC 6.6 01/01/2023 1015   RBC 4.62 01/01/2023 1015   HGB 14.5 01/01/2023 1015   HCT 43.7 01/01/2023 1015   PLT 199.0 01/01/2023 1015   MCV 94.7 01/01/2023 1015   MCV 92.6 01/10/2017 1059   MCH 31.4 05/03/2019 1405   MCHC 33.3 01/01/2023 1015   RDW 14.3 01/01/2023 1015    CMP     Component Value Date/Time   NA 143 01/01/2023 1015   NA 142 01/02/2017 1235   K 4.3 01/01/2023 1015   CL 106 01/01/2023 1015   CO2 30 01/01/2023 1015   GLUCOSE 86 01/01/2023 1015   BUN 12 01/01/2023 1015   BUN 15 01/02/2017 1235   CREATININE 0.87 01/01/2023 1015   CREATININE 1.31 (H) 02/22/2015 1154   CALCIUM 10.0 01/01/2023 1015   PROT 7.3 01/01/2023 1015   PROT 6.5 01/02/2017 1235   ALBUMIN 4.4 01/01/2023 1015   ALBUMIN 4.4 01/02/2017 1235   AST 27 01/01/2023 1015   ALT 18 01/01/2023 1015   ALKPHOS 99 01/01/2023 1015   BILITOT 0.6 01/01/2023 1015   BILITOT 0.5 01/02/2017 1235   GFRNONAA 48 (L) 05/03/2019 1405   GFRNONAA 50 (L) 08/11/2014 1452   GFRAA 55 (L) 05/03/2019 1405   GFRAA 57 (L) 08/11/2014 1452     Assessment/Plan:   Other constipation Well-controlled when she is on MiraLAX regularly but constipation returns if she stops MiraLAX. ---Start taking Miralax 1 capful (17 grams) 1x / day for 1 week.   If this is not effective, increase  to 1 dose 2x / day for 1 week.   If this is still not effective, increase to two capfuls (34 grams) 2x  / day.   Can adjust dose as needed based on response. Can take 1/2 cap daily, skip days, or increase per day.   --- do miralax on a consistent basis to prevent constipation --- drink 60-100 oz water daily --- squatty potty  RLQ abdominal pain Thought to be musculoskeletal since pain improves with Tylenol.  Could possibly be constipation related as well but more likely musculoskeletal.  Provided IBgard samples to trial.  If they do not work she can continue Tylenol  Boone Master, [facility] 08/23/2023, 8:34 AM  Cc: Myrlene Broker, *

## 2023-08-23 NOTE — Patient Instructions (Addendum)
Start taking Miralax 1 capful (17 grams) 1x / day for 1 week.   If this is not effective, increase to 1 dose 2x / day for 1 week.   If this is still not effective, increase to two capfuls (34 grams) 2x / day.   Can adjust dose as needed based on response. Can take 1/2 cap daily, skip days, or increase per day.    Squatty potty from [employer] or [place]  IB gard as needed for abdominal pain  _______________________________________________________  If your blood pressure at your visit was 140/90 or greater, please contact your primary care physician to follow up on this.  _______________________________________________________  If you are age 34 or older, your body mass index should be between 23-30. Your Body mass index is 22.34 kg/m. If this is out of the aforementioned range listed, please consider follow up with your Primary Care Provider.  If you are age 67 or younger, your body mass index should be between 19-25. Your Body mass index is 22.34 kg/m. If this is out of the aformentioned range listed, please consider follow up with your Primary Care Provider.   ________________________________________________________  The [facility] providers would like to encourage you to use [facility] to communicate with providers for non-urgent requests or questions.  Due to long hold times on the telephone, sending your provider a message by [facility] may be a faster and more efficient way to get a response.  Please allow 48 business hours for a response.  Please remember that this is for non-urgent requests.  _______________________________________________________   I appreciate the  opportunity to care for you  Thank You   [facility]

## 2023-08-26 ENCOUNTER — Other Ambulatory Visit: Payer: Self-pay | Admitting: Family Medicine

## 2023-09-26 DIAGNOSIS — D2262 Melanocytic nevi of left upper limb, including shoulder: Secondary | ICD-10-CM | POA: Diagnosis not present

## 2023-09-26 DIAGNOSIS — L603 Nail dystrophy: Secondary | ICD-10-CM | POA: Diagnosis not present

## 2023-09-26 DIAGNOSIS — L821 Other seborrheic keratosis: Secondary | ICD-10-CM | POA: Diagnosis not present

## 2023-09-26 DIAGNOSIS — L570 Actinic keratosis: Secondary | ICD-10-CM | POA: Diagnosis not present

## 2023-09-26 DIAGNOSIS — L819 Disorder of pigmentation, unspecified: Secondary | ICD-10-CM | POA: Diagnosis not present

## 2023-09-26 DIAGNOSIS — D2272 Melanocytic nevi of left lower limb, including hip: Secondary | ICD-10-CM | POA: Diagnosis not present

## 2023-09-26 DIAGNOSIS — D485 Neoplasm of uncertain behavior of skin: Secondary | ICD-10-CM | POA: Diagnosis not present

## 2023-10-01 DIAGNOSIS — Z23 Encounter for immunization: Secondary | ICD-10-CM | POA: Diagnosis not present

## 2023-11-08 DIAGNOSIS — H401123 Primary open-angle glaucoma, left eye, severe stage: Secondary | ICD-10-CM | POA: Diagnosis not present

## 2024-01-07 ENCOUNTER — Ambulatory Visit: Payer: Medicare Other | Admitting: Internal Medicine

## 2024-01-07 ENCOUNTER — Encounter: Payer: Self-pay | Admitting: Internal Medicine

## 2024-01-07 VITALS — BP 140/88 | HR 74 | Temp 98.0°F | Ht 64.0 in | Wt 133.0 lb

## 2024-01-07 DIAGNOSIS — I70209 Unspecified atherosclerosis of native arteries of extremities, unspecified extremity: Secondary | ICD-10-CM | POA: Diagnosis not present

## 2024-01-07 DIAGNOSIS — E673 Hypervitaminosis D: Secondary | ICD-10-CM | POA: Diagnosis not present

## 2024-01-07 DIAGNOSIS — K219 Gastro-esophageal reflux disease without esophagitis: Secondary | ICD-10-CM | POA: Diagnosis not present

## 2024-01-07 DIAGNOSIS — E782 Mixed hyperlipidemia: Secondary | ICD-10-CM

## 2024-01-07 DIAGNOSIS — R109 Unspecified abdominal pain: Secondary | ICD-10-CM | POA: Diagnosis not present

## 2024-01-07 LAB — LIPID PANEL
Cholesterol: 158 mg/dL (ref 0–200)
HDL: 53.9 mg/dL (ref >39.00)
LDL Cholesterol: 89 mg/dL (ref 0–99)
NonHDL: 103.78
Total CHOL/HDL Ratio: 3
Triglycerides: 73 mg/dL (ref 0.0–149.0)
VLDL: 14.6 mg/dL (ref 0.0–40.0)

## 2024-01-07 LAB — CBC
HCT: 43 % (ref 36.0–46.0)
Hemoglobin: 14.2 g/dL (ref 12.0–15.0)
MCHC: 33 g/dL (ref 30.0–36.0)
MCV: 96.1 fL (ref 78.0–100.0)
Platelets: 182 10*3/uL (ref 150.0–400.0)
RBC: 4.48 Mil/uL (ref 3.87–5.11)
RDW: 13.9 % (ref 11.5–15.5)
WBC: 5.5 10*3/uL (ref 4.0–10.5)

## 2024-01-07 LAB — COMPREHENSIVE METABOLIC PANEL
ALT: 16 U/L (ref 0–35)
AST: 26 U/L (ref 0–37)
Albumin: 4.4 g/dL (ref 3.5–5.2)
Alkaline Phosphatase: 96 U/L (ref 39–117)
BUN: 11 mg/dL (ref 6–23)
CO2: 32 meq/L (ref 19–32)
Calcium: 9.6 mg/dL (ref 8.4–10.5)
Chloride: 105 meq/L (ref 96–112)
Creatinine, Ser: 0.95 mg/dL (ref 0.40–1.20)
GFR: 56.26 mL/min — ABNORMAL LOW (ref >60.00)
Glucose, Bld: 89 mg/dL (ref 70–99)
Potassium: 4.8 meq/L (ref 3.5–5.1)
Sodium: 142 meq/L (ref 135–145)
Total Bilirubin: 0.6 mg/dL (ref 0.2–1.2)
Total Protein: 7 g/dL (ref 6.0–8.3)

## 2024-01-07 LAB — VITAMIN D 25 HYDROXY (VIT D DEFICIENCY, FRACTURES): VITD: 76.28 ng/mL (ref 30.00–100.00)

## 2024-01-07 MED ORDER — ROSUVASTATIN CALCIUM 10 MG PO TABS: 10.0000 mg | ORAL_TABLET | Freq: Every day | ORAL | 3 refills | Status: DC

## 2024-01-07 NOTE — Progress Notes (Signed)
   Subjective:   Patient ID: Wanda Rivera, female    DOB: Jun 13, 1941, 83 y.o.   MRN: [id number]  HPI The patient is an 83 YO female coming in for pain all over and follow up medical conditions see A/P for details.   Review of Systems  Constitutional: Negative.   HENT: Negative.    Eyes: Negative.   Respiratory:  Negative for cough, chest tightness and shortness of breath.   Cardiovascular:  Negative for chest pain, palpitations and leg swelling.  Gastrointestinal:  Negative for abdominal distention, abdominal pain, constipation, diarrhea, nausea and vomiting.  Musculoskeletal:  Positive for arthralgias and myalgias.  Skin: Negative.   Neurological: Negative.   Psychiatric/Behavioral: Negative.      Objective:  Physical Exam Constitutional:      Appearance: She is well-developed.  HENT:     Head: Normocephalic and atraumatic.  Cardiovascular:     Rate and Rhythm: Normal rate and regular rhythm.  Pulmonary:     Effort: Pulmonary effort is normal. No respiratory distress.     Breath sounds: Normal breath sounds. No wheezing or rales.  Abdominal:     General: Bowel sounds are normal. There is no distension.     Palpations: Abdomen is soft.     Tenderness: There is no abdominal tenderness. There is no rebound.  Musculoskeletal:        General: Tenderness present.     Cervical back: Normal range of motion.  Skin:    General: Skin is warm and dry.  Neurological:     Mental Status: She is alert and oriented to person, place, and time.     Coordination: Coordination normal.     Vitals:   01/07/24 0944 01/07/24 0946  BP: (!) 140/88 (!) 140/88  Pulse: 74   Temp: 98 F (36.7 C)   TempSrc: Oral   SpO2: 95%   Weight: 133 lb (60.3 kg)   Height: 5' 4 (1.626 m)     Assessment & Plan:

## 2024-01-07 NOTE — Patient Instructions (Signed)
 We will check the labs today.

## 2024-01-10 NOTE — Assessment & Plan Note (Signed)
 Still intermittent and no clear etiology. Suspect prior compression fracture may be related. She is using otc which helps some.

## 2024-01-10 NOTE — Assessment & Plan Note (Signed)
 Checking lipid panel and taking crestor 10 mg daily.

## 2024-01-10 NOTE — Assessment & Plan Note (Signed)
Checking lipid panel and adjust crestor 10 mg daily as needed. 

## 2024-01-10 NOTE — Assessment & Plan Note (Signed)
 Checking vitamin D for stability and adjust as needed.

## 2024-01-10 NOTE — Assessment & Plan Note (Signed)
 Taking prilosec 40 mg BID and will continue. Symptoms controlled.

## 2024-01-30 ENCOUNTER — Ambulatory Visit: Payer: Medicare Other

## 2024-01-30 VITALS — BP 130/75 | HR 57 | Ht 64.25 in | Wt 133.4 lb

## 2024-01-30 DIAGNOSIS — Z780 Asymptomatic menopausal state: Secondary | ICD-10-CM

## 2024-01-30 DIAGNOSIS — Z0000 Encounter for general adult medical examination without abnormal findings: Secondary | ICD-10-CM

## 2024-01-30 NOTE — Progress Notes (Signed)
Subjective:   Wanda Rivera is a 83 y.o. female who presents for Medicare Annual (Subsequent) preventive examination.  Visit Complete: In person    Cardiac Risk Factors include: advanced age (>21men, >37 women);dyslipidemia     Objective:    Today's Vitals   01/30/24 1016  BP: 130/75  Pulse: (!) 57  SpO2: 100%  Weight: 133 lb 6.4 oz (60.5 kg)  Height: 5' 4.25" (1.632 m)   Body mass index is 22.72 kg/m.     01/30/2024   10:26 AM 02/21/2023    9:33 AM 01/28/2023    9:13 AM 01/16/2022    1:52 PM 01/02/2021   10:07 AM 11/10/2020   10:06 PM 05/04/2020    8:56 AM  Advanced Directives  Does Patient Have a Medical Advance Directive? Yes No Yes Yes Yes No No  Type of Estate agent of [place];Living will  Healthcare Power of [place];Living will Living will;Healthcare Power of Attorney Living will;Healthcare Power of Attorney    Does patient want to make changes to medical advance directive?   No - Patient declined No - Patient declined No - Patient declined    Copy of Healthcare Power of Attorney in Chart? No - copy requested  No - copy requested No - copy requested No - copy requested    Would patient like information on creating a medical advance directive?      No - Patient declined No - Patient declined    Current Medications (verified) Outpatient Encounter Medications as of 01/30/2024  Medication Sig   acetaminophen (TYLENOL) 500 MG tablet Take 1,000 mg by mouth every 6 (six) hours as needed for moderate pain or headache.    BIOTIN PO Take 1,000 mcg by mouth daily.    brimonidine (ALPHAGAN) 0.2 % ophthalmic solution Place 1 drop into the right eye in the morning and at bedtime.   Calcium Carbonate (CALCIUM 600 PO) Take 1 tablet by mouth in the morning and at bedtime.   Cyanocobalamin (VITAMIN B 12 PO) Take 500 mg by mouth daily.   diclofenac Sodium (VOLTAREN) 1 % GEL Apply 4 g topically 4 (four) times daily.   dicyclomine (BENTYL) 10 MG capsule TAKE 1  CAPSULE TWICE DAILY AS NEEDED FOR SPASMS   dorzolamide-timolol (COSOPT) 22.3-6.8 MG/ML ophthalmic solution Place 1 drop into both eyes 2 (two) times daily.   DULoxetine (CYMBALTA) 20 MG capsule TAKE 1 CAPSULE EVERY DAY   latanoprost (XALATAN) 0.005 % ophthalmic solution Place 1 drop into both eyes at bedtime.   Latanoprostene Bunod (VYZULTA OP) Place 1 drop into the right eye daily.   meclizine (ANTIVERT) 12.5 MG tablet Take 12.5 mg by mouth 3 (three) times daily as needed for dizziness.    Multiple Vitamin (ONE-A-DAY ESSENTIAL PO) Take by mouth daily.   Omega-3 Fatty Acids (FISH OIL) 500 MG CAPS Take by mouth.   omeprazole (PRILOSEC) 40 MG capsule TAKE 1 CAPSULE TWICE DAILY   polyethylene glycol (MIRALAX / GLYCOLAX) 17 g packet Take 17 g by mouth as needed.   rosuvastatin (CRESTOR) 10 MG tablet Take 1 tablet (10 mg total) by mouth daily.   Turmeric 500 MG TABS Take 1 tablet by mouth daily.    lidocaine (LIDODERM) 5 % Place 1 patch onto the skin daily. Remove & Discard patch within 12 hours or as directed by MD (Patient not taking: Reported on 01/30/2024)   Omega-3 1000 MG CAPS Take 1 capsule by mouth daily.  (Patient not taking: Reported on 01/30/2024)  No facility-administered encounter medications on file as of 01/30/2024.    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,hands   Breast cancer (HCC)    1982, recurrence in 1985   Cataract    Bil   Glaucoma    Bil   Osteoporosis    Past Surgical History:  Procedure Laterality Date   ABDOMINAL HYSTERECTOMY     BREAST SURGERY     double masectomy for breast cancer   eye surgery Bilateral 06/05/2017   for Glaucoma   Family History  Problem Relation Age of Onset   Cancer Mother    Breast cancer Mother    Liver cancer Mother    Diabetes Father     Hypertension Father    Hypertension Sister    Diabetes Brother    Heart disease Brother    Breast cancer Niece    Social History   Socioeconomic History   Marital status: Divorced    Spouse name: Not on file   Number of children: 1   Years of education: Not on file   Highest education level: Not on file  Occupational History   Occupation: retired  Tobacco Use   Smoking status: Never   Smokeless tobacco: Never  Vaping Use   Vaping status: Never Used  Substance and Sexual Activity   Alcohol use: Yes    Alcohol/week: 0.0 standard drinks of alcohol    Comment: occasionally a glass of red wine   Drug use: No   Sexual activity: Never  Other Topics Concern   Not on file  Social History Narrative   Lives alone.   Social Drivers of Corporate investment banker Strain: Low Risk  (01/30/2024)   Overall Financial Resource Strain (CARDIA)    Difficulty of Paying Living Expenses: Not hard at all  Food Insecurity: No Food Insecurity (01/30/2024)   Hunger Vital Sign    Worried About Running Out of Food in the Last Year: Never true    Ran Out of Food in the Last Year: Never true  Transportation Needs: No Transportation Needs (01/30/2024)   PRAPARE - Administrator, Civil Service (Medical): No    Lack of Transportation (Non-Medical): No  Physical Activity: Insufficiently Active (01/30/2024)   Exercise Vital Sign    Days of Exercise per Week: 3 days    Minutes of Exercise per Session: 30 min  Stress: No Stress Concern Present (01/30/2024)   [employer] of Occupational Health - Occupational Stress Questionnaire    Feeling of Stress : Not at all  Social Connections: Moderately Integrated (01/30/2024)   Social Connection and Isolation Panel [NHANES]    Frequency of Communication with Friends and Family: More than three times a week    Frequency of Social Gatherings with Friends and Family: Three times a week    Attends Religious Services: More than 4 times per year     Active Member of Clubs or Organizations: Yes    Attends Banker Meetings: Never    Marital Status: Divorced    Tobacco Counseling Counseling given: Not Answered   Clinical Intake:  Pre-visit preparation completed: Yes  Pain : No/denies pain     BMI - recorded: 22.72 Nutritional Status: BMI of 19-24  Normal Nutritional Risks: None Diabetes: No  How often do you need to have someone help you when you read instructions, pamphlets,  or other written materials from your doctor or pharmacy?: 1 - Never  Interpreter Needed?: No  Information entered by :: Graycee Greeson, RMA   Activities of Daily Living    01/30/2024   10:20 AM  In your present state of health, do you have any difficulty performing the following activities:  Hearing? 0  Vision? 0  Difficulty concentrating or making decisions? 0  Walking or climbing stairs? 0  Dressing or bathing? 0  Doing errands, shopping? 0  Preparing Food and eating ? N  Using the Toilet? N  In the past six months, have you accidently leaked urine? N  Do you have problems with loss of bowel control? N  Managing your Medications? N  Managing your Finances? N  Housekeeping or managing your Housekeeping? N    Patient Care Team: Myrlene Broker, MD as PCP - General (Internal Medicine) Yates Decamp, MD as Consulting Physician (Cardiology) [facility] as Consulting Physician (Ophthalmology) Napoleon Form, MD as Consulting Physician (Gastroenterology) Rodolph Bong, MD as Consulting Physician (Sports Medicine) Haynes Hoehn, MD as Consulting Physician (Ophthalmology)  Indicate any recent Medical Services you may have received from other than [facility] providers in the past year (date may be approximate).     Assessment:   This is a routine wellness examination for Verle.  Hearing/Vision screen Hearing Screening - Comments:: Denies hearing difficulties   Vision Screening - Comments:: Denies vision  issues.    Goals Addressed             This Visit's Progress    Patient Stated   On track    Increase my physical activity by getting back on my routine of walking and exercising on my eliptical.      Depression Screen    01/30/2024   10:49 AM 01/29/2023    1:10 PM 01/28/2023    9:08 AM 01/01/2023    9:21 AM 01/16/2022    2:00 PM 01/02/2021   10:12 AM 12/21/2020   10:04 AM  PHQ 2/9 Scores  PHQ - 2 Score 0 0 1 0 0 0 1  PHQ- 9 Score 0 0  0   6    Fall Risk    01/30/2024   10:30 AM 01/07/2024    9:46 AM 01/29/2023    1:10 PM 01/28/2023    9:13 AM 01/01/2023    9:20 AM  Fall Risk   Falls in the past year? 0 0 0 1 0  Number falls in past yr: 0 0 0 0 0  Injury with Fall? 0 0 0 0 0  Risk for fall due to : No Fall Risks  No Fall Risks History of fall(s)   Follow up Falls prevention discussed;Falls evaluation completed Falls evaluation completed Falls evaluation completed Falls evaluation completed Falls evaluation completed    MEDICARE RISK AT HOME: Medicare Risk at Home Any stairs in or around the home?: Yes If so, are there any without handrails?: Yes Home free of loose throw rugs in walkways, pet beds, electrical cords, etc?: Yes Adequate lighting in your home to reduce risk of falls?: Yes Life alert?: No Use of a cane, walker or w/c?: No Grab bars in the bathroom?: No Shower chair or bench in shower?: No Elevated toilet seat or a handicapped toilet?: No  TIMED UP AND GO:  Was the test performed?  Yes  Length of time to ambulate 10 feet: 10 sec Gait steady and fast without use of assistive device  Cognitive Function:    12/17/2018   10:57 AM  MMSE - Mini Mental State Exam  Orientation to time 5  Orientation to Place 5  Registration 3  Attention/ Calculation 4  Recall 2  Language- name 2 objects 2  Language- repeat 1  Language- follow 3 step command 3  Language- read & follow direction 1  Write a sentence 1  Copy design 1  Total score 28         01/30/2024   10:12 AM 01/28/2023    9:16 AM  6CIT Screen  What Year? 0 points 0 points  What month? 0 points 0 points  What time? 0 points 0 points  Count back from 20 0 points 0 points  Months in reverse 0 points 0 points  Repeat phrase 0 points 0 points  Total Score 0 points 0 points    Immunizations Immunization History  Administered Date(s) Administered   Influenza Whole 09/30/2014   Influenza, High Dose Seasonal PF 09/14/2019, 09/18/2021, 09/19/2022   Influenza-Unspecified 09/30/2015, 09/13/2017, 09/19/2018, 09/14/2020   PFIZER(Purple Top)SARS-COV-2 Vaccination 03/15/2020, 04/05/2020, 11/01/2020   Pfizer Covid-19 Vaccine Bivalent Booster 37yrs & up 09/18/2021, 10/09/2022   Pneumococcal Conjugate-13 09/30/2015   Pneumococcal Polysaccharide-23 12/10/2016   Pneumococcal-Unspecified 06/12/2017   Tdap 12/09/2015   Zoster Recombinant(Shingrix) 03/05/2022   Zoster, Live 01/01/2012    TDAP status: Up to date  Flu Vaccine status: Up to date  Pneumococcal vaccine status: Up to date  Covid-19 vaccine status: Completed vaccines  Qualifies for Shingles Vaccine? Yes   Zostavax completed Yes   Shingrix Completed?: No.    Education has been provided regarding the importance of this vaccine. Patient has been advised to call insurance company to determine out of pocket expense if they have not yet received this vaccine. Advised may also receive vaccine at local pharmacy or Health Dept. Verbalized acceptance and understanding.  Screening Tests Health Maintenance  Topic Date Due   Zoster Vaccines- Shingrix (2 of 2) 04/30/2022   COVID-19 Vaccine (6 - 2024-25 season) 09/01/2023   Medicare Annual Wellness (AWV)  01/29/2025   DTaP/Tdap/Td (2 - Td or Tdap) 12/08/2025   Pneumonia Vaccine 80+ Years old  Completed   INFLUENZA VACCINE  Completed   DEXA SCAN  Completed   HPV VACCINES  Aged Out   Hepatitis C Screening  Discontinued    Health Maintenance  Health Maintenance Due  Topic  Date Due   Zoster Vaccines- Shingrix (2 of 2) 04/30/2022   COVID-19 Vaccine (6 - 2024-25 season) 09/01/2023    Colorectal cancer screening: Type of screening: Colonoscopy. Completed 03/13/2016. Repeat every 10 years  Mammogram status: No longer required due to breast cancer.  Bone Density status: Ordered 2. Pt provided with contact info and advised to call to schedule appt.  Lung Cancer Screening: (Low Dose CT Chest recommended if Age 40-80 years, 20 pack-year currently smoking OR have quit w/in 15years.) does not qualify.   Lung Cancer Screening Referral: N/A  Additional Screening:  Hepatitis C Screening: does qualify; Completed 12/21/2020  Vision Screening: Recommended annual ophthalmology exams for early detection of glaucoma and other disorders of the eye. Is the patient up to date with their annual eye exam?  Yes  Who is the provider or what is the name of the office in which the patient attends annual eye exams? [facility] If pt is not established with a provider, would they like to be referred to a provider to establish care? No .   Dental  Screening: Recommended annual dental exams for proper oral hygiene   Community Resource Referral / Chronic Care Management: CRR required this visit?  No   CCM required this visit?  No     Plan:     I have personally reviewed and noted the following in the patient's chart:   Medical and social history Use of alcohol, tobacco or illicit drugs  Current medications and supplements including opioid prescriptions. Patient is not currently taking opioid prescriptions. Functional ability and status Nutritional status Physical activity Advanced directives List of other physicians Hospitalizations, surgeries, and ER visits in previous 12 months Vitals Screenings to include cognitive, depression, and falls Referrals and appointments  In addition, I have reviewed and discussed with patient certain preventive protocols,  quality metrics, and best practice recommendations. A written personalized care plan for preventive services as well as general preventive health recommendations were provided to patient.     Genette Huertas L Jackson Fetters, CMA   01/30/2024   After Visit Summary: (In Person-Printed) AVS printed and given to the patient  Nurse Notes: Patient is due for DEXA and order has been placed today.  She is up to date on all other health maintenance.  Patient did have a concern that she wanted Dr. Okey Dupre know that she has been having some discomfort in her rt arm.  Patient stated it has been annoying more then very painful.  She had no concerns to address today.

## 2024-01-30 NOTE — Patient Instructions (Addendum)
Wanda Rivera , Thank you for taking time to come for your Medicare Wellness Visit. I appreciate your ongoing commitment to your health goals. Please review the following plan we discussed and let me know if I can assist you in the future.   Referrals/Orders/Follow-Ups/Clinician Recommendations: It was nice to meet you today.  Keep up the good work. You have an order for:   [x]   Bone Density     Please call for appointment:  [facility] [address] [place], [place] [address] [phone]   Make sure to wear two-piece clothing.  No lotions, powders, or deodorants the day of the appointment. Make sure to bring picture ID and insurance card.  Bring list of medications you are currently taking including any supplements.   Schedule your [facility] screening mammogram through MyChart!   Log into your MyChart account.  Go to 'Visit' (or 'Appointments' if on mobile App) --> Schedule an Appointment  Under 'Select a Reason for Visit' choose the Mammogram Screening option.  Complete the pre-visit questions and select the time and place that best fits your schedule.    This is a list of the screening recommended for you and due dates:  Health Maintenance  Topic Date Due   Zoster (Shingles) Vaccine (2 of 2) 04/30/2022   COVID-19 Vaccine (6 - 2024-25 season) 09/01/2023   Medicare Annual Wellness Visit  01/29/2025   DTaP/Tdap/Td vaccine (2 - Td or Tdap) 12/08/2025   Pneumonia Vaccine  Completed   Flu Shot  Completed   DEXA scan (bone density measurement)  Completed   HPV Vaccine  Aged Out   Hepatitis C Screening  Discontinued    Advanced directives: (Copy Requested) Please bring a copy of your health care power of attorney and living will to the office to be added to your chart at your convenience.  Next Medicare Annual Wellness Visit scheduled for next year: Yes

## 2024-02-13 ENCOUNTER — Ambulatory Visit
Admission: RE | Admit: 2024-02-13 | Discharge: 2024-02-13 | Disposition: A | Discharge status: Home / Self Care | Payer: Medicare Other | Source: Ambulatory Visit | Attending: Internal Medicine | Admitting: Internal Medicine

## 2024-02-13 DIAGNOSIS — Z780 Asymptomatic menopausal state: Secondary | ICD-10-CM

## 2024-02-19 ENCOUNTER — Encounter: Payer: Self-pay | Admitting: Internal Medicine

## 2024-02-19 ENCOUNTER — Telehealth: Payer: Self-pay

## 2024-02-19 LAB — HM DEXA SCAN: HM Dexa Scan: -2.2

## 2024-02-19 NOTE — Telephone Encounter (Signed)
Copied from CRM [id number]. Topic: Clinical - Lab/Test Results >> Feb 19, 2024 12:32 PM Corin V wrote: Reason for CRM: Patient asking for call back to go over bone density labs. Please call back to answer question. Please leave a voicemail if she does not answer.

## 2024-02-21 NOTE — Telephone Encounter (Signed)
Patient is calling needing a call back regarding her results

## 2024-02-21 NOTE — Telephone Encounter (Signed)
Called patient and got her scheduled to see provider

## 2024-02-28 ENCOUNTER — Other Ambulatory Visit: Payer: Self-pay | Admitting: Internal Medicine

## 2024-03-03 ENCOUNTER — Ambulatory Visit: Payer: Medicare Other | Admitting: Internal Medicine

## 2024-03-03 VITALS — BP 128/88 | HR 84 | Temp 99.3°F | Ht 64.25 in | Wt 131.0 lb

## 2024-03-03 DIAGNOSIS — M810 Age-related osteoporosis without current pathological fracture: Secondary | ICD-10-CM

## 2024-03-03 NOTE — Patient Instructions (Signed)
 I would recommend you consider reclast or prolia to help the bone strength. Let us know if you want to do this.

## 2024-03-03 NOTE — Progress Notes (Signed)
   Subjective:   Patient ID: Wanda Rivera, female    DOB: December 20, 1941, 83 y.o.   MRN: [id number]  HPI The patient is an 83 YO female coming in for discussion about recent osteoporosis results on dexa with some slight worsening since last results.   Review of Systems  Constitutional: Negative.   HENT: Negative.    Eyes: Negative.   Respiratory:  Negative for cough, chest tightness and shortness of breath.   Cardiovascular:  Negative for chest pain, palpitations and leg swelling.  Gastrointestinal:  Negative for abdominal distention, abdominal pain, constipation, diarrhea, nausea and vomiting.  Musculoskeletal:  Positive for arthralgias.  Skin: Negative.   Neurological: Negative.   Psychiatric/Behavioral: Negative.      Objective:  Physical Exam Constitutional:      Appearance: She is well-developed.  HENT:     Head: Normocephalic and atraumatic.  Cardiovascular:     Rate and Rhythm: Normal rate and regular rhythm.  Pulmonary:     Effort: Pulmonary effort is normal. No respiratory distress.     Breath sounds: Normal breath sounds. No wheezing or rales.  Abdominal:     General: Bowel sounds are normal. There is no distension.     Palpations: Abdomen is soft.     Tenderness: There is no abdominal tenderness. There is no rebound.  Musculoskeletal:        General: Tenderness present.     Cervical back: Normal range of motion.  Skin:    General: Skin is warm and dry.  Neurological:     Mental Status: She is alert and oriented to person, place, and time.     Coordination: Coordination normal.     Vitals:   03/03/24 1500  BP: 128/88  Pulse: 84  Temp: 99.3 F (37.4 C)  TempSrc: Oral  SpO2: 99%  Weight: 131 lb (59.4 kg)  Height: 5' 4.25" (1.632 m)   Assessment & Plan:  Visit time 25 minutes in face to face communication with patient and coordination of care, additional 5 minutes spent in record review, coordination or care, ordering tests, communicating/referring to  other healthcare professionals, documenting in medical records all on the same day of the visit for total time 30 minutes spent on the visit.

## 2024-03-03 NOTE — Assessment & Plan Note (Signed)
 Major fracture 10 year 23% and hip 9.8% per FRAX readings. Offered treatment she had severe GERD with prior oral bisphosphonates in the past and does not want to retry. Offered reclast and prolia.

## 2024-03-06 ENCOUNTER — Encounter: Payer: Self-pay | Admitting: Internal Medicine

## 2024-04-01 ENCOUNTER — Other Ambulatory Visit: Payer: Self-pay | Admitting: Gastroenterology

## 2024-04-06 DIAGNOSIS — L918 Other hypertrophic disorders of the skin: Secondary | ICD-10-CM | POA: Diagnosis not present

## 2024-04-15 DIAGNOSIS — H401123 Primary open-angle glaucoma, left eye, severe stage: Secondary | ICD-10-CM | POA: Diagnosis not present

## 2024-05-04 ENCOUNTER — Telehealth: Payer: Self-pay | Admitting: Adult Health

## 2024-05-04 NOTE — Telephone Encounter (Signed)
 Wanda Rivera rescheduled her appointments and is acceptable to all appointment details.

## 2024-05-06 ENCOUNTER — Other Ambulatory Visit: Payer: Self-pay

## 2024-05-06 ENCOUNTER — Ambulatory Visit: Admitting: Family Medicine

## 2024-05-06 VITALS — BP 122/80 | Ht 64.25 in | Wt 132.0 lb

## 2024-05-06 DIAGNOSIS — M79644 Pain in right finger(s): Secondary | ICD-10-CM | POA: Diagnosis not present

## 2024-05-06 DIAGNOSIS — G8929 Other chronic pain: Secondary | ICD-10-CM

## 2024-05-06 DIAGNOSIS — M79645 Pain in left finger(s): Secondary | ICD-10-CM | POA: Diagnosis not present

## 2024-05-06 DIAGNOSIS — M25572 Pain in left ankle and joints of left foot: Secondary | ICD-10-CM | POA: Diagnosis not present

## 2024-05-06 DIAGNOSIS — M5412 Radiculopathy, cervical region: Secondary | ICD-10-CM

## 2024-05-06 NOTE — Patient Instructions (Addendum)
 Thank you for coming in today.   I've referred you to Hand Therapy.  Let us  know if you don't hear from them in one week.   Please work on the home exercises the athletic trainer went over with you:  View at [email or web address] code [email or web address]  Check back in 1 month

## 2024-05-06 NOTE — Progress Notes (Signed)
   Joanna Muck, PhD, LAT, ATC acting as a scribe for Garlan Juniper, MD.  Wanda Rivera is a 83 y.o. female who presents to [facility] today for L hand and R thumb pain. Pt's last visit for her R thumb was on 01/30/23 and she was given a R 1st MCP steroid injection  Today, pt c/o R thumb pain returning about 3-wk ago. Thumb is not triggering like it was previously  Pt also c/o L thumb pain started a month or more ago. Pain is located along the 1st MCP joints bilaterally.   Additionally, she c/o mid-back pain, slightly above the bra-strap line. She is worried in general w/ her multiple pains because she is a breast cancer survivor.   Dx testing: 02/13/24 DEXA scan  Pertinent review of systems: No fevers or chills  Relevant historical information: Rotator cuff right shoulder.  History of L3 compression fracture. History of breast cancer.  Exam:  BP 122/80   Ht 5' 4.25" (1.632 m)   Wt 132 lb (59.9 kg)   BMI 22.48 kg/m  General: Well Developed, well nourished, and in no acute distress.   MSK: Hands bilaterally significant degenerative changes across bilateral thumb CMC and MCP and IP joints and across PIPs and fingers bilaterally. Thank you normal motion.  Intact strength.  C-spine normal. Nontender palpation spinal midline. Decree cervical motion. Upper extremity strength and reflexes are intact.  Left ankle: Normal appearing Normal ankle motion. Mildly send palpation lateral ankle.  Some pain with resisted ankle eversion.      Assessment and Plan: 83 y.o. 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  Previously she has had trigger finger but I do not see any evidence of triggering today.  Pain is mostly due to degenerative changes especially at the first MCP of both hands.  She is a good candidate for hand therapy.  Plan to refer to OT.  Additionally she is having some left cervical radiculopathy.  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  She does not think her symptoms are bad enough right now to proceed to an epidural steroid injection but will consider it.  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  Plan for home exercise program.  Recheck in about 1 month.   PDMP not reviewed this encounter. Orders Placed This Encounter  Procedures   Ambulatory referral to Occupational Therapy    Referral Priority:   Routine    Referral Type:   Occupational Therapy    Referral Reason:   Specialty Services Required    Requested Specialty:   Occupational Therapy    Number of Visits Requested:   1   No orders of the defined types were placed in this encounter.    Discussed warning signs or symptoms. Please see discharge instructions. Patient expresses understanding.   The above documentation has been reviewed and is accurate and complete Garlan Juniper, M.D.

## 2024-05-14 NOTE — Therapy (Signed)
 OUTPATIENT OCCUPATIONAL THERAPY ORTHO EVALUATION  Patient Name: Wanda Rivera MRN: [id number] DOB:04/21/1941, 83 y.o., female Today's Date: 05/18/2024  PCP: Rickard Charles MD REFERRING PROVIDER:  Syliva Even, MD    END OF SESSION:  OT End of Session - 05/18/24 1153     Visit Number 1    Number of Visits 5    Date for OT Re-Evaluation 06/19/24    Authorization Type Medicare    OT Start Time 1153    OT Stop Time 1237    OT Time Calculation (min) 44 min    Activity Tolerance Patient tolerated treatment well;No increased pain;Patient limited by pain    Behavior During Therapy [facility] for tasks assessed/performed             Past Medical History:  Diagnosis Date   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,hands   Breast cancer (HCC)    1982, recurrence in 1985   Cataract    Bil   Glaucoma    Bil   Osteoporosis    Past Surgical History:  Procedure Laterality Date   ABDOMINAL HYSTERECTOMY     BREAST SURGERY     double masectomy for breast cancer   eye surgery Bilateral 06/05/2017   for Glaucoma   Patient Active Problem List   Diagnosis Date Noted   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 07/11/2023   Pain in joint of right shoulder 03/13/2023   Neck swelling 01/01/2023   High vitamin D  level 01/01/2023   Chronic neck pain 01/01/2023   Numbness of fingers of both hands 12/29/2021   Right sided abdominal pain 02/23/2020   Dysphagia 08/19/2019   Primary osteoarthritis of right knee 12/26/2018   Cough 12/19/2018   Constipation 12/19/2018   Rotator cuff arthropathy of right shoulder 04/02/2018   GERD (gastroesophageal reflux disease) 12/13/2017   History of breast cancer 01/02/2017   Compression fracture of L3 vertebra (HCC) 05/04/2016   Hyperlipidemia 08/16/2015   Left carotid bruit 08/16/2015   Atherosclerotic peripheral vascular disease (HCC) 08/06/2015   Routine general medical examination at a health care facility 12/09/2014   Osteoporosis 12/09/2014   Glaucoma 12/09/2014     ONSET DATE: chronic bil thumb pain (>3-5 months)   REFERRING DIAG: M79.644,M79.645 (ICD-10-CM) - Bilateral thumb pain   THERAPY DIAG:  Pain in joint of right hand  Pain in joint of left hand  Rationale for Evaluation and Treatment: Rehabilitation  SUBJECTIVE:   SUBJECTIVE STATEMENT: She states bil hand /thumb pain L> R, also some Lt upper trap tightness/ pain and MRI showing some pinching nerves.   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    Retired from [employer].  Hx Rt RTC tear, also some pain in bil shoulders.      PERTINENT HISTORY: Per MD note: "L hand and R thumb pain. Pt's last visit for her R thumb was on 01/30/23 and she was given a R 1st MCP steroid injection   Today, pt c/o R thumb pain returning about 3-wk ago. Thumb is not triggering like it was previously   Pt also c/o L thumb pain started a month or more ago. Pain is located along the 1st MCP joints bilaterally.    Additionally, she c/o mid-back pain, slightly above the bra-strap line. She is worried in general w/ her multiple pains because she is a breast cancer survivor. "   PRECAUTIONS: None  RED FLAGS: None   WEIGHT BEARING RESTRICTIONS: No  PAIN:  Are you having pain? Yes: NPRS scale:  2/10 at rest now L > R thumbs Pain location: Bilateral thumbs mostly in MCP joints Pain description: Aching Aggravating factors: Repetitive gripping and squeezing Relieving factors: Heat  FALLS: Has patient fallen in last 6 months? No   PLOF: Independent  PATIENT GOALS: Improve ability and achiness of both hands  NEXT MD VISIT: As needed    OBJECTIVE: (All objective assessments below are from initial evaluation on: 05/18/24 unless otherwise specified.)   HAND DOMINANCE: Right   ADLs: Overall ADLs: States decreased ability to grab, hold household objects, pain and difficulty to open containers   FUNCTIONAL OUTCOME MEASURES: Eval: Quick DASH 14% impairment today  (Higher % Score  =  More Impairment)       UPPER EXTREMITY ROM     Shoulder to Wrist AROM Right eval Left eval  Shoulder flexion    Shoulder abduction    Shoulder extension    Shoulder internal rotation    Shoulder external rotation    Elbow flexion 80 76  Elbow extension 65 66  Forearm supination    Forearm pronation     Wrist flexion    Wrist extension    Wrist ulnar deviation    Wrist radial deviation    Functional dart thrower's motion (F-DTM) in ulnar flexion    F-DTM in radial extension     (Blank rows = not tested)   Hand AROM Right eval Left eval  Full Fist Ability (or Gap to Distal Palmar Crease) full full  Thumb Opposition  (Kapandji Scale)  10/10 9/10  Thumb MCP (0-60) 71 65  Thumb IP (0-80) 65 67  Thumb Radial Abduction Span 35 38   Thumb Palmar Abduction Span     Index MCP (0-90)     Index PIP (0-100)     Index DIP (0-70)      Long MCP (0-90)      Long PIP (0-100)      Long DIP (0-70)      Ring MCP (0-90)      Ring PIP (0-100)      Ring DIP (0-70)      Little MCP (0-90)      Little PIP (0-100)      Little DIP (0-70)      (Blank rows = not tested)   HAND FUNCTION: Eval: Observed weakness in affected bil hand.  Grip strength Right: tender 16 lbs, Left: tender 14 lbs   COORDINATION: Eval: No significant coordination impairment noted  SENSATION: Eval:  intermittent numbness in both hands, but none now, will follow in therapy as needed  EDEMA:   Eval:  Mildly swollen in bil thumb MCP Js   COGNITION: Eval: Overall cognitive status: WFL for evaluation today   OBSERVATIONS:   Eval: Some overt arthritic joints that are tender in both hands, around the thumb, some tightness in wrist extension bilaterally.  Some possible nerve impingement.  Complains of tight upper trap as well as lateral deltoid areas which we can address as well in therapy.  Bil thumb OA and tight upper trap/lateral shoulders    TODAY'S TREATMENT:  Post-evaluation treatment:   For safety/self-care she was  given management of arthritis information today that is listed under patient instructions.  This was briefly reviewed with her and she was recommended to get a pair of arthritis gloves or compression gloves to do with various activities that bother her like mopping.    She was also given the following home exercise program to perform 2 or 3 times  a day gently and without pain.  Unfortunately due to lengthy evaluation discussion, we only had time to educate on the bolded exercises below.  All the exercises will be carefully reviewed with her next week.  Exercises - Seated Cervical Rotation with Nod  - 3-4 x daily - 3-4 reps - 15-20 sec hold - Seated Wrist Flexion Stretch  - 3 x daily - 3 reps - 15 hold - Wrist Prayer Stretch  - 3 x daily - 3 reps - 15 sec hold - Stretch thumb toward base of small finger (put hand in LAP)  - 2-3 x daily - 3-5 reps - 15 sec hold - Thumb Webspace Stretch  - 3 x daily - 3 reps - 15 sec hold - Towel Roll Grip with Forearm in Neutral  - 3 x daily - 5 reps - 10 sec hold   PATIENT EDUCATION: Education details: See tx section above for details  Person educated: Patient Education method: Verbal Instruction, Teach back, Handouts  Education comprehension: States and demonstrates understanding, Additional Education required    HOME EXERCISE PROGRAM: Access Code: [email or web address] URL: [email or web address] Date: 05/18/2024 Prepared by: Leartis Proud     GOALS: Goals reviewed with patient? Yes   SHORT TERM GOALS: (STG required if POC>30 days) Target Date: 05/29/2024  Pt will obtain protective, custom orthotic. Goal status: TBD/PRN  2.  Pt will demo/state understanding of initial HEP to improve pain levels and prerequisite motion. Goal status: INITIAL   LONG TERM GOALS: Target Date: 06/19/2024  Pt will improve functional ability by decreased impairment per Quick DASH assessment from 14% to 8% or better, for better quality of life. Goal status:  INITIAL  2.  Pt will improve grip strength in bilateral hands from by at least 5 pounds each without pain for functional use at home and in IADLs. Goal status: INITIAL  3.  Pt will improve A/ROM in bilateral wrist extension from approximately 65 degrees to at least 70 degrees, to have functional motion for tasks like reach and grasp.  Goal status: INITIAL  4.  Pt will decrease pain at rest from "aching" 2/10 to "none significant" to have better sleep and occupational participation in daily roles. Goal status: INITIAL  ASSESSMENT:  CLINICAL IMPRESSION: Patient is a 83 y.o. female who was seen today for occupational therapy evaluation for bilateral thumb arthritis and pain with gripping and squeezing activities as well as some upper trap and lateral shoulder tightness and pain from time to time.  She will benefit from outpatient occupational therapy to decrease symptoms and increase quality of life.   PERFORMANCE DEFICITS: in functional skills including IADLs, dexterity, ROM, strength, pain, body mechanics, and UE functional use, and psychosocial skills including coping strategies, environmental adaptation, and habits.   IMPAIRMENTS: are limiting patient from IADLs and leisure.   COMORBIDITIES: may have co-morbidities  that affects occupational performance. Patient will benefit from skilled OT to address above impairments and improve overall function.  MODIFICATION OR ASSISTANCE TO COMPLETE EVALUATION: No modification of tasks or assist necessary to complete an evaluation.  OT OCCUPATIONAL PROFILE AND HISTORY: Problem focused assessment: Including review of records relating to presenting problem.  CLINICAL DECISION MAKING: Moderate - several treatment options, min-mod task modification necessary  REHAB POTENTIAL: Excellent  EVALUATION COMPLEXITY: Low      PLAN:  OT FREQUENCY: 1x/week  OT DURATION: 4 weeks through 06/19/24 and up to 5 total visits as needed.   PLANNED  INTERVENTIONS: 97535 self care/ADL training, 16109 therapeutic exercise, [id number]  therapeutic activity, 97112 neuromuscular re-education, 97140 manual therapy, 97760 Orthotic Initial, 69629 Orthotic/Prosthetic subsequent, compression bandaging, Dry needling, energy conservation, coping strategies training, patient/family education, and DME and/or AE instructions  RECOMMENDED OTHER SERVICES: none now   CONSULTED AND AGREED WITH PLAN OF CARE: Patient  PLAN FOR NEXT SESSION:  Review initial recommendations and carefully go over her entire home exercise program.   Leartis Proud, OTR/L, CHT 05/18/2024, 12:56 PM

## 2024-05-18 ENCOUNTER — Ambulatory Visit: Admitting: Rehabilitative and Restorative Service Providers"

## 2024-05-18 ENCOUNTER — Encounter: Payer: Self-pay | Admitting: Rehabilitative and Restorative Service Providers"

## 2024-05-18 DIAGNOSIS — M25542 Pain in joints of left hand: Secondary | ICD-10-CM

## 2024-05-18 DIAGNOSIS — M25541 Pain in joints of right hand: Secondary | ICD-10-CM

## 2024-05-18 NOTE — Patient Instructions (Signed)

## 2024-05-28 NOTE — Therapy (Signed)
 OUTPATIENT OCCUPATIONAL THERAPY TREATMENT NOTE  Patient Name: Wanda Rivera MRN: [id number] DOB:[birth year], 83 y.o., female Today's Date: 05/29/2024  PCP: Rickard Charles MD REFERRING PROVIDER:  Syliva Even, MD    END OF SESSION:  OT End of Session - 05/29/24 1024     Visit Number 2    Number of Visits 5    Date for OT Re-Evaluation 06/19/24    Authorization Type Medicare    OT Start Time 1024    OT Stop Time 1059    OT Time Calculation (min) 35 min    Activity Tolerance Patient tolerated treatment well;No increased pain;Patient limited by pain    Behavior During Therapy [facility] for tasks assessed/performed              Past Medical History:  Diagnosis Date   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,hands   Breast cancer (HCC)    1982, recurrence in 1985   Cataract    Bil   Glaucoma    Bil   Osteoporosis    Past Surgical History:  Procedure Laterality Date   ABDOMINAL HYSTERECTOMY     BREAST SURGERY     double masectomy for breast cancer   eye surgery Bilateral 06/05/2017   for Glaucoma   Patient Active Problem List   Diagnosis Date Noted   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 07/11/2023   Pain in joint of right shoulder 03/13/2023   Neck swelling 01/01/2023   High vitamin D  level 01/01/2023   Chronic neck pain 01/01/2023   Numbness of fingers of both hands 12/29/2021   Right sided abdominal pain 02/23/2020   Dysphagia 08/19/2019   Primary osteoarthritis of right knee 12/26/2018   Cough 12/19/2018   Constipation 12/19/2018   Rotator cuff arthropathy of right shoulder 04/02/2018   GERD (gastroesophageal reflux disease) 12/13/2017   History of breast cancer 01/02/2017   Compression fracture of L3 vertebra (HCC) 05/04/2016   Hyperlipidemia 08/16/2015   Left carotid bruit 08/16/2015   Atherosclerotic peripheral vascular disease (HCC) 08/06/2015   Routine general medical examination at a health care facility 12/09/2014   Osteoporosis 12/09/2014   Glaucoma 12/09/2014     ONSET DATE: chronic bil thumb pain (>3-5 months)   REFERRING DIAG: M79.644,M79.645 (ICD-10-CM) - Bilateral thumb pain   THERAPY DIAG:  Pain in joint of right hand  Pain in joint of left hand  Chronic right shoulder pain  Cervicalgia  Muscle weakness (generalized)  Rationale for Evaluation and Treatment: Rehabilitation  PERTINENT HISTORY: Per MD note: "L hand and R thumb pain. Pt's last visit for her R thumb was on 01/30/23 and she was given a R 1st MCP steroid injection   Today, pt c/o R thumb pain returning about 3-wk ago. Thumb is not triggering like it was previously   Pt also c/o L thumb pain started a month or more ago. Pain is located along the 1st MCP joints bilaterally.    Additionally, she c/o mid-back pain, slightly above the bra-strap line. She is worried in general w/ her multiple pains because she is a breast cancer survivor. "  She states bil hand /thumb pain L> R, also some Lt upper trap tightness/ pain and MRI showing some pinching nerves.   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    Retired from [employer].  Hx Rt RTC tear, also some pain in bil shoulders.   PRECAUTIONS: None  RED FLAGS: None   WEIGHT BEARING RESTRICTIONS: No   SUBJECTIVE:   SUBJECTIVE STATEMENT:  She states having a mid trap muscle spasm / back pain hard time sleeping recently.      PAIN:  Are you having pain? Yes: NPRS scale: 1-2/10 at rest now L > R thumbs, upper back is higher 4-5/10 now  Pain location: Bilateral thumbs mostly in MCP joints Pain description: Aching Aggravating factors: Repetitive gripping and squeezing Relieving factors: Heat  FALLS: Has patient fallen in last 6 months? No   PLOF: Independent  PATIENT GOALS: Improve ability and achiness of both hands  NEXT MD VISIT: As needed    OBJECTIVE: (All objective assessments below are from initial evaluation on: 05/18/24 unless otherwise specified.)   HAND DOMINANCE: Right   ADLs: Overall ADLs: States decreased  ability to grab, hold household objects, pain and difficulty to open containers   FUNCTIONAL OUTCOME MEASURES: Eval: Quick DASH 14% impairment today  (Higher % Score  =  More Impairment)      UPPER EXTREMITY ROM     Shoulder to Wrist AROM Right eval Left eval  Shoulder flexion    Shoulder abduction    Shoulder extension    Shoulder internal rotation    Shoulder external rotation    Elbow flexion 80 76  Elbow extension 65 66  Forearm supination    Forearm pronation     Wrist flexion    Wrist extension    Wrist ulnar deviation    Wrist radial deviation    Functional dart thrower's motion (F-DTM) in ulnar flexion    F-DTM in radial extension     (Blank rows = not tested)   Hand AROM Right eval Left eval  Full Fist Ability (or Gap to Distal Palmar Crease) full full  Thumb Opposition  (Kapandji Scale)  10/10 9/10  Thumb MCP (0-60) 71 65  Thumb IP (0-80) 65 67  Thumb Radial Abduction Span 35 38   Thumb Palmar Abduction Span     Index MCP (0-90)     Index PIP (0-100)     Index DIP (0-70)      Long MCP (0-90)      Long PIP (0-100)      Long DIP (0-70)      Ring MCP (0-90)      Ring PIP (0-100)      Ring DIP (0-70)      Little MCP (0-90)      Little PIP (0-100)      Little DIP (0-70)      (Blank rows = not tested)   HAND FUNCTION: Eval: Observed weakness in affected bil hand.  Grip strength Right: tender 16 lbs, Left: tender 14 lbs   SENSATION: Eval:  intermittent numbness in both hands, but none now, will follow in therapy as needed  EDEMA:   Eval:  Mildly swollen in bil thumb MCP Js   OBSERVATIONS:   Eval: Some overt arthritic joints that are tender in both hands, around the thumb, some tightness in wrist extension bilaterally.  Some possible nerve impingement.  Complains of tight upper trap as well as lateral deltoid areas which we can address as well in therapy.  Bil thumb OA and tight upper trap/lateral shoulders    TODAY'S TREATMENT:  05/29/24: To  help with new upper back spasm,  OT does manual therapy percussion, trigger point release, and edu on bolded stretches /strength below.  After performance, she states her pain is "almost gone."    Next, we review her HEP for thumb OA/pain, and each was done with her carefully.  She was also reminded to avoid pain, repetition, prolonged postures, etc., for self-care/safety.   She leaves feeling better with no significant pain, states understanding.    Exercises - Seated Cervical Rotation with Nod  - 3-4 x daily - 3-4 reps - 15-20 sec hold - Seated Wrist Flexion Stretch  - 3 x daily - 3 reps - 15 hold - Wrist Prayer Stretch  - 3 x daily - 3 reps - 15 sec hold - Stretch thumb toward base of small finger (put hand in LAP)  - 2-3 x daily - 3-5 reps - 15 sec hold - Thumb Webspace Stretch  - 3 x daily - 3 reps - 15 sec hold - Towel Roll Grip with Forearm in Neutral  - 3 x daily - 5 reps - 10 sec hold - Standing Shoulder Posterior Capsule Stretch  - 2-3 x daily - 3-5 reps - 15 hold - Standing Shoulder Row with Anchored Resistance  - 2-4 x daily - 1-2 sets - 10-15 reps   PATIENT EDUCATION: Education details: See tx section above for details  Person educated: Patient Education method: Verbal Instruction, Teach back, Handouts  Education comprehension: States and demonstrates understanding, Additional Education required    HOME EXERCISE PROGRAM: Access Code: [email or web address] URL: [email or web address] Date: 05/18/2024 Prepared by: Leartis Proud     GOALS: Goals reviewed with patient? Yes   SHORT TERM GOALS: (STG required if POC>30 days) Target Date: 05/29/2024  Pt will obtain protective, custom orthotic. Goal status: TBD/PRN  2.  Pt will demo/state understanding of initial HEP to improve pain levels and prerequisite motion. Goal status: 05/29/24: MET   LONG TERM GOALS: Target Date: 06/19/2024  Pt will improve functional ability by decreased impairment per Quick DASH  assessment from 14% to 8% or better, for better quality of life. Goal status: INITIAL  2.  Pt will improve grip strength in bilateral hands from by at least 5 pounds each without pain for functional use at home and in IADLs. Goal status: INITIAL  3.  Pt will improve A/ROM in bilateral wrist extension from approximately 65 degrees to at least 70 degrees, to have functional motion for tasks like reach and grasp.  Goal status: INITIAL  4.  Pt will decrease pain at rest from "aching" 2/10 to "none significant" to have better sleep and occupational participation in daily roles. Goal status: INITIAL  ASSESSMENT:  CLINICAL IMPRESSION: 05/29/24: Doing much better though needs to master HEP still for long-term management     PLAN:  OT FREQUENCY: 1x/week  OT DURATION: 4 weeks through 06/19/24 and up to 5 total visits as needed.   PLANNED INTERVENTIONS: 97535 self care/ADL training, 82956 therapeutic exercise, 97530 therapeutic activity, 97112 neuromuscular re-education, 97140 manual therapy, 97760 Orthotic Initial, [phone] Orthotic/Prosthetic subsequent, compression bandaging, Dry needling, energy conservation, coping strategies training, patient/family education, and DME and/or AE instructions  RECOMMENDED OTHER SERVICES: none now   CONSULTED AND AGREED WITH PLAN OF CARE: Patient  PLAN FOR NEXT SESSION:  Check full HEP, muscle spasm, etc.    Leartis Proud, OTR/L, CHT 05/29/2024, 12:38 PM

## 2024-05-29 ENCOUNTER — Ambulatory Visit (INDEPENDENT_AMBULATORY_CARE_PROVIDER_SITE_OTHER): Admitting: Rehabilitative and Restorative Service Providers"

## 2024-05-29 ENCOUNTER — Encounter: Payer: Self-pay | Admitting: Rehabilitative and Restorative Service Providers"

## 2024-05-29 DIAGNOSIS — M25511 Pain in right shoulder: Secondary | ICD-10-CM

## 2024-05-29 DIAGNOSIS — G8929 Other chronic pain: Secondary | ICD-10-CM

## 2024-05-29 DIAGNOSIS — M6281 Muscle weakness (generalized): Secondary | ICD-10-CM

## 2024-05-29 DIAGNOSIS — M25542 Pain in joints of left hand: Secondary | ICD-10-CM

## 2024-05-29 DIAGNOSIS — M25541 Pain in joints of right hand: Secondary | ICD-10-CM

## 2024-05-29 DIAGNOSIS — M542 Cervicalgia: Secondary | ICD-10-CM

## 2024-06-02 ENCOUNTER — Ambulatory Visit (INDEPENDENT_AMBULATORY_CARE_PROVIDER_SITE_OTHER): Admitting: Gastroenterology

## 2024-06-02 ENCOUNTER — Encounter: Payer: Self-pay | Admitting: Gastroenterology

## 2024-06-02 VITALS — BP 112/64 | HR 70 | Ht 64.0 in | Wt 131.0 lb

## 2024-06-02 DIAGNOSIS — K5900 Constipation, unspecified: Secondary | ICD-10-CM | POA: Diagnosis not present

## 2024-06-02 DIAGNOSIS — K219 Gastro-esophageal reflux disease without esophagitis: Secondary | ICD-10-CM

## 2024-06-02 DIAGNOSIS — R109 Unspecified abdominal pain: Secondary | ICD-10-CM

## 2024-06-02 DIAGNOSIS — R1031 Right lower quadrant pain: Secondary | ICD-10-CM

## 2024-06-02 DIAGNOSIS — K5904 Chronic idiopathic constipation: Secondary | ICD-10-CM

## 2024-06-02 MED ORDER — LINACLOTIDE 145 MCG PO CAPS: 145.0000 ug | ORAL_CAPSULE | Freq: Every day | ORAL | Status: DC

## 2024-06-02 MED ORDER — DICYCLOMINE HCL 10 MG PO CAPS: ORAL_CAPSULE | ORAL | 3 refills | Status: DC

## 2024-06-02 NOTE — Progress Notes (Signed)
 Wanda Rivera [id number] 05-16-1941   Chief Complaint: Constipation  Referring Provider: Adelia Homestead, * Primary GI MD: Dr. Leonia Raman  HPI: Wanda Rivera is a 83 y.o. female with past medical history of breast cancer in 1982, with recurrence in 1985, chronic right sided abdominal pain, chronic constipation who presents today for complaint of constipation.  Patient last seen in the office 08/23/2023 by Suzanna Erp, PA-C for evaluation of chronic constipation and intermittent RLQ pain ongoing since 2021.  She has previously had extensive negative workup including ultrasound, CT scans, imaging of her back, HIDA scan without definitive cause of pain.  Thought to be possibly musculoskeletal.  Improves with Tylenol .  She did well on Linzess  samples, but her insurance did not cover the medication and it was too expensive for her.  She had been taking MiraLAX  on a regular basis with some improvement.  Plan at last visit was to continue MiraLAX , increase water intake, trial IBgard for pain.  Patient states she has continued to take MiraLAX  as well as prune juice for constipation, but it does not always work.  Sometimes she backs off of this regimen for a day or so if stools get too loose.  She typically has a bowel movement daily, sometimes misses a day. Denies blood in stool or melena.  She talked with her insurance company last month regarding Linzess , and found out that she can get a 90-day supply for $125, which is better than the last time she called and they told her it would be $400.  She had good success with Linzess  when she took samples of 145 mcg, and wants to try this again if she can make that price work.  She has been taking dicyclomine  10 mg twice daily which does help with her RLQ abdominal pain/spasms.  Her acid reflux is largely controlled on omeprazole  twice daily.  Occasionally she may have some breakthrough symptoms if she eats a trigger food, particularly spicy  foods.  May have occasional nausea with this as well but denies vomiting.  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 No further workup is required.  HIDA scan 11/25/2020 Normal study with gallbladder ejection fraction of 82%  CT A/P 03/11/2020 - No evidence of bowel obstruction.  Normal appendix. - Mild to moderate colonic stool burden, suggesting mild constipation.  EGD 02/20/2017 - Normal esophagus.  - Gastritis. Biopsied and negative for H. pylori - Duodenal erosions without bleeding.  Colonoscopy 03/13/2016 - Non- bleeding internal hemorrhoids.  - The examination was otherwise normal.  - No specimens collected. 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      brimonidine (ALPHAGAN) 0.2 % ophthalmic solution Place 1 drop into the right eye in the morning and at bedtime.     Calcium  Carbonate (CALCIUM  600 PO) Take 1  tablet by mouth in the morning and at bedtime.     Cyanocobalamin  (VITAMIN B 12 PO) Take 500 mg by mouth daily.     diclofenac  Sodium (VOLTAREN ) 1 % GEL Apply 4 g topically 4 (four) times daily. 100 g 0   dicyclomine  (BENTYL ) 10 MG capsule TAKE 1 CAPSULE TWICE DAILY AS NEEDED FOR SPASMS 180 capsule 3   dorzolamide-timolol (COSOPT) 22.3-6.8 MG/ML ophthalmic solution Place 1 drop into both eyes 2 (two) times daily.     DULoxetine  (CYMBALTA ) 20 MG capsule TAKE 1 CAPSULE EVERY DAY 90 capsule 3    latanoprost (XALATAN) 0.005 % ophthalmic solution Place 1 drop into both eyes at bedtime.     Latanoprostene Bunod (VYZULTA OP) Place 1 drop into the right eye daily.     lidocaine  (LIDODERM ) 5 % Place 1 patch onto the skin daily. Remove & Discard patch within 12 hours or as directed by MD (Patient not taking: Reported on 03/03/2024) 30 patch 0   meclizine  (ANTIVERT ) 12.5 MG tablet Take 12.5 mg by mouth 3 (three) times daily as needed for dizziness.      Multiple Vitamin (ONE-A-DAY ESSENTIAL PO) Take by mouth daily.     Omega-3 1000 MG CAPS Take 1 capsule by mouth daily.  (Patient not taking: Reported on 01/30/2024)     Omega-3 Fatty Acids (FISH OIL) 500 MG CAPS Take by mouth.     omeprazole  (PRILOSEC) 40 MG capsule TAKE 1 CAPSULE TWICE DAILY 180 capsule 3   polyethylene glycol (MIRALAX  / GLYCOLAX ) 17 g packet Take 17 g by mouth as needed.     rosuvastatin  (CRESTOR ) 10 MG tablet TAKE 1 TABLET EVERY DAY 90 tablet 1   Turmeric 500 MG TABS Take 1 tablet by mouth daily.      No current facility-administered medications for this visit.    Allergies as of 06/02/2024 - Review Complete 05/29/2024  Allergen Reaction Noted   Darvon [propoxyphene]  02/28/2016   Morphine and codeine  02/28/2016   Percocet [oxycodone-acetaminophen ]  02/28/2016   Vicodin [hydrocodone-acetaminophen ]  02/28/2016    Family History  Problem Relation Age of Onset   Cancer Mother    Breast cancer Mother    Liver cancer Mother    Diabetes Father    Hypertension Father    Hypertension Sister    Diabetes Brother    Heart disease Brother    Breast cancer Niece     Social History   Tobacco Use   Smoking status: Never   Smokeless tobacco: Never  Vaping Use   Vaping status: Never Used  Substance Use Topics   Alcohol use: Yes    Alcohol/week: 0.0 standard drinks of alcohol    Comment: occasionally a glass of red wine   Drug use: No     Review of Systems:    Constitutional: No weight loss, fever, chills, weakness  or fatigue Skin: No rash or itching Cardiovascular: No chest pain, chest pressure or palpitations   Respiratory: No SOB or cough Gastrointestinal: See HPI and otherwise negative Neurological: No headache, dizziness or syncope Musculoskeletal: No new muscle or joint pain Hematologic: No bleeding or bruising    Physical Exam:  Vital signs: BP 112/64   Pulse 70   Ht 5\' 4"  (1.626 m)   Wt 131 lb (59.4 kg)   BMI 22.49 kg/m    Constitutional: NAD, Well developed, Well nourished, alert and cooperative Head:  Normocephalic and atraumatic.  Eyes: No scleral icterus. Conjunctiva pink. Mouth: No oral lesions. Respiratory:  Respirations even and unlabored. Lungs clear to auscultation bilaterally.  No wheezes, crackles, or rhonchi.  Cardiovascular:  Regular rate and rhythm. No murmurs. No peripheral edema. Gastrointestinal:  Soft, nondistended, nontender. No rebound or guarding. Normal bowel sounds. No appreciable masses or hepatomegaly. Rectal:  Not performed.  Neurologic:  Alert and oriented x4;  grossly normal neurologically.  Skin:   Dry and intact without significant lesions or rashes. Psychiatric: Oriented to person, place and time. Demonstrates good judgement and reason without abnormal affect or behaviors.   RELEVANT LABS AND IMAGING: CBC    Component Value Date/Time   WBC 5.5 01/07/2024 1017   RBC 4.48 01/07/2024 1017   HGB 14.2 01/07/2024 1017   HCT 43.0 01/07/2024 1017   PLT 182.0 01/07/2024 1017   MCV 96.1 01/07/2024 1017   MCV 92.6 01/10/2017 1059   MCH 31.4 05/03/2019 1405   MCHC 33.0 01/07/2024 1017   RDW 13.9 01/07/2024 1017    CMP     Component Value Date/Time   NA 142 01/07/2024 1017   NA 142 01/02/2017 1235   K 4.8 01/07/2024 1017   CL 105 01/07/2024 1017   CO2 32 01/07/2024 1017   GLUCOSE 89 01/07/2024 1017   BUN 11 01/07/2024 1017   BUN 15 01/02/2017 1235   CREATININE 0.95 01/07/2024 1017   CREATININE 1.31 (H) 02/22/2015 1154   CALCIUM  9.6 01/07/2024  1017   PROT 7.0 01/07/2024 1017   PROT 6.5 01/02/2017 1235   ALBUMIN 4.4 01/07/2024 1017   ALBUMIN 4.4 01/02/2017 1235   AST 26 01/07/2024 1017   ALT 16 01/07/2024 1017   ALKPHOS 96 01/07/2024 1017   BILITOT 0.6 01/07/2024 1017   BILITOT 0.5 01/02/2017 1235   GFRNONAA 48 (L) 05/03/2019 1405   GFRNONAA 50 (L) 08/11/2014 1452   GFRAA 55 (L) 05/03/2019 1405   GFRAA 57 (L) 08/11/2014 1452     Assessment/Plan:   Constipation  Constipation overall managed with MiraLAX  and prune juice, though patient had better results with Linzess .  However, she was not able to afford the medication at the time when she last tried it.  She recently found out that there has been a decrease in the price with her insurance and would like to pursue starting Linzess  again.  - Will give samples of Linzess  145 mcg and patient assistance form for patient to complete to hopefully help lower the cost.  - Can send Rx for Linzess  145mcg daily, 90 day supply  Abdominal pain Symptoms controlled on dicyclomine  BID.   - Continue dicyclomine  10mg , try to decrease dose to once a day or as needed to avoid side effects, potential worsening of constipation. Will send refill.  GERD Symptoms largely controlled, with occasional breakthrough acid reflux and nausea if she eats spicy food.  - Continue omeprazole  40mg  BID   Valiant Gaul, PA-C [facility] 06/02/2024, 8:13 AM  Patient Care Team: Adelia Homestead, MD as PCP - General (Internal Medicine) Knox Perl, MD as Consulting Physician (Cardiology) [facility] as Consulting Physician (Ophthalmology) Sergio Dandy, MD as Consulting Physician (Gastroenterology) Syliva Even, MD as Consulting Physician (Sports Medicine) Greg Leaks, MD as Consulting Physician (Ophthalmology)

## 2024-06-02 NOTE — Patient Instructions (Signed)
 We have sent the following prescriptions to your mail in pharmacy: dicyclomine .   If you have not heard from your mail in pharmacy within 1 week or if you have not received your medication in the mail, please contact us  at [phone] so we may find out why.  We have printed out a Linzess  patient assistance forms for you to fill out and return to our office.   We have also given you samples of Linzess  145 mcg daily to start once daily.   _______________________________________________________  If your blood pressure at your visit was 140/90 or greater, please contact your primary care physician to follow up on this.  _______________________________________________________  If you are age 82 or older, your body mass index should be between 23-30. Your Body mass index is 22.49 kg/m. If this is out of the aforementioned range listed, please consider follow up with your Primary Care Provider.  If you are age 75 or younger, your body mass index should be between 19-25. Your Body mass index is 22.49 kg/m. If this is out of the aformentioned range listed, please consider follow up with your Primary Care Provider.   ________________________________________________________  The [facility] providers would like to encourage you to use MYCHART to communicate with providers for non-urgent requests or questions.  Due to long hold times on the telephone, sending your provider a message by [facility] may be a faster and more efficient way to get a response.  Please allow 48 business hours for a response.  Please remember that this is for non-urgent requests.  _______________________________________________________

## 2024-06-02 NOTE — Therapy (Signed)
 OUTPATIENT OCCUPATIONAL THERAPY TREATMENT NOTE  Patient Name: Wanda Rivera MRN: [id number] DOB:1941/12/25, 83 y.o., female Today's Date: 06/03/2024  PCP: Rickard Charles MD REFERRING PROVIDER:  Syliva Even, MD    END OF SESSION:  OT End of Session - 06/03/24 1024     Visit Number 3    Number of Visits 5    Date for OT Re-Evaluation 06/19/24    Authorization Type Medicare    OT Start Time 1024    OT Stop Time 1057    OT Time Calculation (min) 33 min    Activity Tolerance Patient tolerated treatment well;No increased pain;Patient limited by pain    Behavior During Therapy [facility] for tasks assessed/performed               Past Medical History:  Diagnosis Date   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,hands   Breast cancer (HCC)    1982, recurrence in 1985   Cataract    Bil   Glaucoma    Bil   Osteoporosis    Past Surgical History:  Procedure Laterality Date   ABDOMINAL HYSTERECTOMY     BREAST SURGERY     double masectomy for breast cancer   eye surgery Bilateral 06/05/2017   for Glaucoma   Patient Active Problem List   Diagnosis Date Noted   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 07/11/2023   Pain in joint of right shoulder 03/13/2023   Neck swelling 01/01/2023   High vitamin D  level 01/01/2023   Chronic neck pain 01/01/2023   Numbness of fingers of both hands 12/29/2021   Right sided abdominal pain 02/23/2020   Dysphagia 08/19/2019   Primary osteoarthritis of right knee 12/26/2018   Cough 12/19/2018   Constipation 12/19/2018   Rotator cuff arthropathy of right shoulder 04/02/2018   GERD (gastroesophageal reflux disease) 12/13/2017   History of breast cancer 01/02/2017   Compression fracture of L3 vertebra (HCC) 05/04/2016   Hyperlipidemia 08/16/2015   Left carotid bruit 08/16/2015   Atherosclerotic peripheral vascular disease (HCC) 08/06/2015   Routine general medical examination at a health care facility 12/09/2014   Osteoporosis 12/09/2014   Glaucoma 12/09/2014     ONSET DATE: chronic bil thumb pain (>3-5 months)   REFERRING DIAG: M79.644,M79.645 (ICD-10-CM) - Bilateral thumb pain   THERAPY DIAG:  Pain in joint of right hand  Pain in joint of left hand  Chronic right shoulder pain  Muscle weakness (generalized)  Cervicalgia  Rationale for Evaluation and Treatment: Rehabilitation  PERTINENT HISTORY: Per MD note: "L hand and R thumb pain. Pt's last visit for her R thumb was on 01/30/23 and she was given a R 1st MCP steroid injection   Today, pt c/o R thumb pain returning about 3-wk ago. Thumb is not triggering like it was previously   Pt also c/o L thumb pain started a month or more ago. Pain is located along the 1st MCP joints bilaterally.    Additionally, she c/o mid-back pain, slightly above the bra-strap line. She is worried in general w/ her multiple pains because she is a breast cancer survivor. "  She states bil hand /thumb pain L> R, also some Lt upper trap tightness/ pain and MRI showing some pinching nerves.   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    Retired from [employer].  Hx Rt RTC tear, also some pain in bil shoulders.   PRECAUTIONS: None  RED FLAGS: None   WEIGHT BEARING RESTRICTIONS: No   SUBJECTIVE:   SUBJECTIVE  STATEMENT: She states that the knot in her back is gone, just sore, hands periodically hurt, infrequently       PAIN:  Are you having pain? Yes: NPRS scale:  1-2/10 at rest now L > R thumbs, upper back is higher 4-5/10 now  Pain location: Bilateral thumbs mostly in MCP joints Pain description: Aching Aggravating factors: Repetitive gripping and squeezing Relieving factors: Heat  FALLS: Has patient fallen in last 6 months? No   PLOF: Independent  PATIENT GOALS: Improve ability and achiness of both hands  NEXT MD VISIT: As needed    OBJECTIVE: (All objective assessments below are from initial evaluation on: 05/18/24 unless otherwise specified.)   HAND DOMINANCE: Right   ADLs: Overall ADLs:  States decreased ability to grab, hold household objects, pain and difficulty to open containers   FUNCTIONAL OUTCOME MEASURES: Eval: Quick DASH 14% impairment today  (Higher % Score  =  More Impairment)      UPPER EXTREMITY ROM     Shoulder to Wrist AROM Right eval Left eval  Shoulder flexion    Shoulder abduction    Shoulder extension    Shoulder internal rotation    Shoulder external rotation    Elbow flexion 80 76  Elbow extension 65 66  Forearm supination    Forearm pronation     Wrist flexion    Wrist extension    Wrist ulnar deviation    Wrist radial deviation    Functional dart thrower's motion (F-DTM) in ulnar flexion    F-DTM in radial extension     (Blank rows = not tested)   Hand AROM Right eval Left eval  Full Fist Ability (or Gap to Distal Palmar Crease) full full  Thumb Opposition  (Kapandji Scale)  10/10 9/10  Thumb MCP (0-60) 71 65  Thumb IP (0-80) 65 67  Thumb Radial Abduction Span 35 38   Thumb Palmar Abduction Span     Index MCP (0-90)     Index PIP (0-100)     Index DIP (0-70)      Long MCP (0-90)      Long PIP (0-100)      Long DIP (0-70)      Ring MCP (0-90)      Ring PIP (0-100)      Ring DIP (0-70)      Little MCP (0-90)      Little PIP (0-100)      Little DIP (0-70)      (Blank rows = not tested)   HAND FUNCTION: Eval: Observed weakness in affected bil hand.  Grip strength Right: tender 16 lbs, Left: tender 14 lbs   SENSATION: Eval:  intermittent numbness in both hands, but none now, will follow in therapy as needed  EDEMA:   Eval:  Mildly swollen in bil thumb MCP Js   OBSERVATIONS:   Eval: Some overt arthritic joints that are tender in both hands, around the thumb, some tightness in wrist extension bilaterally.  Some possible nerve impingement.  Complains of tight upper trap as well as lateral deltoid areas which we can address as well in therapy.  Bil thumb OA and tight upper trap/lateral shoulders    TODAY'S  TREATMENT:  06/03/24: As she is not having any more significant muscle spasm or pain in her back, we review these exercises quickly, and go back to reviewing arthritis management program.  Again OT reminds her to modify activities, try supportive compression gloves if helpful, but to do the following  program 2-3 times a day after moist heat if possible.  Each exercise was reviewed with her and performed with her for thumb arthritis today, she states these feel good and at the end of the session she states having no significant pain and understanding directions.    Exercises - Seated Cervical Rotation with Nod  - 3-4 x daily - 3-4 reps - 15-20 sec hold - Seated Wrist Flexion Stretch  - 3 x daily - 3 reps - 15 hold - Wrist Prayer Stretch  - 3 x daily - 3 reps - 15 sec hold - Stretch thumb toward base of small finger (put hand in LAP)  - 2-3 x daily - 3-5 reps - 15 sec hold - Thumb Webspace Stretch  - 3 x daily - 3 reps - 15 sec hold - Towel Roll Grip with Forearm in Neutral  - 3 x daily - 5 reps - 10 sec hold - Standing Shoulder Posterior Capsule Stretch  - 2-3 x daily - 3-5 reps - 15 hold - Standing Shoulder Row with Anchored Resistance  - 2-4 x daily - 1-2 sets - 10-15 reps   PATIENT EDUCATION: Education details: See tx section above for details  Person educated: Patient Education method: Verbal Instruction, Teach back, Handouts  Education comprehension: States and demonstrates understanding, Additional Education required    HOME EXERCISE PROGRAM: Access Code: [email or web address] URL: [email or web address] Date: 05/18/2024 Prepared by: Leartis Proud     GOALS: Goals reviewed with patient? Yes   SHORT TERM GOALS: (STG required if POC>30 days) Target Date: 05/29/2024  Pt will obtain protective, custom orthotic. Goal status: TBD/PRN  2.  Pt will demo/state understanding of initial HEP to improve pain levels and prerequisite motion. Goal status: 05/29/24: MET   LONG TERM  GOALS: Target Date: 06/19/2024  Pt will improve functional ability by decreased impairment per Quick DASH assessment from 14% to 8% or better, for better quality of life. Goal status: INITIAL  2.  Pt will improve grip strength in bilateral hands from by at least 5 pounds each without pain for functional use at home and in IADLs. Goal status: INITIAL  3.  Pt will improve A/ROM in bilateral wrist extension from approximately 65 degrees to at least 70 degrees, to have functional motion for tasks like reach and grasp.  Goal status: INITIAL  4.  Pt will decrease pain at rest from "aching" 2/10 to "none significant" to have better sleep and occupational participation in daily roles. Goal status: INITIAL  ASSESSMENT:  CLINICAL IMPRESSION: 06/03/24: She now understands her home exercise programs, but needs to get consistent.  It is excellent that her shoulder pain and tightness is decreasing and has no more spasm.  05/29/24: Doing much better though needs to master HEP still for long-term management     PLAN:  OT FREQUENCY: 1x/week  OT DURATION: 4 weeks through 06/19/24 and up to 5 total visits as needed.   PLANNED INTERVENTIONS: 97535 self care/ADL training, 16109 therapeutic exercise, 97530 therapeutic activity, 97112 neuromuscular re-education, 97140 manual therapy, 97760 Orthotic Initial, [phone] Orthotic/Prosthetic subsequent, compression bandaging, Dry needling, energy conservation, coping strategies training, patient/family education, and DME and/or AE instructions  RECOMMENDED OTHER SERVICES: none now   CONSULTED AND AGREED WITH PLAN OF CARE: Patient  PLAN FOR NEXT SESSION:  Check for next Tuesday, and once she has a good knowledge of her exercises and management and no more significant concerns or questions she can be fit for discharge.   Leartis Proud, OTR/L, CHT  06/03/2024, 11:06 AM

## 2024-06-03 ENCOUNTER — Ambulatory Visit (INDEPENDENT_AMBULATORY_CARE_PROVIDER_SITE_OTHER): Admitting: Rehabilitative and Restorative Service Providers"

## 2024-06-03 ENCOUNTER — Encounter: Payer: Self-pay | Admitting: Rehabilitative and Restorative Service Providers"

## 2024-06-03 DIAGNOSIS — M542 Cervicalgia: Secondary | ICD-10-CM

## 2024-06-03 DIAGNOSIS — M25541 Pain in joints of right hand: Secondary | ICD-10-CM

## 2024-06-03 DIAGNOSIS — M6281 Muscle weakness (generalized): Secondary | ICD-10-CM | POA: Diagnosis not present

## 2024-06-03 DIAGNOSIS — M25511 Pain in right shoulder: Secondary | ICD-10-CM

## 2024-06-03 DIAGNOSIS — M25542 Pain in joints of left hand: Secondary | ICD-10-CM

## 2024-06-03 DIAGNOSIS — G8929 Other chronic pain: Secondary | ICD-10-CM | POA: Diagnosis not present

## 2024-06-05 ENCOUNTER — Telehealth: Payer: Self-pay | Admitting: Gastroenterology

## 2024-06-05 MED ORDER — LINACLOTIDE 145 MCG PO CAPS: 145.0000 ug | ORAL_CAPSULE | Freq: Every day | ORAL | 5 refills | Status: DC

## 2024-06-05 NOTE — Therapy (Signed)
 OUTPATIENT OCCUPATIONAL THERAPY TREATMENT NOTE  Patient Name: Wanda Rivera MRN: [id number] DOB:[birth year], 83 y.o., female Today's Date: 06/09/2024  PCP: Rickard Charles MD REFERRING PROVIDER:  Syliva Even, MD    END OF SESSION:  OT End of Session - 06/09/24 1017     Visit Number 4    Number of Visits 5    Date for OT Re-Evaluation 06/19/24    Authorization Type Medicare    OT Start Time 1017    OT Stop Time 1047    OT Time Calculation (min) 30 min    Activity Tolerance Patient tolerated treatment well;No increased pain;Patient limited by pain    Behavior During Therapy [facility] for tasks assessed/performed                Past Medical History:  Diagnosis Date   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,hands   Breast cancer (HCC)    1982, recurrence in 1985   Cataract    Bil   Glaucoma    Bil   Osteoporosis    Past Surgical History:  Procedure Laterality Date   ABDOMINAL HYSTERECTOMY     BREAST SURGERY     double masectomy for breast cancer   eye surgery Bilateral 06/05/2017   for Glaucoma   Patient Active Problem List   Diagnosis Date Noted   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 07/11/2023   Pain in joint of right shoulder 03/13/2023   Neck swelling 01/01/2023   High vitamin D  level 01/01/2023   Chronic neck pain 01/01/2023   Numbness of fingers of both hands 12/29/2021   Right sided abdominal pain 02/23/2020   Dysphagia 08/19/2019   Primary osteoarthritis of right knee 12/26/2018   Cough 12/19/2018   Constipation 12/19/2018   Rotator cuff arthropathy of right shoulder 04/02/2018   GERD (gastroesophageal reflux disease) 12/13/2017   History of breast cancer 01/02/2017   Compression fracture of L3 vertebra (HCC) 05/04/2016   Hyperlipidemia 08/16/2015   Left carotid bruit 08/16/2015   Atherosclerotic peripheral vascular disease (HCC) 08/06/2015   Routine general medical examination at a health care facility 12/09/2014   Osteoporosis 12/09/2014   Glaucoma 12/09/2014     ONSET DATE: chronic bil thumb pain (>3-5 months)   REFERRING DIAG: M79.644,M79.645 (ICD-10-CM) - Bilateral thumb pain   THERAPY DIAG:  Pain in joint of right hand  Pain in joint of left hand  Chronic right shoulder pain  Muscle weakness (generalized)  Rationale for Evaluation and Treatment: Rehabilitation  PERTINENT HISTORY: Per MD note: "L hand and R thumb pain. Pt's last visit for her R thumb was on 01/30/23 and she was given a R 1st MCP steroid injection   Today, pt c/o R thumb pain returning about 3-wk ago. Thumb is not triggering like it was previously   Pt also c/o L thumb pain started a month or more ago. Pain is located along the 1st MCP joints bilaterally.    Additionally, she c/o mid-back pain, slightly above the bra-strap line. She is worried in general w/ her multiple pains because she is a breast cancer survivor. "  She states bil hand /thumb pain L> R, also some Lt upper trap tightness/ pain and MRI showing some pinching nerves.   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    Retired from [employer].  Hx Rt RTC tear, also some pain in bil shoulders.   PRECAUTIONS: None  RED FLAGS: None   WEIGHT BEARING RESTRICTIONS: No   SUBJECTIVE:   SUBJECTIVE STATEMENT:  She states doing well with all HEP and symptoms but still has some lateral shoulder tightness/pain.      PAIN:  Are you having pain? None significantly at rest today, mild in Rt lateral shoulder, perhaps   FALLS: Has patient fallen in last 6 months? No   PLOF: Independent  PATIENT GOALS: Improve ability and achiness of both hands  NEXT MD VISIT: As needed    OBJECTIVE: (All objective assessments below are from initial evaluation on: 05/18/24 unless otherwise specified.)   HAND DOMINANCE: Right   ADLs: Overall ADLs: States decreased ability to grab, hold household objects, pain and difficulty to open containers   FUNCTIONAL OUTCOME MEASURES: Eval: Quick DASH 14% impairment today  (Higher % Score  =   More Impairment)      UPPER EXTREMITY ROM     Shoulder to Wrist AROM Right eval Left eval Rt  /  Lt  06/16/24  Shoulder flexion     Shoulder abduction     Shoulder extension     Shoulder internal rotation     Shoulder external rotation     Elbow flexion 80 76   Elbow extension 65 66   Forearm supination     Forearm pronation      Wrist flexion     Wrist extension     Wrist ulnar deviation     Wrist radial deviation     Functional dart thrower's motion (F-DTM) in ulnar flexion     F-DTM in radial extension      (Blank rows = not tested)   Hand AROM Right eval Left eval Rt  / Lt 06/16/24  Full Fist Ability (or Gap to Distal Palmar Crease) full full   Thumb Opposition  (Kapandji Scale)  10/10 9/10   Thumb MCP (0-60) 71 65   Thumb IP (0-80) 65 67   Thumb Radial Abduction Span 35 38    Thumb Palmar Abduction Span      Index MCP (0-90)      Index PIP (0-100)      Index DIP (0-70)       Long MCP (0-90)       Long PIP (0-100)       Long DIP (0-70)       Ring MCP (0-90)       Ring PIP (0-100)       Ring DIP (0-70)       Little MCP (0-90)       Little PIP (0-100)       Little DIP (0-70)       (Blank rows = not tested)   HAND FUNCTION: 06/16/24: Grip Rt: TBD#, Lt: TBD#    Eval: Observed weakness in affected bil hand.  Grip strength Right: tender 16 lbs, Left: tender 14 lbs   SENSATION: Eval:  intermittent numbness in both hands, but none now, will follow in therapy as needed  EDEMA:   Eval:  Mildly swollen in bil thumb MCP Js   OBSERVATIONS:   Eval: Some overt arthritic joints that are tender in both hands, around the thumb, some tightness in wrist extension bilaterally.  Some possible nerve impingement.  Complains of tight upper trap as well as lateral deltoid areas which we can address as well in therapy.  Bil thumb OA and tight upper trap/lateral shoulders    TODAY'S TREATMENT:  06/09/24: Today OT gives her a new stretch to manage specifically her right  lateral shoulder tightness and pain.  This is bolded below  and is called the "sleeper stretch."  OT lays down to demonstrate, and she lays down to perform for understanding and to ensure this is nonpainful.  After 3 stretches, she stands up and states that most of her shoulder tightness is already relieved.  She was asked to do this at least 2 or 3 times a day until seen next week to help manage the symptoms.  We reviewed the rest of her home exercises as well which she seems to be doing well with.  OT asks her to "test the waters" for all functional activities at home until next week so that she can give a good and accurate account of how she is doing with her function at home.  We will likely discharge next week and she was made aware of that.  We did discuss the need for her to continue to manage her self-care/symptoms periodically and over time as a lot of her issues, like arthritis, do tend to get more irritating over time if not well-managed.    Exercises - Seated Cervical Rotation with Nod  - 3-4 x daily - 3-4 reps - 15-20 sec hold - Seated Wrist Flexion Stretch  - 3 x daily - 3 reps - 15 hold - Wrist Prayer Stretch  - 3 x daily - 3 reps - 15 sec hold - Stretch thumb toward base of small finger (put hand in LAP)  - 2-3 x daily - 3-5 reps - 15 sec hold - Thumb Webspace Stretch  - 3 x daily - 3 reps - 15 sec hold - Towel Roll Grip with Forearm in Neutral  - 3 x daily - 5 reps - 10 sec hold - Standing Shoulder Posterior Capsule Stretch  - 2-3 x daily - 3-5 reps - 15 hold - Standing Shoulder Row with Anchored Resistance  - 2-4 x daily - 1-2 sets - 10-15 reps - Sleeper Stretch  - 3-4 x daily - 5 reps - 15-20 sec hold   PATIENT EDUCATION: Education details: See tx section above for details  Person educated: Patient Education method: Engineer, structural, Teach back, Handouts  Education comprehension: States and demonstrates understanding, Additional Education required    HOME EXERCISE  PROGRAM: Access Code: [email or web address] URL: [email or web address] Date: 05/18/2024 Prepared by: Leartis Proud     GOALS: Goals reviewed with patient? Yes   SHORT TERM GOALS: (STG required if POC>30 days) Target Date: 05/29/2024  Pt will obtain protective, custom orthotic. Goal status: TBD/PRN  2.  Pt will demo/state understanding of initial HEP to improve pain levels and prerequisite motion. Goal status: 05/29/24: MET   LONG TERM GOALS: Target Date: 06/19/2024  Pt will improve functional ability by decreased impairment per Quick DASH assessment from 14% to 8% or better, for better quality of life. Goal status: INITIAL  2.  Pt will improve grip strength in bilateral hands from by at least 5 pounds each without pain for functional use at home and in IADLs. Goal status: INITIAL  3.  Pt will improve A/ROM in bilateral wrist extension from approximately 65 degrees to at least 70 degrees, to have functional motion for tasks like reach and grasp.  Goal status: INITIAL  4.  Pt will decrease pain at rest from "aching" 2/10 to "none significant" to have better sleep and occupational participation in daily roles. Goal status: INITIAL  ASSESSMENT:  CLINICAL IMPRESSION: 06/09/24: She is doing great and managing all symptoms and now has new stretch for lateral shoulder issues on the right side.  She should be excellent and fine to discharge at our last planned visit next week.  She was told to prepare for this plan discharge and come with any final questions or concerns that she may have  06/03/24: She now understands her home exercise programs, but needs to get consistent.  It is excellent that her shoulder pain and tightness is decreasing and has no more spasm.  05/29/24: Doing much better though needs to master HEP still for long-term management     PLAN:  OT FREQUENCY: 1x/week  OT DURATION: 4 weeks through 06/19/24 and up to 5 total visits as needed.   PLANNED  INTERVENTIONS: 97535 self care/ADL training, 16109 therapeutic exercise, 97530 therapeutic activity, 97112 neuromuscular re-education, 97140 manual therapy, 97760 Orthotic Initial, [phone] Orthotic/Prosthetic subsequent, compression bandaging, Dry needling, energy conservation, coping strategies training, patient/family education, and DME and/or AE instructions  RECOMMENDED OTHER SERVICES: none now   CONSULTED AND AGREED WITH PLAN OF CARE: Patient  PLAN FOR NEXT SESSION:  Do progress note check all goals and likely discharge as planned  Leartis Proud, OTR/L, CHT 06/09/2024, 10:54 AM

## 2024-06-05 NOTE — Telephone Encounter (Signed)
 Inbound call from patient, would like to speak with Mylinda Asa in regards to paperwork she states she gave her during her last OV. She did not wish to discuss any further would me, would just like message sent to [facility].

## 2024-06-05 NOTE — Telephone Encounter (Signed)
 Left message for patient to return my call.

## 2024-06-05 NOTE — Telephone Encounter (Signed)
 Patient states she has read over the [employer]  financial assistance forms and she states she will not qualify for the assistance. Patient states she will just pay for the medication with help from her family. Patient wanted to let us  know. Wanda Rivera. Prescription sent to patient's pharmacy.

## 2024-06-08 ENCOUNTER — Other Ambulatory Visit: Payer: Self-pay

## 2024-06-08 ENCOUNTER — Ambulatory Visit (INDEPENDENT_AMBULATORY_CARE_PROVIDER_SITE_OTHER)

## 2024-06-08 ENCOUNTER — Ambulatory Visit (INDEPENDENT_AMBULATORY_CARE_PROVIDER_SITE_OTHER): Admitting: Family Medicine

## 2024-06-08 VITALS — BP 118/70 | HR 75 | Ht 64.0 in | Wt 132.0 lb

## 2024-06-08 DIAGNOSIS — M19011 Primary osteoarthritis, right shoulder: Secondary | ICD-10-CM | POA: Diagnosis not present

## 2024-06-08 DIAGNOSIS — M25511 Pain in right shoulder: Secondary | ICD-10-CM | POA: Diagnosis not present

## 2024-06-08 DIAGNOSIS — M778 Other enthesopathies, not elsewhere classified: Secondary | ICD-10-CM | POA: Diagnosis not present

## 2024-06-08 NOTE — Patient Instructions (Addendum)
 Thank you for coming in today.   Continue hand therapy and home exercises  Please get an Xray today before you leave   Check back as needed

## 2024-06-08 NOTE — Progress Notes (Signed)
   Joanna Muck, PhD, LAT, ATC acting as a scribe for Garlan Juniper, MD.  Wanda Rivera is a 83 y.o. female who presents to [facility] at [facility] today for 60-month f/u bilat thumb, L ankle pain and cervical radiculopathy. Pt was last seen by Dr. Alease Hunter on 05/06/24 and was taught HEP for her ankle. Also referred to hand therapy, completing 3 visits.  Today, pt reports bilat thumb pain is much improved. OT also helped her w/ her thoracic back pain. Ankle is fine. She notes when doing HEP she will have some R shoulder pain, esp w/ horz aDd.  Dx testing: 02/13/24 DEXA scan   06/29/23 C-spine MRI  Pertinent review of systems: No fevers or chills  Relevant historical information: Breast cancer history 40 years ago.   Exam:  BP 118/70   Pulse 75   Ht 5\' 4"  (1.626 m)   Wt 132 lb (59.9 kg)   SpO2 98%   BMI 22.66 kg/m  General: Well Developed, well nourished, and in no acute distress.   MSK: Right shoulder normal-appearing normal motion pain with abduction and crossover.  Some pain with resisted abduction strength testing. Positive Hawkins and Neer's test.    Lab and Radiology Results  X-ray images right shoulder obtained today personally and independently interpreted. Clips present in the axilla.  Rotator cuff arthropathy is present with mild elevation of the humeral head.  No acute fractures are visible. Await formal radiology review   Assessment and Plan: 83 y.o. female with chronic right shoulder pain with recent exacerbation.  Patient does have degenerative changes around the shoulder and likely rotator cuff impingement or partial chronic tear.  She would like to avoid an injection or surgery.  Plan to continue occupational therapy and home exercise program.  If not improving let me know neck step would be MRI.   PDMP not reviewed this encounter. Orders Placed This Encounter  Procedures   DG Shoulder Right    Standing Status:   Future    Number of  Occurrences:   1    Expiration Date:   06/08/2025    Reason for Exam (SYMPTOM  OR DIAGNOSIS REQUIRED):   right shoulder pain    Preferred imaging location?:   [facility] [place] [facility]   No orders of the defined types were placed in this encounter.    Discussed warning signs or symptoms. Please see discharge instructions. Patient expresses understanding.   The above documentation has been reviewed and is accurate and complete Garlan Juniper, M.D.

## 2024-06-09 ENCOUNTER — Ambulatory Visit (INDEPENDENT_AMBULATORY_CARE_PROVIDER_SITE_OTHER): Admitting: Rehabilitative and Restorative Service Providers"

## 2024-06-09 ENCOUNTER — Encounter: Payer: Self-pay | Admitting: Rehabilitative and Restorative Service Providers"

## 2024-06-09 DIAGNOSIS — M6281 Muscle weakness (generalized): Secondary | ICD-10-CM

## 2024-06-09 DIAGNOSIS — G8929 Other chronic pain: Secondary | ICD-10-CM

## 2024-06-09 DIAGNOSIS — M25542 Pain in joints of left hand: Secondary | ICD-10-CM | POA: Diagnosis not present

## 2024-06-09 DIAGNOSIS — M25511 Pain in right shoulder: Secondary | ICD-10-CM

## 2024-06-09 DIAGNOSIS — M25541 Pain in joints of right hand: Secondary | ICD-10-CM | POA: Diagnosis not present

## 2024-06-10 ENCOUNTER — Ambulatory Visit: Payer: Self-pay | Admitting: Family Medicine

## 2024-06-10 NOTE — Progress Notes (Signed)
Right shoulder x-ray shows arthritis in the main shoulder joint.

## 2024-06-12 NOTE — Therapy (Signed)
 OUTPATIENT OCCUPATIONAL THERAPY TREATMENT & DISCHARGE NOTE  Patient Name: Wanda Rivera MRN: [id number] DOB:[birth year], 83 y.o., female Today's Date: 06/16/2024  PCP: Rickard Charles MD REFERRING PROVIDER:  Syliva Even, MD                 OCCUPATIONAL THERAPY DISCHARGE SUMMARY  Visits from Start of Care: 5  Current functional level related to goals / functional outcomes: 06/16/24: Has now met all LTGs except wrist AROM, but has no pain, excellent grip strength, no issues in bil UEs.  No paresthesia complaints now either. Agrees to D/C today   Education / Equipment: Pt has all needed materials and education. Pt understands how to continue on with self-management. See tx notes for more details.   Patient agrees to discharge due to max benefits received from outpatient occupational therapy / hand therapy at this time.   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,hands   Breast cancer (HCC)    1982, recurrence in 1985   Cataract    Bil   Glaucoma    Bil   Osteoporosis    Past Surgical History:  Procedure Laterality Date   ABDOMINAL HYSTERECTOMY     BREAST SURGERY     double masectomy for breast cancer   eye surgery Bilateral 06/05/2017   for Glaucoma   Patient Active Problem List   Diagnosis Date Noted   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 07/11/2023   Pain in joint of right shoulder 03/13/2023   Neck swelling 01/01/2023   High vitamin D  level 01/01/2023   Chronic neck pain 01/01/2023    Numbness of fingers of both hands 12/29/2021   Right sided abdominal pain 02/23/2020   Dysphagia 08/19/2019   Primary osteoarthritis of right knee 12/26/2018   Cough 12/19/2018   Constipation 12/19/2018   Rotator cuff arthropathy of right shoulder 04/02/2018   GERD (gastroesophageal reflux disease) 12/13/2017   History of breast cancer 01/02/2017   Compression fracture of L3 vertebra (HCC) 05/04/2016   Hyperlipidemia 08/16/2015   Left carotid bruit 08/16/2015   Atherosclerotic peripheral vascular disease (HCC) 08/06/2015   Routine general medical examination at a health care facility 12/09/2014   Osteoporosis 12/09/2014   Glaucoma 12/09/2014    ONSET DATE: chronic bil thumb pain (>3-5 months)   REFERRING DIAG: M79.644,M79.645 (ICD-10-CM) - Bilateral thumb pain   THERAPY DIAG:  Pain in joint of right hand  Pain in joint of left hand  Chronic right shoulder pain  Muscle weakness (generalized)  Rationale for Evaluation and Treatment: Rehabilitation  PERTINENT HISTORY: Per MD note: L hand and R thumb pain. Pt's last visit for her R thumb was on 01/30/23 and she was given a R 1st MCP steroid injection   Today, pt c/o R thumb pain returning about 3-wk ago. Thumb is not triggering  like it was previously   Pt also c/o L thumb pain started a month or more ago. Pain is located along the 1st MCP joints bilaterally.    Additionally, she c/o mid-back pain, slightly above the bra-strap line. She is worried in general w/ her multiple pains because she is a breast cancer survivor.   She states bil hand /thumb pain L> R, also some Lt upper trap tightness/ pain and MRI showing some pinching nerves.   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    Retired from [employer].  Hx Rt RTC tear, also some pain in bil shoulders.   PRECAUTIONS: None  RED FLAGS: None   WEIGHT BEARING RESTRICTIONS: No   SUBJECTIVE:   SUBJECTIVE STATEMENT: She states managing all of her symptoms well, not having pain or  paresthesia, she feels ready to discharge     PAIN:  Are you having pain? None significantly at rest today, mild in Rt lateral shoulder, perhaps    PATIENT GOALS: Improve ability and achiness of both hands  NEXT MD VISIT: As needed    OBJECTIVE: (All objective assessments below are from initial evaluation on: 05/18/24 unless otherwise specified.)   HAND DOMINANCE: Right   ADLs: Overall ADLs: States decreased ability to grab, hold household objects, pain and difficulty to open containers   FUNCTIONAL OUTCOME MEASURES: 06/16/24: Quick DASH 4% issues now    Eval: Quick DASH 14% impairment today  (Higher % Score  =  More Impairment)      UPPER EXTREMITY ROM     Shoulder to Wrist AROM Right eval Left eval  Shoulder flexion    Shoulder abduction    Shoulder extension    Shoulder internal rotation    Shoulder external rotation    Elbow flexion 80 76  Elbow extension 65 66  Forearm supination    Forearm pronation     Wrist flexion    Wrist extension    Wrist ulnar deviation    Wrist radial deviation    Functional dart thrower's motion (F-DTM) in ulnar flexion    F-DTM in radial extension     (Blank rows = not tested)   Hand AROM Right eval Left eval Rt  / Lt 06/16/24  Full Fist Ability (or Gap to Distal Palmar Crease) full full   Thumb Opposition  (Kapandji Scale)  10/10 9/10   Thumb MCP (0-60) 71 65 68  Thumb IP (0-80) 65 67 66  Thumb Radial Abduction Span 35 38  40  Thumb Palmar Abduction Span      Index MCP (0-90)      Index PIP (0-100)      Index DIP (0-70)       Long MCP (0-90)       Long PIP (0-100)       Long DIP (0-70)       Ring MCP (0-90)       Ring PIP (0-100)       Ring DIP (0-70)       Little MCP (0-90)       Little PIP (0-100)       Little DIP (0-70)       (Blank rows = not tested)   HAND FUNCTION: 06/16/24: Grip Rt: 39#, Lt: 36#    Eval: Observed weakness in affected bil hand.  Grip strength Right: tender 16 lbs, Left: tender 14  lbs   SENSATION: Eval:  intermittent numbness in both hands, but none now, will follow in therapy as  needed  EDEMA:   Eval:  Mildly swollen in bil thumb MCP Js   OBSERVATIONS:   Eval: Some overt arthritic joints that are tender in both hands, around the thumb, some tightness in wrist extension bilaterally.  Some possible nerve impingement.  Complains of tight upper trap as well as lateral deltoid areas which we can address as well in therapy.  Bil thumb OA and tight upper trap/lateral shoulders    TODAY'S TREATMENT:  06/16/24: Pt performs AROM, gripping, and strength with bil hands/arms against therapist's resistance for exercise/activities as well as new measures today. OT also discusses home and functional tasks as well as safety and prevention of exacerbation with the pt and reviews goals.  OT also reviews home exercises and provides final recommendations keep managing symptoms and tightness in her body.  She states understanding her exercise program, rates her ability much better today, has excellent and doubled grip strength now.  Successful discharge    Exercises Reviewed today:  - Seated Cervical Rotation with Nod  - 3-4 x daily - 3-4 reps - 15-20 sec hold - Seated Wrist Flexion Stretch  - 3 x daily - 3 reps - 15 hold - Wrist Prayer Stretch  - 3 x daily - 3 reps - 15 sec hold - Stretch thumb toward base of small finger (put hand in LAP)  - 2-3 x daily - 3-5 reps - 15 sec hold - Thumb Webspace Stretch  - 3 x daily - 3 reps - 15 sec hold - Towel Roll Grip with Forearm in Neutral  - 3 x daily - 5 reps - 10 sec hold - Standing Shoulder Posterior Capsule Stretch  - 2-3 x daily - 3-5 reps - 15 hold - Standing Shoulder Row with Anchored Resistance  - 2-4 x daily - 1-2 sets - 10-15 reps - Sleeper Stretch  - 3-4 x daily - 5 reps - 15-20 sec hold   PATIENT EDUCATION: Education details: See tx section above for details  Person educated: Patient Education method: Verbal Instruction, Teach  back, Handouts  Education comprehension: States and demonstrates understanding   HOME EXERCISE PROGRAM: Access Code: [email or web address] URL: [email or web address] Date: 05/18/2024 Prepared by: Leartis Proud     GOALS: Goals reviewed with patient? Yes   SHORT TERM GOALS: (STG required if POC>30 days) Target Date: 05/29/2024  Pt will obtain protective, custom orthotic. Goal status: 06/16/24: N/A D/C  2.  Pt will demo/state understanding of initial HEP to improve pain levels and prerequisite motion. Goal status: 05/29/24: MET   LONG TERM GOALS: Target Date: 06/19/2024  Pt will improve functional ability by decreased impairment per Quick DASH assessment from 14% to 8% or better, for better quality of life. Goal status: 06/16/24: MET  2.  Pt will improve grip strength in bilateral hands from by at least 5 pounds each without pain for functional use at home and in IADLs. Goal status: 06/16/24: MET  3.  Pt will improve A/ROM in bilateral wrist extension from approximately 65 degrees to at least 70 degrees, to have functional motion for tasks like reach and grasp.  Goal status: 06/16/24: Not met, but rates no issues now   4.  Pt will decrease pain at rest from aching 2/10 to none significant to have better sleep and occupational participation in daily roles. Goal status: 06/16/24: MET  ASSESSMENT:  CLINICAL IMPRESSION: 06/16/24: Has now met all LTGs except wrist AROM, but has no pain, excellent grip strength, no issues in bil UEs.  No paresthesia  complaints now either. Agrees to D/C today     PLAN:  OT FREQUENCY: D/C  OT DURATION: D/C PLANNED INTERVENTIONS: 97535 self care/ADL training, 53664 therapeutic exercise, 97530 therapeutic activity, 97112 neuromuscular re-education, 97140 manual therapy, 97760 Orthotic Initial, [id number] Orthotic/Prosthetic subsequent, compression bandaging, Dry needling, energy conservation, coping strategies training, patient/family education,  and DME and/or AE instructions  RECOMMENDED OTHER SERVICES: none now   CONSULTED AND AGREED WITH PLAN OF CARE: Patient  PLAN FOR NEXT SESSION:  N/A, DC  Leartis Proud, OTR/L, CHT 06/16/2024, 11:40 AM

## 2024-06-16 ENCOUNTER — Ambulatory Visit (INDEPENDENT_AMBULATORY_CARE_PROVIDER_SITE_OTHER): Admitting: Rehabilitative and Restorative Service Providers"

## 2024-06-16 ENCOUNTER — Encounter: Payer: Self-pay | Admitting: Rehabilitative and Restorative Service Providers"

## 2024-06-16 DIAGNOSIS — M25542 Pain in joints of left hand: Secondary | ICD-10-CM

## 2024-06-16 DIAGNOSIS — M6281 Muscle weakness (generalized): Secondary | ICD-10-CM

## 2024-06-16 DIAGNOSIS — M25541 Pain in joints of right hand: Secondary | ICD-10-CM | POA: Diagnosis not present

## 2024-06-16 DIAGNOSIS — G8929 Other chronic pain: Secondary | ICD-10-CM

## 2024-06-16 DIAGNOSIS — M25511 Pain in right shoulder: Secondary | ICD-10-CM

## 2024-07-10 ENCOUNTER — Encounter: Payer: Medicare Other | Admitting: Adult Health

## 2024-07-17 NOTE — Progress Notes (Signed)
 [facility] Cancer Follow up:    Wanda Almarie LABOR, MD [address] [place] [address]   DIAGNOSIS: history of breast cancer, BRCA positive   SUMMARY OF ONCOLOGIC HISTORY: Diagnosed 1982 and  1985 - mastectomy bilateral 1982 (left) and 1985 (right) Brca gene mutation +, s/p TAH and oophorectomy.  CURRENT THERAPY: observation  INTERVAL HISTORY:  Discussed the use of AI scribe software for clinical note transcription with the patient, who gave verbal consent to proceed.  History of Present Illness Wanda Rivera is an 83 year old female with a history of breast cancer who presents for follow-up and evaluation.  She has a history of breast cancer diagnosed in 1982 and 1985, treated with left and right mastectomies. She is BRCA positive, though the specific mutation is unclear. She underwent a total abdominal hysterectomy and oophorectomy. Her family history includes breast cancer in her mother and a BRCA-positive niece who underwent prophylactic surgery. She remains vigilant about skin changes and has regular dermatological evaluations.   Patient Active Problem List   Diagnosis Date Noted   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 07/11/2023   Pain in joint of right shoulder 03/13/2023   Neck swelling 01/01/2023   High vitamin D  level 01/01/2023   Chronic neck pain 01/01/2023   Numbness of fingers of both hands 12/29/2021   Right sided abdominal pain 02/23/2020   Dysphagia 08/19/2019   Primary osteoarthritis of right knee 12/26/2018   Cough 12/19/2018   Constipation 12/19/2018   Rotator cuff arthropathy of right shoulder 04/02/2018   GERD (gastroesophageal reflux disease) 12/13/2017   History of breast cancer 01/02/2017   Compression fracture of L3 vertebra (HCC) 05/04/2016   Hyperlipidemia 08/16/2015   Left carotid bruit 08/16/2015   Atherosclerotic peripheral vascular disease (HCC) 08/06/2015   Routine general medical examination at a health care  facility 12/09/2014   Osteoporosis 12/09/2014   Glaucoma 12/09/2014    is allergic to darvon [propoxyphene], morphine and codeine, percocet [oxycodone-acetaminophen ], and vicodin [hydrocodone-acetaminophen ].  MEDICAL HISTORY: Past Medical History:  Diagnosis Date   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,hands   Breast cancer (HCC)    1982, recurrence in 1985   Cataract    Bil   Glaucoma    Bil   Osteoporosis     SURGICAL HISTORY: Past Surgical History:  Procedure Laterality Date   ABDOMINAL HYSTERECTOMY     BREAST SURGERY     double masectomy for breast cancer   eye surgery Bilateral 06/05/2017   for Glaucoma    SOCIAL HISTORY: Social History   Socioeconomic History   Marital status: Divorced    Spouse name: Not on file   Number of children: 1   Years of education: Not on file   Highest education level: Not on file  Occupational History   Occupation: retired  Tobacco Use   Smoking status: Never   Smokeless tobacco: Never  Vaping Use   Vaping status: Never Used  Substance and Sexual Activity   Alcohol use: Yes    Alcohol/week: 0.0 standard drinks of alcohol    Comment: occasionally a glass of red wine   Drug use: No   Sexual activity: Never  Other Topics Concern   Not on file  Social History Narrative   Lives alone.   Social Drivers of Corporate investment banker Strain: Low Risk  (01/30/2024)   Overall Financial Resource Strain (CARDIA)    Difficulty of Paying Living Expenses: Not hard at all  Food Insecurity:  No Food Insecurity (01/30/2024)   Hunger Vital Sign    Worried About Running Out of Food in the Last Year: Never true    Ran Out of Food in the Last Year: Never true  Transportation Needs: No Transportation Needs (01/30/2024)   PRAPARE - Administrator, Civil Service (Medical): No    Lack of Transportation (Non-Medical): No  Physical Activity: Insufficiently Active (01/30/2024)   Exercise Vital Sign    Days of Exercise per Week: 3 days     Minutes of Exercise per Session: 30 min  Stress: No Stress Concern Present (01/30/2024)   [employer] of Occupational Health - Occupational Stress Questionnaire    Feeling of Stress : Not at all  Social Connections: Moderately Integrated (01/30/2024)   Social Connection and Isolation Panel    Frequency of Communication with Friends and Family: More than three times a week    Frequency of Social Gatherings with Friends and Family: Three times a week    Attends Religious Services: More than 4 times per year    Active Member of Clubs or Organizations: Yes    Attends Banker Meetings: Never    Marital Status: Divorced  Catering manager Violence: Patient Unable To Answer (01/30/2024)   Humiliation, Afraid, Rape, and Kick questionnaire    Fear of Current or Ex-Partner: Patient unable to answer    Emotionally Abused: Patient unable to answer    Physically Abused: Patient unable to answer    Sexually Abused: Patient unable to answer    FAMILY HISTORY: Family History  Problem Relation Age of Onset   Cancer Mother    Breast cancer Mother    Liver cancer Mother    Diabetes Father    Hypertension Father    Hypertension Sister    Diabetes Brother    Heart disease Brother    Breast cancer Niece     Review of Systems  Constitutional:  Negative for appetite change, chills, fatigue, fever and unexpected weight change.  HENT:   Negative for hearing loss, lump/mass and trouble swallowing.   Eyes:  Negative for eye problems and icterus.  Respiratory:  Negative for chest tightness, cough and shortness of breath.   Cardiovascular:  Negative for chest pain, leg swelling and palpitations.  Gastrointestinal:  Negative for abdominal distention, abdominal pain, constipation, diarrhea, nausea and vomiting.  Endocrine: Negative for hot flashes.  Genitourinary:  Negative for difficulty urinating.   Musculoskeletal:  Negative for arthralgias.  Skin:  Negative for itching and rash.   Neurological:  Negative for dizziness, extremity weakness, headaches and numbness.  Hematological:  Negative for adenopathy. Does not bruise/bleed easily.  Psychiatric/Behavioral:  Negative for depression. The patient is not nervous/anxious.       PHYSICAL EXAMINATION    Vitals:   07/20/24 1053  BP: (!) 156/68  Pulse: (!) 56  Resp: 18  Temp: 98 F (36.7 C)  SpO2: 100%    Physical Exam Constitutional:      General: She is not in acute distress.    Appearance: Normal appearance. She is not toxic-appearing.  HENT:     Head: Normocephalic and atraumatic.     Mouth/Throat:     Mouth: Mucous membranes are moist.     Pharynx: Oropharynx is clear. No oropharyngeal exudate or posterior oropharyngeal erythema.  Eyes:     General: No scleral icterus. Cardiovascular:     Rate and Rhythm: Normal rate and regular rhythm.     Pulses: Normal pulses.  Heart sounds: Normal heart sounds.  Pulmonary:     Effort: Pulmonary effort is normal.     Breath sounds: Normal breath sounds.  Chest:     Comments: S/p mastectomies no sign of local recurrence Abdominal:     General: Abdomen is flat. Bowel sounds are normal. There is no distension.     Palpations: Abdomen is soft.     Tenderness: There is no abdominal tenderness.  Musculoskeletal:        General: No swelling.     Cervical back: Neck supple.  Lymphadenopathy:     Cervical: No cervical adenopathy.     Upper Body:     Right upper body: No supraclavicular or axillary adenopathy.     Left upper body: No supraclavicular or axillary adenopathy.  Skin:    General: Skin is warm and dry.     Findings: No rash.  Neurological:     General: No focal deficit present.     Mental Status: She is alert.  Psychiatric:        Mood and Affect: Mood normal.        Behavior: Behavior normal.     LABORATORY DATA:  CBC    Component Value Date/Time   WBC 5.5 01/07/2024 1017   RBC 4.48 01/07/2024 1017   HGB 14.2 01/07/2024 1017   HCT  43.0 01/07/2024 1017   PLT 182.0 01/07/2024 1017   MCV 96.1 01/07/2024 1017   MCV 92.6 01/10/2017 1059   MCH 31.4 05/03/2019 1405   MCHC 33.0 01/07/2024 1017   RDW 13.9 01/07/2024 1017    CMP     Component Value Date/Time   NA 142 01/07/2024 1017   NA 142 01/02/2017 1235   K 4.8 01/07/2024 1017   CL 105 01/07/2024 1017   CO2 32 01/07/2024 1017   GLUCOSE 89 01/07/2024 1017   BUN 11 01/07/2024 1017   BUN 15 01/02/2017 1235   CREATININE 0.95 01/07/2024 1017   CREATININE 1.31 (H) 02/22/2015 1154   CALCIUM  9.6 01/07/2024 1017   PROT 7.0 01/07/2024 1017   PROT 6.5 01/02/2017 1235   ALBUMIN 4.4 01/07/2024 1017   ALBUMIN 4.4 01/02/2017 1235   AST 26 01/07/2024 1017   ALT 16 01/07/2024 1017   ALKPHOS 96 01/07/2024 1017   BILITOT 0.6 01/07/2024 1017   BILITOT 0.5 01/02/2017 1235   GFRNONAA 48 (L) 05/03/2019 1405   GFRNONAA 50 (L) 08/11/2014 1452   GFRAA 55 (L) 05/03/2019 1405   GFRAA 57 (L) 08/11/2014 1452     ASSESSMENT and THERAPY PLAN:   Assessment and Plan Assessment & Plan Breast cancer History of bilateral mastectomy with no recurrence. BRCA positive status requires regular monitoring. - Schedule follow-up appointment for next year. - Monitor for new lumps or concerns and contact clinic if she arises.  BRCA positive status Uncertain BRCA1 or BRCA2 status. New guidelines suggest different screening based on BRCA type. No family history of pancreatic cancer. Increased melanoma and pancreatic cancer risk with BRCA2. - Review genetic testing records to determine BRCA1 or BRCA2 status. - Consider GI referral for pancreatic cancer screening if BRCA2 positive. - Ensure annual dermatology follow-up for melanoma risk.  Arthritis Affects arms and left thumb. Managed with home exercises and Tylenol . - Continue home exercises. - Use Tylenol  for pain management as needed.  Degenerative disc disease Longstanding neck condition managed with physical therapy and exercises. -  Continue home exercises as instructed by physical therapy.   All questions were answered. The patient  knows to call the clinic with any problems, questions or concerns. We can certainly see the patient much sooner if necessary.  Total encounter time:30 minutes*in face-to-face visit time, chart review, lab review, care coordination, order entry, and documentation of the encounter time.    Morna Kendall, NP 07/20/24 1:36 PM Medical Oncology and Hematology [facility] [address] [place], [place] [address] Tel. [phone]    Fax. [phone]  *Total Encounter Time as defined by the Centers for Medicare and Medicaid Services includes, in addition to the face-to-face time of a patient visit (documented in the note above) non-face-to-face time: obtaining and reviewing outside history, ordering and reviewing medications, tests or procedures, care coordination (communications with other health care professionals or caregivers) and documentation in the medical record.

## 2024-07-20 ENCOUNTER — Inpatient Hospital Stay: Attending: Adult Health | Admitting: Adult Health

## 2024-07-20 ENCOUNTER — Encounter: Payer: Self-pay | Admitting: Adult Health

## 2024-07-20 VITALS — BP 156/68 | HR 56 | Temp 98.0°F | Resp 18 | Ht 64.0 in | Wt 131.6 lb

## 2024-07-20 DIAGNOSIS — Z853 Personal history of malignant neoplasm of breast: Secondary | ICD-10-CM | POA: Diagnosis not present

## 2024-07-20 DIAGNOSIS — Z1501 Genetic susceptibility to malignant neoplasm of breast: Secondary | ICD-10-CM | POA: Diagnosis not present

## 2024-07-20 DIAGNOSIS — M1990 Unspecified osteoarthritis, unspecified site: Secondary | ICD-10-CM | POA: Diagnosis not present

## 2024-07-20 DIAGNOSIS — Z1509 Genetic susceptibility to other malignant neoplasm: Secondary | ICD-10-CM | POA: Insufficient documentation

## 2024-07-20 DIAGNOSIS — Z9013 Acquired absence of bilateral breasts and nipples: Secondary | ICD-10-CM | POA: Insufficient documentation

## 2024-07-20 DIAGNOSIS — Z803 Family history of malignant neoplasm of breast: Secondary | ICD-10-CM | POA: Diagnosis not present

## 2024-07-23 ENCOUNTER — Telehealth: Payer: Self-pay | Admitting: *Deleted

## 2024-07-23 NOTE — Telephone Encounter (Signed)
 Pt called to make office aware that she found her genetic testing results that were done in 2009. She stated that she has the BRCA 1 gene type. This nurse advised pt to bring in genetic testing results to be scanned into media for review with next visit and other visits by other providers. Pt verbalized understanding.

## 2024-08-06 ENCOUNTER — Other Ambulatory Visit: Payer: Self-pay | Admitting: Internal Medicine

## 2024-08-12 DIAGNOSIS — H401123 Primary open-angle glaucoma, left eye, severe stage: Secondary | ICD-10-CM | POA: Diagnosis not present

## 2024-09-18 ENCOUNTER — Encounter: Payer: Self-pay | Admitting: Gastroenterology

## 2024-09-18 ENCOUNTER — Ambulatory Visit (INDEPENDENT_AMBULATORY_CARE_PROVIDER_SITE_OTHER): Admitting: Gastroenterology

## 2024-09-18 VITALS — BP 112/72 | HR 60 | Ht 64.0 in | Wt 130.0 lb

## 2024-09-18 DIAGNOSIS — R109 Unspecified abdominal pain: Secondary | ICD-10-CM

## 2024-09-18 DIAGNOSIS — K5900 Constipation, unspecified: Secondary | ICD-10-CM

## 2024-09-18 MED ORDER — LINACLOTIDE 145 MCG PO CAPS: 145.0000 ug | ORAL_CAPSULE | Freq: Every day | ORAL | 1 refills | Status: AC

## 2024-09-18 NOTE — Patient Instructions (Addendum)
 _______________________________________________________  If your blood pressure at your visit was 140/90 or greater, please contact your primary care physician to follow up on this.  _______________________________________________________  If you are age 82 or older, your body mass index should be between 23-30. Your Body mass index is 22.31 kg/m. If this is out of the aforementioned range listed, please consider follow up with your Primary Care Provider.  If you are age 71 or younger, your body mass index should be between 19-25. Your Body mass index is 22.31 kg/m. If this is out of the aformentioned range listed, please consider follow up with your Primary Care Provider.   ________________________________________________________  The [facility] providers would like to encourage you to use MYCHART to communicate with providers for non-urgent requests or questions.  Due to long hold times on the telephone, sending your provider a message by [facility] may be a faster and more efficient way to get a response.  Please allow 48 business hours for a response.  Please remember that this is for non-urgent requests.  _______________________________________________________  [facility] is using a team-based approach to care.  Your team is made up of your doctor and two to three APPS. Our APPS (Nurse Practitioners and Physician Assistants) work with your physician to ensure care continuity for you. They are fully qualified to address your health concerns and develop a treatment plan. They communicate directly with your gastroenterologist to care for you. Seeing the Advanced Practice Practitioners on your physician's team can help you by facilitating care more promptly, often allowing for earlier appointments, access to diagnostic testing, procedures, and other specialty referrals.   Linzess  was sent to [facility].  Linzess  works best when taken once a day every day, on an empty  stomach, at least 30 minutes before your first meal of the day.  When Linzess  is taken daily as directed:  *Constipation relief is typically felt in about a week *IBS-C patients may begin to experience relief from belly pain and overall abdominal symptoms (pain, discomfort, and bloating) in about 1 week,   with symptoms typically improving over 12 weeks.  Diarrhea may occur in the first 2 weeks -keep taking it.  The diarrhea should go away and you should start having normal, complete, full bowel movements. It may be helpful to start treatment when you can be near the comfort of your own bathroom, such as a weekend.   You have been scheduled for an abdominal ultrasound at [facility] (1st floor of hospital) on 09-25-24 at 9:00am. Please arrive 15 minutes prior to your appointment for registration. Make certain not to have anything to eat or drink 6 hours prior to your appointment. Should you need to reschedule your appointment, please contact radiology at [phone]. This test typically takes about 30 minutes to perform.  Due to recent changes in healthcare laws, you may see the results of your imaging and laboratory studies on MyChart before your provider has had a chance to review them.  We understand that in some cases there may be results that are confusing or concerning to you. Not all laboratory results come back in the same time frame and the provider may be waiting for multiple results in order to interpret others.  Please give us  48 hours in order for your provider to thoroughly review all the results before contacting the office for clarification of your results.   Thank you for entrusting me with your care and choosing [facility].  Camie Furbish, PA-C

## 2024-09-18 NOTE — Progress Notes (Signed)
 Wanda Rivera [id number] 1941-02-21   Chief Complaint: Abdominal pain  Referring Provider: Rollene Almarie LABOR, * Primary GI MD: Dr. Shila  HPI: Wanda Rivera is a 83 y.o. female with past medical history of breast cancer in 1982, with recurrence in 1985, chronic right sided abdominal pain, chronic constipation who presents today for follow up.  Seen in the office 08/23/2023 by Nestor Blower, PA-C for evaluation of chronic constipation and intermittent RLQ pain ongoing since 2021.  She has previously had extensive negative workup including ultrasound, CT scans, imaging of her back, HIDA scan without definitive cause of pain.  Thought to be possibly musculoskeletal.  Improves with Tylenol .   She did well on Linzess  samples, but her insurance did not cover the medication and it was too expensive for her.  She had been taking MiraLAX  on a regular basis with some improvement.  At last visit 06/02/2024 patient reported constipation overall managed with MiraLAX  and prune juice, though she did have better results with Linzess .  However she was not able to afford the medication at that time.  Recently found out there was a decrease in the price with her insurance and wanted to pursue Linzess  again.  Was given samples of 145 mcg and prescription sent.  Abdominal pain was controlled on dicyclomine , but advised patient to try to decrease dose to once daily or as needed to avoid side effects (was taking twice daily).  GERD symptoms controlled on omeprazole  40 mg twice daily.   Patient states she continues to have intermittent RLQ abdominal pain.  Comes and goes and is not present daily.  She has noticed that pain feels better when she is taking Linzess , or in general when she has a good bowel movement.  Found that Linzess  would be cheaper through [facility] and would like us  to send prescription that way rather than through [facility], where the price has recently increased  States that the  pain in her RLQ can radiate upwards into her right abdomen, right back, and shoulder.  Has noticed that application of Salonpas patches helps.  Pain is achy in nature and random in onset.  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 No further workup is required.   HIDA scan 11/25/2020 Normal study with gallbladder ejection fraction of 82%   CT A/P 03/11/2020 - No evidence of bowel obstruction.  Normal appendix. - Mild to moderate colonic stool burden, suggesting mild constipation.   EGD 02/20/2017 - Normal esophagus.  - Gastritis. Biopsied and negative for H. pylori - Duodenal erosions without bleeding.   Colonoscopy 03/13/2016 - Non- bleeding internal hemorrhoids.  - The examination was otherwise normal.  - No specimens collected. 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      brimonidine (ALPHAGAN) 0.2 % ophthalmic solution Place 1 drop into  the right eye in the morning and at bedtime.     Calcium  Carbonate (CALCIUM  600 PO) Take 1 tablet by mouth in the morning and at bedtime.     Cyanocobalamin  (VITAMIN B 12 PO) Take 500 mg by mouth daily.     diclofenac  Sodium (VOLTAREN ) 1 % GEL Apply 4 g topically 4 (four) times daily. 100 g 0   dicyclomine  (BENTYL ) 10 MG capsule TAKE 1 CAPSULE TWICE DAILY AS NEEDED FOR SPASMS 180 capsule 3   dorzolamide-timolol (COSOPT) 22.3-6.8 MG/ML ophthalmic solution Place 1 drop into both  eyes 2 (two) times daily.     DULoxetine  (CYMBALTA ) 20 MG capsule TAKE 1 CAPSULE EVERY DAY 90 capsule 3   latanoprost (XALATAN) 0.005 % ophthalmic solution Place 1 drop into both eyes at bedtime.     Latanoprostene Bunod (VYZULTA OP) Place 1 drop into the right eye daily.     lidocaine  (LIDODERM ) 5 % Place 1 patch onto the skin daily. Remove & Discard patch within 12 hours or as directed by MD 30 patch 0   meclizine  (ANTIVERT ) 12.5 MG tablet Take 12.5 mg by mouth 3 (three) times daily as needed for dizziness.      Multiple Vitamin (ONE-A-DAY ESSENTIAL PO) Take by mouth daily.     Omega-3 Fatty Acids (FISH OIL) 500 MG CAPS Take by mouth.     omeprazole  (PRILOSEC) 40 MG capsule TAKE 1 CAPSULE TWICE DAILY 180 capsule 3   polyethylene glycol (MIRALAX  / GLYCOLAX ) 17 g packet Take 17 g by mouth as needed.     rosuvastatin  (CRESTOR ) 10 MG tablet TAKE 1 TABLET EVERY DAY 90 tablet 1   Turmeric 500 MG TABS Take 1 tablet by mouth daily.      linaclotide  (LINZESS ) 145 MCG CAPS capsule Take 1 capsule (145 mcg total) by mouth daily before breakfast. (Patient not taking: Reported on 09/18/2024) 30 capsule 5   Omega-3 1000 MG CAPS Take 1 capsule by mouth daily. (Patient not taking: Reported on 09/18/2024)     No current facility-administered medications for this visit.    Allergies as of 09/18/2024 - Review Complete 09/18/2024  Allergen Reaction Noted   Darvon [propoxyphene]  02/28/2016   Morphine and codeine  02/28/2016   Percocet [oxycodone-acetaminophen ]  02/28/2016   Vicodin [hydrocodone-acetaminophen ]  02/28/2016    Family History  Problem Relation Age of Onset   Cancer Mother    Breast cancer Mother    Liver cancer Mother    Diabetes Father    Hypertension Father    Hypertension Sister    Diabetes Brother    Heart disease Brother    Breast cancer Niece     Social History   Tobacco Use   Smoking status: Never   Smokeless tobacco: Never  Vaping Use   Vaping status: Never Used   Substance Use Topics   Alcohol use: Yes    Alcohol/week: 0.0 standard drinks of alcohol    Comment: occasionally a glass of red wine   Drug use: No     Review of Systems:    Constitutional: No fever, chills Cardiovascular: No chest pain Respiratory: No SOB Gastrointestinal: See HPI and otherwise negative Hematologic: No bleeding     Physical Exam:  Vital signs: BP 112/72   Pulse 60   Ht 5' 4 (1.626 m)   Wt 130 lb (59 kg)   BMI 22.31 kg/m   Wt Readings from Last 3 Encounters:  09/18/24 130 lb (59 kg)  07/20/24 131 lb 9.6 oz (  59.7 kg)  06/08/24 132 lb (59.9 kg)    Constitutional: Pleasant female in NAD, alert and cooperative Head:  Normocephalic and atraumatic.  Eyes: No scleral icterus.  Respiratory: Respirations even and unlabored. Lungs clear to auscultation bilaterally.  No wheezes, crackles, or rhonchi.  Cardiovascular:  Regular rate and rhythm. No murmurs. No peripheral edema. Gastrointestinal:  Soft, nondistended, tender to palpation of RUQ. No rebound or guarding. Normal bowel sounds. No appreciable masses or hepatomegaly. Rectal:  Not performed.  Neurologic:  Alert and oriented x4;  grossly normal neurologically.  Skin:   Dry and intact without significant lesions or rashes. Psychiatric: Oriented to person, place and time. Demonstrates good judgement and reason without abnormal affect or behaviors.   RELEVANT LABS AND IMAGING: CBC    Component Value Date/Time   WBC 5.5 01/07/2024 1017   RBC 4.48 01/07/2024 1017   HGB 14.2 01/07/2024 1017   HCT 43.0 01/07/2024 1017   PLT 182.0 01/07/2024 1017   MCV 96.1 01/07/2024 1017   MCV 92.6 01/10/2017 1059   MCH 31.4 05/03/2019 1405   MCHC 33.0 01/07/2024 1017   RDW 13.9 01/07/2024 1017    CMP     Component Value Date/Time   NA 142 01/07/2024 1017   NA 142 01/02/2017 1235   K 4.8 01/07/2024 1017   CL 105 01/07/2024 1017   CO2 32 01/07/2024 1017   GLUCOSE 89 01/07/2024 1017   BUN 11 01/07/2024 1017    BUN 15 01/02/2017 1235   CREATININE 0.95 01/07/2024 1017   CREATININE 1.31 (H) 02/22/2015 1154   CALCIUM  9.6 01/07/2024 1017   PROT 7.0 01/07/2024 1017   PROT 6.5 01/02/2017 1235   ALBUMIN 4.4 01/07/2024 1017   ALBUMIN 4.4 01/02/2017 1235   AST 26 01/07/2024 1017   ALT 16 01/07/2024 1017   ALKPHOS 96 01/07/2024 1017   BILITOT 0.6 01/07/2024 1017   BILITOT 0.5 01/02/2017 1235   GFRNONAA 48 (L) 05/03/2019 1405   GFRNONAA 50 (L) 08/11/2014 1452   GFRAA 55 (L) 05/03/2019 1405   GFRAA 57 (L) 08/11/2014 1452     Assessment/Plan:   Right-sided abdominal pain Constipation Patient seen today for follow-up.  Continues to have intermittent right sided abdominal pain.  Can have radiation to her right scapula/back.  Has noticed she has less pain when she is having regular bowel movements, such as when she takes Linzess .  Linzess  has been very helpful for her constipation but she request that we send it through [facility] to help lower the cost. Right-sided abdominal pain can be improved with Salonpas patches.  She does have some tenderness to palpation of the RUQ on exam today.  She has had a CT, RUQ US , and HIDA scan back in 2020 12/2020.  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  As it has been a few years since she had any imaging in this area, we will update an RUQ US  to reevaluate the gallbladder.  Would also like to have her follow-up with Dr. Nandigam to discuss possible repeat colonoscopy in further evaluation for pain.  - Send Linzess  145 mcg daily - Order RUQ US  - Follow up for consideration of repeat colonoscopy   Camie Furbish, PA-C [facility] 09/18/2024, 10:17 AM  Patient Care Team: Rollene Almarie LABOR, MD as PCP - General (Internal Medicine) Ladona Tanny Harnack, MD as Consulting Physician (Cardiology) [facility] as Consulting Physician  (  Ophthalmology) Nandigam, Kavitha V, MD as Consulting Physician (Gastroenterology) Joane Artist RAMAN, MD as Consulting Physician (Sports Medicine) Corbin Mabel NOVAK, MD as Consulting Physician (Ophthalmology)

## 2024-09-22 DIAGNOSIS — Z23 Encounter for immunization: Secondary | ICD-10-CM | POA: Diagnosis not present

## 2024-09-25 ENCOUNTER — Ambulatory Visit: Payer: Self-pay | Admitting: Gastroenterology

## 2024-09-25 ENCOUNTER — Ambulatory Visit (HOSPITAL_COMMUNITY)
Admission: RE | Admit: 2024-09-25 | Discharge: 2024-09-25 | Disposition: A | Discharge status: Home / Self Care | Source: Ambulatory Visit | Attending: Gastroenterology | Admitting: Gastroenterology

## 2024-09-25 DIAGNOSIS — R109 Unspecified abdominal pain: Secondary | ICD-10-CM | POA: Insufficient documentation

## 2024-10-05 ENCOUNTER — Other Ambulatory Visit: Payer: Self-pay | Admitting: Family Medicine

## 2024-10-05 NOTE — Telephone Encounter (Signed)
 Last OV 06/08/24 Next OV not scheduled  Last refill 08/26/23 Qty #90/3

## 2024-12-10 ENCOUNTER — Encounter: Payer: Self-pay | Admitting: Gastroenterology

## 2024-12-10 ENCOUNTER — Ambulatory Visit: Admitting: Gastroenterology

## 2024-12-10 VITALS — BP 128/82 | HR 80 | Ht 64.0 in | Wt 131.0 lb

## 2024-12-10 DIAGNOSIS — K581 Irritable bowel syndrome with constipation: Secondary | ICD-10-CM

## 2024-12-10 DIAGNOSIS — R1031 Right lower quadrant pain: Secondary | ICD-10-CM

## 2024-12-10 DIAGNOSIS — R252 Cramp and spasm: Secondary | ICD-10-CM

## 2024-12-10 MED ORDER — DICYCLOMINE HCL 10 MG PO CAPS: ORAL_CAPSULE | ORAL | 3 refills | Status: AC

## 2024-12-10 NOTE — Progress Notes (Signed)
 Wanda Rivera    [id number]    02/07/1941  Primary Care Physician:Crawford, Almarie LABOR, MD  Referring Physician: Rollene Almarie LABOR, MD [address] [place],  [place] [address]   Chief complaint:  LLQ pain  Discussed the use of AI scribe software for clinical note transcription with the patient, who gave verbal consent to proceed.  History of Present Illness Wanda Rivera is an 83 year old female with irritable bowel syndrome with constipation who presents for management of her symptoms.  Abdominal pain and bowel habits - Abdominal pain primarily localized to the right side - Bowel movements sometimes pasty, described as 'peanut butter' - No diarrhea or cramping with Linzess  use - Regular use of Linzess  except when leaving the house - Discontinued Miralax  after starting Linzess  - Regular consumption of prune juice and fruit juice  Pharmacologic management and medication tolerability - Linzess  copay is $125 for a three-month supply, paid via credit card - Obtains prescriptions from a [place] pharmacy due to lower cost compared to [facility] - Takes dicyclomine  twice daily for abdominal pain, with onset of relief in approximately 30 minutes - Occasional use of Tylenol  for pain  Neuromuscular symptoms - Occasional 'funny sticking pain' without associated rash - History of shingles vaccination - Nocturnal cramping ('Charlie horse') in the right leg    Outpatient Encounter Medications as of 12/10/2024  Medication Sig   BIOTIN PO Take 1,000 mcg by mouth daily.    brimonidine (ALPHAGAN) 0.2 % ophthalmic solution Place 1 drop into the right eye in the morning and at bedtime.   Calcium  Carbonate (CALCIUM  600 PO) Take 1 tablet by mouth in the morning and at bedtime.   Cyanocobalamin  (VITAMIN B 12 PO) Take 500 mg by mouth daily.   diclofenac  Sodium (VOLTAREN ) 1 % GEL Apply 4 g topically 4 (four) times daily.   dicyclomine  (BENTYL ) 10 MG capsule TAKE 1  CAPSULE TWICE DAILY AS NEEDED FOR SPASMS   dorzolamide-timolol (COSOPT) 22.3-6.8 MG/ML ophthalmic solution Place 1 drop into both eyes 2 (two) times daily.   DULoxetine  (CYMBALTA ) 20 MG capsule TAKE 1 CAPSULE EVERY DAY   latanoprost (XALATAN) 0.005 % ophthalmic solution Place 1 drop into both eyes at bedtime.   Latanoprostene Bunod (VYZULTA OP) Place 1 drop into the right eye daily.   lidocaine  (LIDODERM ) 5 % Place 1 patch onto the skin daily. Remove & Discard patch within 12 hours or as directed by MD   linaclotide  (LINZESS ) 145 MCG CAPS capsule Take 1 capsule (145 mcg total) by mouth daily before breakfast.   magnesium oxide (MAG-OX) 400 (240 Mg) MG tablet Take 400 mg by mouth daily.   meclizine  (ANTIVERT ) 12.5 MG tablet Take 12.5 mg by mouth 3 (three) times daily as needed for dizziness.    Multiple Vitamin (ONE-A-DAY ESSENTIAL PO) Take by mouth daily.   Omega-3 Fatty Acids (FISH OIL) 500 MG CAPS Take by mouth.   omeprazole  (PRILOSEC) 40 MG capsule TAKE 1 CAPSULE TWICE DAILY   polyethylene glycol (MIRALAX  / GLYCOLAX ) 17 g packet Take 17 g by mouth as needed.   rosuvastatin  (CRESTOR ) 10 MG tablet TAKE 1 TABLET EVERY DAY   Turmeric 500 MG TABS Take 1 tablet by mouth daily.    Omega-3 1000 MG CAPS Take 1 capsule by mouth daily. (Patient not taking: Reported on 12/10/2024)   No facility-administered encounter medications on file as of 12/10/2024.    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,hands   Breast cancer (HCC)    1982, recurrence in 1985   Cataract    Bil   Glaucoma    Bil   Osteoporosis     Past Surgical History:  Procedure Laterality Date   ABDOMINAL HYSTERECTOMY     BREAST SURGERY      double masectomy for breast cancer   eye surgery Bilateral 06/05/2017   for Glaucoma    Family History  Problem Relation Age of Onset   Cancer Mother    Breast cancer Mother    Liver cancer Mother    Diabetes Father    Hypertension Father    Hypertension Sister    Diabetes Brother    Heart disease Brother    Breast cancer Niece     Social History   Socioeconomic History   Marital status: Divorced    Spouse name: Not on file   Number of children: 1   Years of education: Not on file   Highest education level: Not on file  Occupational History   Occupation: retired  Tobacco Use   Smoking status: Never   Smokeless tobacco: Never  Vaping Use   Vaping status: Never Used  Substance and Sexual Activity   Alcohol use: Yes    Alcohol/week: 0.0 standard drinks of alcohol    Comment: occasionally a glass of red wine   Drug use: No   Sexual activity: Never  Other Topics Concern   Not on file  Social History Narrative   Lives alone.   Social Drivers of Health   Tobacco Use: Low Risk (12/10/2024)   Patient History    Smoking Tobacco Use: Never    Smokeless Tobacco Use: Never    Passive Exposure: Not on file  Financial Resource Strain: Low Risk (01/30/2024)   Overall Financial Resource Strain (CARDIA)    Difficulty of Paying Living Expenses: Not hard at all  Food Insecurity: No Food Insecurity (01/30/2024)   Hunger Vital Sign    Worried About Running Out of Food in the Last Year: Never true    Ran Out of Food in the Last Year: Never true  Transportation Needs: No Transportation Needs (01/30/2024)   PRAPARE - Administrator, Civil Service (Medical): No    Lack of Transportation (Non-Medical): No  Physical Activity: Insufficiently Active (01/30/2024)   Exercise Vital Sign    Days of Exercise per Week: 3 days    Minutes of Exercise per Session: 30 min  Stress: No Stress Concern Present (01/30/2024)   [employer] of Occupational Health - Occupational Stress  Questionnaire    Feeling of Stress : Not at all  Social Connections: Moderately Integrated (01/30/2024)   Social Connection and Isolation Panel    Frequency of Communication with Friends and Family: More than three times a week    Frequency of Social Gatherings with Friends and Family: Three times a week    Attends Religious Services: More than 4 times per year    Active Member of Clubs or Organizations: Yes    Attends Banker Meetings: Never    Marital Status: Divorced  Intimate Partner Violence: Patient Unable To Answer (01/30/2024)   Humiliation, Afraid, Rape, and Kick questionnaire    Fear of  Current or Ex-Partner: Patient unable to answer    Emotionally Abused: Patient unable to answer    Physically Abused: Patient unable to answer    Sexually Abused: Patient unable to answer  Depression (PHQ2-9): Low Risk (01/30/2024)   Depression (PHQ2-9)    PHQ-2 Score: 0  Alcohol Screen: Low Risk (01/30/2024)   Alcohol Screen    Last Alcohol Screening Score (AUDIT): 0  Housing: Unknown (01/30/2024)   Housing Stability Vital Sign    Unable to Pay for Housing in the Last Year: No    Number of Times Moved in the Last Year: Not on file    Homeless in the Last Year: No  Utilities: Not At Risk (01/30/2024)   AHC Utilities    Threatened with loss of utilities: No  Health Literacy: Adequate Health Literacy (01/30/2024)   B1300 Health Literacy    Frequency of need for help with medical instructions: Never      Review of systems: All other review of systems negative except as mentioned in the HPI.   Physical Exam: Vitals:   12/10/24 0955  BP: 128/82  Pulse: 80   Body mass index is 22.49 kg/m. Gen:      No acute distress HEENT:  sclera anicteric CV: s1s2 rrr, no murmur Lungs: B/l clear. Abd:      soft, non-tender; no palpable masses, no distension Ext:    No edema Neuro: alert and oriented x 3 Psych: normal mood and affect  Data Reviewed:  Reviewed labs, radiology  imaging, old records and pertinent past GI work up    Assessment & Plan Irritable bowel syndrome with constipation Chronic irritable bowel syndrome with constipation, primarily affecting the right side. Current treatment with Linzess  is effective but results in pasty stools. Dicyclomine  is used daily for cramping, but advised to use it as needed to avoid constipation. Discussed cost-effective options for Linzess , with a preference for a three-month supply due to better financial management with her credit card. - Provided samples of Linzess  72 mcg to assess effectiveness. - Continue Linzess  as prescribed, with potential dose adjustment based on sample response. - Use dicyclomine  as needed for cramping, up to twice daily if necessary. - Encouraged hydration and dietary fiber intake.  Muscle cramps Intermittent muscle cramps, possibly related to hydration status. Potassium levels are normal, and potassium supplementation is not recommended due to potential risks of hyperkalemia. - Encouraged hydration with at least eight cups of water daily. - Advised against potassium supplementation; recommended dietary sources of potassium such as bananas.       The patient was provided an opportunity to ask questions and all were answered. The patient agreed with the plan and demonstrated an understanding of the instructions.  LOIS Wilkie Mcgee , MD    CC: Rollene Almarie LABOR, MD

## 2024-12-10 NOTE — Patient Instructions (Addendum)
 VISIT SUMMARY:  Today, we discussed the management of your irritable bowel syndrome with constipation and addressed your muscle cramps. We reviewed your current medications and made some recommendations to help manage your symptoms more effectively.  YOUR PLAN:  IRRITABLE BOWEL SYNDROME WITH CONSTIPATION: You have chronic irritable bowel syndrome with constipation, primarily affecting the right side. Your current treatment with Linzess  is effective but results in pasty stools. -Continue taking Linzess  as prescribed. We provided samples of Linzess  72 mcg to see if this dose works better for you. We may adjust the dose based on your response to the samples. -Use dicyclomine  as needed for cramping, up to twice daily if necessary. Try to avoid using it regularly to prevent constipation. -Keep hydrated and maintain a diet high in fiber. Continue drinking prune juice and fruit juice as you have been.   We have given you samples of the following medication to take: Linzess  72 mcg- once daily. Please let us  know which mg of Linzess  works better for you. We can then send refill to your pharmacy based of what works best for you.   MUSCLE CRAMPS: You have intermittent muscle cramps, possibly related to hydration status. Your potassium levels are normal, so potassium supplements are not needed. -Drink at least eight cups of water daily to stay hydrated. -Avoid potassium supplements, but include dietary sources of potassium like bananas in your diet.  We have sent the following medications to your pharmacy for you to pick up at your convenience: Dicyclomine   Thank you for choosing me and [facility].  Dr. Nandigam

## 2024-12-14 ENCOUNTER — Encounter: Payer: Self-pay | Admitting: Gastroenterology

## 2025-02-02 ENCOUNTER — Ambulatory Visit: Payer: Medicare Other

## 2025-02-04 ENCOUNTER — Ambulatory Visit: Payer: Medicare Other

## 2025-02-05 ENCOUNTER — Ambulatory Visit

## 2025-02-05 VITALS — BP 119/80 | HR 58 | Ht 64.0 in | Wt 134.4 lb

## 2025-02-05 DIAGNOSIS — Z0000 Encounter for general adult medical examination without abnormal findings: Secondary | ICD-10-CM

## 2025-02-05 NOTE — Patient Instructions (Signed)
 Ms. Rede,  Thank you for taking the time for your Medicare Wellness Visit. I appreciate your continued commitment to your health goals. Please review the care plan we discussed, and feel free to reach out if I can assist you further.  Please note that Annual Wellness Visits do not include a physical exam. Some assessments may be limited, especially if the visit was conducted virtually. If needed, we may recommend an in-person follow-up with your provider.  Ongoing Care Seeing your primary care provider every 3 to 6 months helps us  monitor your health and provide consistent, personalized care. Keep up the good work.  Referrals If a referral was made during today's visit and you haven't received any updates within two weeks, please contact the referred provider directly to check on the status.  Recommended Screenings:  Health Maintenance  Topic Date Due   COVID-19 Vaccine (9 - 2025-26 season) 03/22/2025   DTaP/Tdap/Td vaccine (2 - Td or Tdap) 12/08/2025   Medicare Annual Wellness Visit  02/05/2026   Pneumococcal Vaccine for age over 68  Completed   Flu Shot  Completed   Osteoporosis screening with Bone Density Scan  Completed   Zoster (Shingles) Vaccine  Completed   Meningitis B Vaccine  Aged Out   Hepatitis C Screening  Discontinued       05/18/2024   12:44 PM  Advanced Directives  Does Patient Have a Medical Advance Directive? No  Would patient like information on creating a medical advance directive? No - Patient declined    Vision: Annual vision screenings are recommended for early detection of glaucoma, cataracts, and diabetic retinopathy. These exams can also reveal signs of chronic conditions such as diabetes and high blood pressure.  Dental: Annual dental screenings help detect early signs of oral cancer, gum disease, and other conditions linked to overall health, including heart disease and diabetes.  Please see the attached documents for additional preventive care  recommendations.

## 2025-02-05 NOTE — Progress Notes (Signed)
 "  Chief Complaint  Patient presents with   Medicare Wellness     Subjective:   Wanda Rivera is a 84 y.o. female who presents for a Medicare Annual Wellness Visit.  Visit info / Clinical Intake: Medicare Wellness Visit Type:: Subsequent Annual Wellness Visit Persons participating in visit and providing information:: patient Medicare Wellness Visit Mode:: In-person (required for WTM) Interpreter Needed?: No Pre-visit prep was completed: yes AWV questionnaire completed by patient prior to visit?: no Living arrangements:: (!) lives alone Patient's Overall Health Status Rating: good Typical amount of pain: some (lower back) Does pain affect daily life?: (!) yes Are you currently prescribed opioids?: no  Dietary Habits and Nutritional Risks How many meals a day?: 2 (drinks Ensure) Eats fruit and vegetables daily?: yes Most meals are obtained by: preparing own meals; having others provide food (50/50-per pt/son brings her food sometimes) In the last 2 weeks, have you had any of the following?: none Diabetic:: no  Functional Status Activities of Daily Living (to include ambulation/medication): Independent Ambulation: Independent Medication Administration: Independent Home Management (perform basic housework or laundry): Independent Manage your own finances?: yes Primary transportation is: driving Concerns about vision?: no *vision screening is required for WTM* (has glaucoma) Concerns about hearing?: no  Fall Screening Falls in the past year?: 0 Number of falls in past year: 0 Was there an injury with Fall?: 0 Fall Risk Category Calculator: 0 Patient Fall Risk Level: Low Fall Risk  Fall Risk Patient at Risk for Falls Due to: No Fall Risks Fall risk Follow up: Falls evaluation completed; Falls prevention discussed  Home and Transportation Safety: All rugs have non-skid backing?: N/A, no rugs All stairs or steps have railings?: yes (lives a townhouse/13 steps) Grab bars  in the bathtub or shower?: (!) no Have non-skid surface in bathtub or shower?: (!) no (wears shower shoes) Good home lighting?: yes Regular seat belt use?: yes Hospital stays in the last year:: no  Cognitive Assessment Difficulty concentrating, remembering, or making decisions? : yes (remembering) Will 6CIT or Mini Cog be Completed: yes What year is it?: 0 points What month is it?: 0 points Give patient an address phrase to remember (5 components): [address], [place] About what time is it?: 0 points Count backwards from 20 to 1: 0 points Say the months of the year in reverse: 0 points  Advance Directives (For Healthcare) Does Patient Have a Medical Advance Directive?: No Would patient like information on creating a medical advance directive?: No - Patient declined    Allergies (verified) Darvon [propoxyphene], Morphine and codeine, Percocet [oxycodone-acetaminophen ], and Vicodin [hydrocodone-acetaminophen ]   Current Medications (verified) Outpatient Encounter Medications as of 02/05/2025  Medication Sig   acetaminophen  (TYLENOL ) 500 MG tablet Take 500 mg by mouth every 8 (eight) hours as needed for moderate pain (pain score 4-6).   BIOTIN PO Take 1,000 mcg by mouth daily.    brimonidine (ALPHAGAN) 0.2 % ophthalmic solution Place 1 drop into the right eye in the morning and at bedtime.   Calcium  Carbonate (CALCIUM  600 PO) Take 1 tablet by mouth in the morning and at bedtime.   Cyanocobalamin  (VITAMIN B 12 PO) Take 500 mg by mouth daily.   diclofenac  Sodium (VOLTAREN ) 1 % GEL Apply 4 g topically 4 (four) times daily.   dicyclomine  (BENTYL ) 10 MG capsule TAKE 1 CAPSULE TWICE DAILY AS NEEDED FOR SPASMS   dorzolamide-timolol (COSOPT) 22.3-6.8 MG/ML ophthalmic solution Place 1 drop into both eyes 2 (two) times daily.   DULoxetine  (  CYMBALTA ) 20 MG capsule TAKE 1 CAPSULE EVERY DAY   latanoprost (XALATAN) 0.005 % ophthalmic solution Place 1 drop into both eyes at bedtime.    Latanoprostene Bunod (VYZULTA OP) Place 1 drop into the right eye daily.   lidocaine  (LIDODERM ) 5 % Place 1 patch onto the skin daily. Remove & Discard patch within 12 hours or as directed by MD   linaclotide  (LINZESS ) 145 MCG CAPS capsule Take 1 capsule (145 mcg total) by mouth daily before breakfast.   magnesium oxide (MAG-OX) 400 (240 Mg) MG tablet Take 400 mg by mouth daily.   meclizine  (ANTIVERT ) 12.5 MG tablet Take 12.5 mg by mouth 3 (three) times daily as needed for dizziness.    Multiple Vitamin (ONE-A-DAY ESSENTIAL PO) Take by mouth daily.   Omega-3 Fatty Acids (FISH OIL) 500 MG CAPS Take by mouth.   omeprazole  (PRILOSEC) 40 MG capsule TAKE 1 CAPSULE TWICE DAILY   rosuvastatin  (CRESTOR ) 10 MG tablet TAKE 1 TABLET EVERY DAY   Turmeric 500 MG TABS Take 1 tablet by mouth daily.    Omega-3 1000 MG CAPS Take 1 capsule by mouth daily. (Patient not taking: Reported on 02/05/2025)   polyethylene glycol (MIRALAX  / GLYCOLAX ) 17 g packet Take 17 g by mouth as needed. (Patient not taking: Reported on 02/05/2025)   No facility-administered encounter medications on file as of 02/05/2025.    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,hands   Breast cancer (HCC)    1982, recurrence in 1985   Cataract    Bil   Glaucoma    Bil   Osteoporosis    Past Surgical History:  Procedure Laterality Date   ABDOMINAL HYSTERECTOMY     BREAST SURGERY     double masectomy for breast cancer   eye surgery Bilateral 06/05/2017   for Glaucoma   Family History  Problem Relation Age of Onset   Cancer Mother    Breast cancer Mother    Liver cancer Mother    Diabetes Father    Hypertension Father    Hypertension Sister    Diabetes Brother    Heart disease Brother    Breast cancer Niece    Social History   Occupational History   Occupation: retired  Tobacco Use   Smoking status: Never   Smokeless tobacco: Never  Vaping Use   Vaping status: Never Used  Substance and  Sexual Activity   Alcohol use: Yes    Alcohol/week: 0.0 standard drinks of alcohol    Comment: occasionally a glass of red wine   Drug use: No   Sexual activity: Never   Tobacco Counseling Counseling given: Not Answered  SDOH Screenings   Food Insecurity: No Food Insecurity (02/05/2025)  Housing: Low Risk (02/05/2025)  Transportation Needs: No Transportation Needs (02/05/2025)  Utilities: Not At Risk (02/05/2025)  Alcohol Screen: Low Risk (01/30/2024)  Depression (PHQ2-9): Low Risk (02/05/2025)  Financial Resource Strain: Low Risk (01/30/2024)  Physical Activity: Insufficiently Active (02/05/2025)  Social Connections: Moderately Integrated (02/05/2025)  Stress: No Stress Concern Present (02/05/2025)  Tobacco Use: Low Risk (02/05/2025)  Health Literacy: Adequate Health Literacy (02/05/2025)   See flowsheets for full screening details  Depression Screen PHQ 2 & 9 Depression Scale- Over the past 2 weeks, how often have you been bothered by any of the following problems? Little interest or pleasure in doing things: 0 Feeling down, depressed, or hopeless (PHQ Adolescent also includes...irritable): 1 (feeling down sometimes) PHQ-2 Total Score: 1  Trouble falling or staying asleep, or sleeping too much: 0 Feeling tired or having little energy: 1 Poor appetite or overeating (PHQ Adolescent also includes...weight loss): 1 (poor appetite sometimes-per pt) Feeling bad about yourself - or that you are a failure or have let yourself or your family down: 0 Trouble concentrating on things, such as reading the newspaper or watching television (PHQ Adolescent also includes...like school work): 0 Moving or speaking so slowly that other people could have noticed. Or the opposite - being so fidgety or restless that you have been moving around a lot more than usual: 0 Thoughts that you would be better off dead, or of hurting yourself in some way: 0 PHQ-9 Total Score: 3 If you checked off any problems, how difficult have  these problems made it for you to do your work, take care of things at home, or get along with other people?: Not difficult at all  Depression Treatment Depression Interventions/Treatment : EYV7-0 Score <4 Follow-up Not Indicated     Goals Addressed             This Visit's Progress    Patient Stated   On track    Continue current lifestyle.             Objective:    Today's Vitals   02/05/25 0842  BP: 119/80  Pulse: (!) 58  SpO2: 99%  Weight: 134 lb 6.4 oz (61 kg)  Height: 5' 4 (1.626 m)   Body mass index is 23.07 kg/m.  Hearing/Vision screen Hearing Screening - Comments:: Denies hearing difficulties   Vision Screening - Comments:: Has glaucoma/no eyeglasses/UTD/[facility] Immunizations and Health Maintenance Health Maintenance  Topic Date Due   COVID-19 Vaccine (9 - 2025-26 season) 03/22/2025   DTaP/Tdap/Td (2 - Td or Tdap) 12/08/2025   Medicare Annual Wellness (AWV)  02/05/2026   Pneumococcal Vaccine: 50+ Years  Completed   Influenza Vaccine  Completed   Bone Density Scan  Completed   Zoster Vaccines- Shingrix  Completed   Meningococcal B Vaccine  Aged Out   Hepatitis C Screening  Discontinued        Assessment/Plan:  This is a routine wellness examination for Wanda Rivera.  Patient Care Team: Rollene Almarie LABOR, MD as PCP - General (Internal Medicine) Ladona Heinz, MD as Consulting Physician (Cardiology) [facility] as Consulting Physician (Ophthalmology) Shila Gustav GAILS, MD as Consulting Physician (Gastroenterology) Joane Artist RAMAN, MD as Consulting Physician (Sports Medicine) Corbin Mabel NOVAK, MD as Consulting Physician (Ophthalmology)  I have personally reviewed and noted the following in the patients chart:   Medical and social history Use of alcohol, tobacco or illicit drugs  Current medications and supplements including opioid prescriptions. Functional ability and status Nutritional status Physical  activity Advanced directives List of other physicians Hospitalizations, surgeries, and ER visits in previous 12 months Vitals Screenings to include cognitive, depression, and falls Referrals and appointments  No orders of the defined types were placed in this encounter.  In addition, I have reviewed and discussed with patient certain preventive protocols, quality metrics, and best practice recommendations. A written personalized care plan for preventive services as well as general preventive health recommendations were provided to patient.   Mellonie Guess L Jaylan Hinojosa, CMA   02/05/2025   Return in 1 year (on 02/05/2026).  After Visit Summary: (In Person-Printed) AVS printed and given to the patient  Nurse Notes: No voiced or noted concerns at this time "

## 2025-02-16 ENCOUNTER — Ambulatory Visit: Admitting: Internal Medicine

## 2025-07-20 ENCOUNTER — Encounter: Admitting: Adult Health
